# Patient Record
Sex: Male | Born: 1955 | Race: White | Hispanic: No | Smoking: Former smoker
Health system: Southern US, Community
[De-identification: ages and names within clinical notes are randomized; demographics above are authoritative.]

## PROBLEM LIST (undated history)

## (undated) DIAGNOSIS — I2510 Atherosclerotic heart disease of native coronary artery without angina pectoris: Secondary | ICD-10-CM

## (undated) DIAGNOSIS — I69398 Other sequelae of cerebral infarction: Secondary | ICD-10-CM

## (undated) DIAGNOSIS — E785 Hyperlipidemia, unspecified: Secondary | ICD-10-CM

## (undated) DIAGNOSIS — I10 Essential (primary) hypertension: Secondary | ICD-10-CM

## (undated) DIAGNOSIS — R4701 Aphasia: Secondary | ICD-10-CM

## (undated) DIAGNOSIS — I639 Cerebral infarction, unspecified: Secondary | ICD-10-CM

## (undated) DIAGNOSIS — J449 Chronic obstructive pulmonary disease, unspecified: Secondary | ICD-10-CM

## (undated) DIAGNOSIS — I69390 Apraxia following cerebral infarction: Secondary | ICD-10-CM

## (undated) DIAGNOSIS — H53469 Homonymous bilateral field defects, unspecified side: Secondary | ICD-10-CM

## (undated) HISTORY — DX: Hyperlipidemia, unspecified: E78.5

## (undated) HISTORY — PX: APPENDECTOMY: SHX54

## (undated) HISTORY — PX: LUMBAR SPINE SURGERY: SHX701

---

## 1999-05-19 DIAGNOSIS — I2510 Atherosclerotic heart disease of native coronary artery without angina pectoris: Secondary | ICD-10-CM

## 1999-05-19 HISTORY — DX: Atherosclerotic heart disease of native coronary artery without angina pectoris: I25.10

## 1999-05-19 HISTORY — PX: CORONARY ARTERY BYPASS GRAFT: SHX141

## 2012-11-09 ENCOUNTER — Ambulatory Visit: Payer: Medicare Other | Attending: Internal Medicine | Admitting: Speech Pathology

## 2012-11-09 DIAGNOSIS — IMO0001 Reserved for inherently not codable concepts without codable children: Secondary | ICD-10-CM | POA: Insufficient documentation

## 2012-11-09 DIAGNOSIS — R4701 Aphasia: Secondary | ICD-10-CM | POA: Insufficient documentation

## 2012-11-14 ENCOUNTER — Ambulatory Visit: Payer: Medicare Other

## 2012-11-16 ENCOUNTER — Ambulatory Visit: Payer: Medicare Other | Attending: Internal Medicine

## 2012-11-16 DIAGNOSIS — R4701 Aphasia: Secondary | ICD-10-CM | POA: Insufficient documentation

## 2012-11-16 DIAGNOSIS — IMO0001 Reserved for inherently not codable concepts without codable children: Secondary | ICD-10-CM | POA: Insufficient documentation

## 2012-11-22 ENCOUNTER — Ambulatory Visit: Payer: Medicare Other

## 2012-11-29 ENCOUNTER — Ambulatory Visit: Payer: Medicare Other

## 2012-12-01 ENCOUNTER — Ambulatory Visit: Payer: Medicare Other

## 2012-12-05 ENCOUNTER — Ambulatory Visit: Payer: Medicare Other

## 2012-12-07 ENCOUNTER — Ambulatory Visit: Payer: Medicare Other

## 2012-12-12 ENCOUNTER — Ambulatory Visit: Payer: Medicare Other | Admitting: Speech Pathology

## 2012-12-14 ENCOUNTER — Ambulatory Visit: Payer: Medicare Other | Admitting: Speech Pathology

## 2012-12-19 ENCOUNTER — Ambulatory Visit: Payer: Medicare Other | Admitting: Occupational Therapy

## 2012-12-19 ENCOUNTER — Ambulatory Visit: Payer: Medicare Other | Admitting: Physical Therapy

## 2012-12-19 ENCOUNTER — Ambulatory Visit: Payer: Medicare Other | Attending: Internal Medicine

## 2012-12-19 DIAGNOSIS — IMO0001 Reserved for inherently not codable concepts without codable children: Secondary | ICD-10-CM | POA: Insufficient documentation

## 2012-12-19 DIAGNOSIS — R4701 Aphasia: Secondary | ICD-10-CM | POA: Insufficient documentation

## 2012-12-21 ENCOUNTER — Ambulatory Visit: Payer: Medicare Other

## 2012-12-26 ENCOUNTER — Ambulatory Visit: Payer: Medicare Other

## 2012-12-26 ENCOUNTER — Ambulatory Visit: Payer: Medicare Other | Admitting: Occupational Therapy

## 2012-12-26 ENCOUNTER — Ambulatory Visit: Payer: Medicare Other | Admitting: Physical Therapy

## 2012-12-28 ENCOUNTER — Ambulatory Visit: Payer: Medicare Other

## 2012-12-28 ENCOUNTER — Ambulatory Visit: Payer: Medicare Other | Admitting: Occupational Therapy

## 2013-01-02 ENCOUNTER — Encounter: Payer: Medicare Other | Admitting: Occupational Therapy

## 2013-01-02 ENCOUNTER — Encounter: Payer: Medicare Other | Admitting: Speech Pathology

## 2013-01-04 ENCOUNTER — Encounter: Payer: Medicare Other | Admitting: Occupational Therapy

## 2013-01-04 ENCOUNTER — Ambulatory Visit: Payer: Medicare Other | Admitting: Physical Therapy

## 2013-01-04 ENCOUNTER — Encounter: Payer: Medicare Other | Admitting: Speech Pathology

## 2013-01-09 ENCOUNTER — Encounter: Payer: Medicare Other | Admitting: Speech Pathology

## 2013-01-09 ENCOUNTER — Ambulatory Visit: Payer: Medicare Other | Admitting: Physical Therapy

## 2013-01-10 ENCOUNTER — Encounter: Payer: Medicare Other | Admitting: Occupational Therapy

## 2013-01-11 ENCOUNTER — Encounter: Payer: Medicare Other | Admitting: Speech Pathology

## 2013-01-11 ENCOUNTER — Ambulatory Visit: Payer: Medicare Other | Admitting: Physical Therapy

## 2013-01-12 ENCOUNTER — Encounter: Payer: Medicare Other | Admitting: Occupational Therapy

## 2013-01-17 ENCOUNTER — Encounter: Payer: Medicare Other | Admitting: Occupational Therapy

## 2013-01-19 ENCOUNTER — Encounter: Payer: Medicare Other | Admitting: Occupational Therapy

## 2013-01-24 ENCOUNTER — Encounter: Payer: Medicare Other | Admitting: Occupational Therapy

## 2013-01-26 ENCOUNTER — Encounter: Payer: Medicare Other | Admitting: Occupational Therapy

## 2013-01-31 ENCOUNTER — Encounter: Payer: Medicare Other | Admitting: Occupational Therapy

## 2013-02-02 ENCOUNTER — Encounter: Payer: Medicare Other | Admitting: Occupational Therapy

## 2013-11-23 ENCOUNTER — Ambulatory Visit: Payer: Medicare Other | Attending: Internal Medicine | Admitting: Occupational Therapy

## 2013-11-23 DIAGNOSIS — IMO0001 Reserved for inherently not codable concepts without codable children: Secondary | ICD-10-CM | POA: Diagnosis not present

## 2013-11-23 DIAGNOSIS — M25649 Stiffness of unspecified hand, not elsewhere classified: Secondary | ICD-10-CM | POA: Diagnosis not present

## 2013-11-23 DIAGNOSIS — I69998 Other sequelae following unspecified cerebrovascular disease: Secondary | ICD-10-CM | POA: Insufficient documentation

## 2013-11-23 DIAGNOSIS — M62838 Other muscle spasm: Secondary | ICD-10-CM | POA: Diagnosis not present

## 2013-11-23 DIAGNOSIS — I69920 Aphasia following unspecified cerebrovascular disease: Secondary | ICD-10-CM | POA: Insufficient documentation

## 2013-11-28 ENCOUNTER — Ambulatory Visit: Payer: Medicare Other | Admitting: Occupational Therapy

## 2014-06-06 ENCOUNTER — Ambulatory Visit: Payer: Medicare Other | Attending: Occupational Therapy | Admitting: Occupational Therapy

## 2014-06-20 ENCOUNTER — Ambulatory Visit: Payer: Medicare Other | Attending: Internal Medicine | Admitting: Occupational Therapy

## 2014-06-20 DIAGNOSIS — R258 Other abnormal involuntary movements: Secondary | ICD-10-CM | POA: Diagnosis present

## 2014-06-20 DIAGNOSIS — R252 Cramp and spasm: Secondary | ICD-10-CM

## 2014-06-20 NOTE — Therapy (Signed)
[facility] [address] [place], [place], [address] Phone: [phone]   Fax:  [phone]  Occupational Therapy Evaluation  Patient Details  Name: Daniel JacobKirk Oconnell MRN: [id number] Date of Birth: November 12, 1955 Referring Provider:  No ref. provider found  Encounter Date: 06/20/2014      OT End of Session - 06/20/14 1308    Visit Number 1  one time visit    Authorization Type Blue MCR   OT Start Time 1150   OT Stop Time 1250   OT Time Calculation (min) 60 min   Activity Tolerance Patient tolerated treatment well      No past medical history on file.  No past surgical history on file.  There were no vitals taken for this visit.  Visit Diagnosis:  Spasticity      Subjective Assessment - 06/20/14 1201    Pertinent History CVA 2002   Currently in Pain? No/denies          [facility] - 06/20/14 0001    Assessment   Diagnosis CVA 2002   Prior Therapy Outpatient therapy   Precautions   Precautions --  spasticity, severe expressive aphasia   Home  Environment   [facility]   ADL   ADL comments Pt performs BADLS and does own laundry Modified independent level. Pt's meals are provided and pt receives assist for medication management        OT Treatment: pt was seen today for fabrication and fitting of new resting hand splint for Rt hand. Pt issued splint and provided w/ stockinette and extra padding for finger straps.  Pt/caregiver present for education.                 OT Education - 06/20/14 1307    Education provided Yes   Education Details SPLINT WEAR AND CARE (resting hand splint)   Person(s) Educated Patient;Caregiver(s)   Methods Explanation;Demonstration;Handout   Comprehension Verbalized understanding             OT Long Term Goals - 06/20/14 1312    OT LONG TERM GOAL #1   Title Pt/caregiver independent w/ splint wear and care   Status  Achieved               Plan - 06/20/14 1308    Clinical Impression Statement Pt is a 59 y.o. male who presents to outpatient rehab today with MD orders to replace broken splint for Rt arm (resting hand splint). This is the 3rd resting hand splint fabricated for this patient. Pt's splints break approx. every 6 months due to spasticity.    OT Frequency One time visit  eval and splinting only    OT Treatment/Interventions Splinting;Other (comment)  pt/caregiver education   Plan eval and splinting only. Pt may return for splinting adjustments only in this episode of care        Problem List There are no active problems to display for this patient.   Kelli ChurnBallie, Mujtaba Bollig Johnson, OTR/L 06/20/2014, 1:18 PM  [facility] [address] [place], [place], [address] Phone: [phone]   Fax:  [phone]

## 2015-05-22 ENCOUNTER — Ambulatory Visit: Payer: Medicare HMO | Attending: Family Medicine | Admitting: Occupational Therapy

## 2015-05-22 DIAGNOSIS — I69359 Hemiplegia and hemiparesis following cerebral infarction affecting unspecified side: Secondary | ICD-10-CM | POA: Diagnosis present

## 2015-05-22 NOTE — Therapy (Signed)
[facility] [address] [place], [place], [address] Phone: [phone]   Fax:  [phone]  Occupational Therapy Evaluation  Patient Details  Name: Hedy JacobKirk Sadlon MRN: [id number] Date of Birth: [birth year] Referring Provider: Arlyss RepressLisa Williams-Corbin  Encounter Date: 05/22/2015      OT End of Session - 05/22/15 1253    Visit Number 1   Number of Visits 3   Date for OT Re-Evaluation 07/19/15   Authorization Type BLUE MCR   OT Start Time 1145   OT Stop Time 1245   OT Time Calculation (min) 60 min   Activity Tolerance Patient tolerated treatment well      No past medical history on file.  No past surgical history on file.  There were no vitals filed for this visit.  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 No/denies           [facility] Assessment - 05/22/15 0001    Assessment   Diagnosis Old CVA   Referring Provider Arlyss RepressLisa Williams-Corbin   Onset Date --  2006   Assessment Pt returns today for new resting hand splint with no new changes   Precautions   Precautions None  Other than 24 hr. care/assist due to old CVA -severe aphasia   Balance Screen   Has the patient fallen in the past 6 months No   Has the patient had a decrease in activity level because of a fear of falling?  No   Is the patient reluctant to leave their home because of a fear of falling?  No   Home  Environment   Additional Comments [facility]   Prior Function   Level of Independence Independent with basic ADLs  all IADLS provided thru [facility]   ADL   ADL comments independent with BADLS, Assisted Living provides all IADLS   Written Expression   Dominant Hand Left  since CVA in 2006 (was Rt handed prior to CVA)                  OT Treatments/Exercises (OP) - 05/22/15 0001    Splinting   Splinting Fabricated and fitted new resting hand splint per MD order and pt request. Issued splint. Reviewed wear and care with pt/caregiver               OT Education - 05/22/15 1252    Education provided Yes   Education Details Review of wear and care   Person(s) Educated Patient;Caregiver(s)   Methods Explanation   Comprehension Verbalized understanding             OT Long Term Goals - 05/22/15 1257    OT LONG TERM GOAL #1   Title Pt/caregiver independent w/ splint wear and care   Time 8   Period Weeks   Status On-going               Plan - 05/22/15 1254    Clinical Impression Statement Pt is a 60 y.o. 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 Pt returns today for splinting purposes only to replace broken resting hand splint   Pt  will benefit from skilled therapeutic intervention in order to improve on the following deficits (Retired) Impaired tone;Impaired UE functional use   Rehab Potential Good   OT Frequency --  allowed 3 visits for splinting adjustments prn   OT Treatment/Interventions Patient/family education;Splinting   Plan splinting adjustments prn, if pt doesn't return within 60 days will d/c episode of care   Consulted and Agree with Plan of Care Patient;Family member/caregiver          G-Codes - 06/01/15 1258    Functional Assessment Tool Used Requires new resting hand splint for better positioning of Rt hand - issued today   Functional Limitation Changing and maintaining body position   Changing and Maintaining Body Position Current Status (B1478) At least 20 percent but less than 40 percent impaired, limited or restricted   Changing and Maintaining Body Position Goal Status (G9562) At least 1 percent but less than 20 percent impaired, limited or  restricted   Changing and Maintaining Body Position Discharge Status (Z3086) At least 1 percent but less than 20 percent impaired, limited or restricted      Problem List There are no active problems to display for this patient.   Kelli Churn, OTR/L 2015-06-01, 1:09 PM  [facility] [address] [place], [place], [address] Phone: [phone]   Fax:  [phone]  Name: Haig Gerardo MRN: [id number] Date of Birth: [birth year]

## 2016-04-14 ENCOUNTER — Emergency Department (HOSPITAL_COMMUNITY)
Admission: EM | Admit: 2016-04-14 | Discharge: 2016-04-15 | Disposition: A | Discharge status: Home / Self Care | Payer: Medicare HMO | Source: Home / Self Care | Attending: Emergency Medicine | Admitting: Emergency Medicine

## 2016-04-14 ENCOUNTER — Emergency Department (HOSPITAL_COMMUNITY): Payer: Medicare HMO

## 2016-04-14 ENCOUNTER — Encounter (HOSPITAL_COMMUNITY): Payer: Self-pay | Admitting: Emergency Medicine

## 2016-04-14 DIAGNOSIS — Z8673 Personal history of transient ischemic attack (TIA), and cerebral infarction without residual deficits: Secondary | ICD-10-CM | POA: Insufficient documentation

## 2016-04-14 DIAGNOSIS — Z87891 Personal history of nicotine dependence: Secondary | ICD-10-CM | POA: Insufficient documentation

## 2016-04-14 DIAGNOSIS — R0602 Shortness of breath: Secondary | ICD-10-CM | POA: Diagnosis not present

## 2016-04-14 DIAGNOSIS — I10 Essential (primary) hypertension: Secondary | ICD-10-CM

## 2016-04-14 DIAGNOSIS — R1031 Right lower quadrant pain: Secondary | ICD-10-CM

## 2016-04-14 DIAGNOSIS — Z79899 Other long term (current) drug therapy: Secondary | ICD-10-CM | POA: Insufficient documentation

## 2016-04-14 DIAGNOSIS — E860 Dehydration: Secondary | ICD-10-CM

## 2016-04-14 DIAGNOSIS — Z7982 Long term (current) use of aspirin: Secondary | ICD-10-CM

## 2016-04-14 DIAGNOSIS — J189 Pneumonia, unspecified organism: Secondary | ICD-10-CM | POA: Diagnosis not present

## 2016-04-14 HISTORY — DX: Cerebral infarction, unspecified: I63.9

## 2016-04-14 HISTORY — DX: Essential (primary) hypertension: I10

## 2016-04-14 LAB — COMPREHENSIVE METABOLIC PANEL
ALBUMIN: 3.8 g/dL (ref 3.5–5.0)
ALT: 18 U/L (ref 17–63)
ANION GAP: 9 (ref 5–15)
AST: 18 U/L (ref 15–41)
Alkaline Phosphatase: 69 U/L (ref 38–126)
BILIRUBIN TOTAL: 1.2 mg/dL (ref 0.3–1.2)
BUN: 17 mg/dL (ref 6–20)
CO2: 23 mmol/L (ref 22–32)
Calcium: 8.5 mg/dL — ABNORMAL LOW (ref 8.9–10.3)
Chloride: 106 mmol/L (ref 101–111)
Creatinine, Ser: 1.08 mg/dL (ref 0.61–1.24)
GFR calc non Af Amer: >60 mL/min (ref >60)
GLUCOSE: 103 mg/dL — AB (ref 65–99)
POTASSIUM: 3.4 mmol/L — AB (ref 3.5–5.1)
Sodium: 138 mmol/L (ref 135–145)
TOTAL PROTEIN: 6.7 g/dL (ref 6.5–8.1)

## 2016-04-14 LAB — CBC WITH DIFFERENTIAL/PLATELET
BASOS PCT: 0 %
Basophils Absolute: 0 10*3/uL (ref 0.0–0.1)
EOS ABS: 0.1 10*3/uL (ref 0.0–0.7)
Eosinophils Relative: 0 %
HEMATOCRIT: 41.4 % (ref 39.0–52.0)
Hemoglobin: 14.5 g/dL (ref 13.0–17.0)
Lymphocytes Relative: 11 %
Lymphs Abs: 1.6 10*3/uL (ref 0.7–4.0)
MCH: 29.2 pg (ref 26.0–34.0)
MCHC: 35 g/dL (ref 30.0–36.0)
MCV: 83.3 fL (ref 78.0–100.0)
MONO ABS: 1.4 10*3/uL — AB (ref 0.1–1.0)
MONOS PCT: 9 %
NEUTROS ABS: 11.6 10*3/uL — AB (ref 1.7–7.7)
Neutrophils Relative %: 80 %
Platelets: 199 10*3/uL (ref 150–400)
RBC: 4.97 MIL/uL (ref 4.22–5.81)
RDW: 13.9 % (ref 11.5–15.5)
WBC: 14.6 10*3/uL — ABNORMAL HIGH (ref 4.0–10.5)

## 2016-04-14 LAB — LIPASE, BLOOD: LIPASE: 18 U/L (ref 11–51)

## 2016-04-14 MED ORDER — IOPAMIDOL (ISOVUE-300) INJECTION 61%: 100.0000 mL | Freq: Once | INTRAVENOUS | Status: DC | PRN

## 2016-04-14 NOTE — ED Triage Notes (Signed)
[facility] with c/o lower abdominal pain, onset 3 days ago.  Pt describes pain as "wave-like", that comes and goes.  Pt denies nausea or vomiting.  Pt's last BM last Saturday.   Has hx of stroke with aphasia.

## 2016-04-14 NOTE — ED Notes (Signed)
Bed: WA09 Expected date:  Expected time:  Means of arrival:  Comments: 60 yr old, abd pain for three days

## 2016-04-14 NOTE — ED Provider Notes (Signed)
[facility]-EMERGENCY DEPT Provider Note   CSN: [id number] Arrival date & time: 04/14/16  2137  By signing my name below, I, Modena Jansky, attest that this documentation has been prepared under the direction and in the presence of non-physician practitioner, Antony Madura, PA-C. Electronically Signed: Modena Jansky, Scribe. 04/14/2016. 10:12 PM.   History   Chief Complaint Chief Complaint  Patient presents with  . Abdominal Pain   The history is provided by the patient. No language interpreter was used.  Abdominal Pain   This is a new problem. The current episode started more than 2 days ago. The problem occurs constantly. The problem has not changed since onset.The pain is located in the RLQ. The pain is moderate. Associated symptoms include constipation. Pertinent negatives include vomiting and dysuria. Nothing relieves the symptoms. Past workup does not include surgery.    Pt's hx is slightly limited by pt's baseline speech impairment.  HPI Comments: Daniel Oconnell is a 60 y.o. male with a hx of CVA with aphasia who presents to the Emergency Department complaining of intermittent moderate lower abdominal pain that started 3 days ago. He describes the the pain as non-radiating. He reports associated symptoms of constipation and subjective fever. Pt's temperature in the ED today was 98.7. He admits to a hx of kidney stones, which did not feel similar to today's pain. He denies any past known abdominal surgeries, vomiting, nausea, blood in stool, constipation, dysuria, SOB, chest pain, or other complaints. Family later reports a hx of appendectomy.   Past Medical History:  Diagnosis Date  . CVA (cerebral vascular accident) (HCC)   . Hypertension   . Impaired speech     There are no active problems to display for this patient.   History reviewed. No pertinent surgical history.   Home Medications    Prior to Admission medications   Medication Sig Start Date End Date Taking? Authorizing  Provider  acetaminophen (TYLENOL) 500 MG tablet Take 500 mg by mouth every 6 (six) hours as needed.    Historical Provider, MD  amLODipine (NORVASC) 10 MG tablet Take 10 mg by mouth daily.    Historical Provider, MD  aspirin 325 MG EC tablet Take 325 mg by mouth daily.    Historical Provider, MD  atorvastatin (LIPITOR) 80 MG tablet Take 80 mg by mouth daily.    Historical Provider, MD  cetirizine (ZYRTEC) 10 MG tablet Take 10 mg by mouth daily.    Historical Provider, MD  clopidogrel (PLAVIX) 75 MG tablet Take 75 mg by mouth daily.    Historical Provider, MD  dicyclomine (BENTYL) 20 MG tablet Take 1 tablet (20 mg total) by mouth 2 (two) times daily. Take for abdominal pain/cramping 04/15/16   Antony Madura, PA-C  hydrALAZINE (APRESOLINE) 10 MG tablet Take 10 mg by mouth 3 (three) times daily.    Historical Provider, MD  hydrOXYzine (ATARAX/VISTARIL) 25 MG tablet Take 25 mg by mouth 3 (three) times daily as needed.    Historical Provider, MD  ketoconazole (NIZORAL) 2 % cream Apply 1 application topically daily.    Historical Provider, MD  labetalol (NORMODYNE) 200 MG tablet Take 200 mg by mouth 2 (two) times daily.    Historical Provider, MD  lactobacillus acidophilus & bulgar (LACTINEX) chewable tablet Chew 1 tablet by mouth 3 (three) times daily with meals.    Historical Provider, MD  levETIRAcetam (KEPPRA) 750 MG tablet Take 750 mg by mouth 2 (two) times daily.    Historical Provider, MD  lisinopril (PRINIVIL,ZESTRIL) 20  MG tablet Take 20 mg by mouth daily.    Historical Provider, MD  loperamide (IMODIUM) 2 MG capsule Take by mouth as needed for diarrhea or loose stools.    Historical Provider, MD  ondansetron (ZOFRAN ODT) 4 MG disintegrating tablet Take 1 tablet (4 mg total) by mouth every 8 (eight) hours as needed for nausea or vomiting. 04/15/16   Antony MaduraKelly Jaquan Sadowsky, PA-C  polyethylene glycol powder (GLYCOLAX/MIRALAX) powder Take 17 g by mouth 2 (two) times daily. Use for constipation, as prescribed,  until daily soft stools  OTC 04/15/16   Antony MaduraKelly Cherylin Waguespack, PA-C  polyvinyl alcohol (LIQUIFILM TEARS) 1.4 % ophthalmic solution 1 drop as needed for dry eyes.    Historical Provider, MD  sertraline (ZOLOFT) 100 MG tablet Take 100 mg by mouth daily.    Historical Provider, MD  tiotropium (SPIRIVA) 18 MCG inhalation capsule Place 18 mcg into inhaler and inhale daily.    Historical Provider, MD  triamcinolone cream (KENALOG) 0.1 % Apply 1 application topically 2 (two) times daily.    Historical Provider, MD  vitamin B-12 (CYANOCOBALAMIN) 1000 MCG tablet Take 1,000 mcg by mouth daily.    Historical Provider, MD  zolpidem (AMBIEN CR) 12.5 MG CR tablet Take 12.5 mg by mouth at bedtime as needed for sleep.    Historical Provider, MD    Family History History reviewed. No pertinent family history.  Social History Social History  Substance Use Topics  . Smoking status: Former Games developermoker  . Smokeless tobacco: Never Used  . Alcohol use Yes     Allergies   Patient has no known allergies.   Review of Systems Review of Systems  Respiratory: Negative for shortness of breath.   Cardiovascular: Negative for chest pain.  Gastrointestinal: Positive for abdominal pain (Lower) and constipation. Negative for blood in stool and vomiting.  Genitourinary: Negative for dysuria.  Neurological: Positive for speech difficulty (Baseline).  All other systems reviewed and are negative.    Physical Exam Updated Vital Signs BP 128/82 (BP Location: Right Arm)   Pulse 61   Temp 98.7 F (37.1 C) (Oral)   Resp 18   Wt 180 lb (81.6 kg)   SpO2 94%   Physical Exam  Constitutional: He is oriented to person, place, and time. He appears well-developed and well-nourished. No distress.  Nontoxic and in no distress  HENT:  Head: Normocephalic and atraumatic.  Eyes: Conjunctivae and EOM are normal. No scleral icterus.  Neck: Normal range of motion.  Cardiovascular: Normal rate, regular rhythm and intact distal pulses.     Pulmonary/Chest: Effort normal. No respiratory distress. He has no wheezes. He has no rales.  Lungs clear to auscultation bilaterally. Chest expansion symmetric.  Abdominal: Soft. He exhibits no distension and no mass. There is tenderness. There is no guarding.  Minimal right lower quadrant tenderness. No voluntary or involuntary guarding. No masses, distention, or rigidity. No peritoneal signs.  Musculoskeletal: Normal range of motion.  Neurological: He is alert and oriented to person, place, and time. He exhibits normal muscle tone. Coordination normal.  Skin: Skin is warm and dry. No rash noted. He is not diaphoretic. No erythema. No pallor.  Psychiatric: He has a normal mood and affect. His behavior is normal. His speech is slurred (baseline aphasia).  Nursing note and vitals reviewed.    ED Treatments / Results  DIAGNOSTIC STUDIES: Oxygen Saturation is 94% on 2L/min O2, normal by my interpretation.    COORDINATION OF CARE: 10:16 PM- Pt advised of plan for treatment  and pt agrees.  Labs (all labs ordered are listed, but only abnormal results are displayed) Labs Reviewed  CBC WITH DIFFERENTIAL/PLATELET - Abnormal; Notable for the following:       Result Value   WBC 14.6 (*)    Neutro Abs 11.6 (*)    Monocytes Absolute 1.4 (*)    All other components within normal limits  COMPREHENSIVE METABOLIC PANEL - Abnormal; Notable for the following:    Potassium 3.4 (*)    Glucose, Bld 103 (*)    Calcium 8.5 (*)    All other components within normal limits  URINALYSIS, ROUTINE W REFLEX MICROSCOPIC (NOT AT [facility]) - Abnormal; Notable for the following:    Color, Urine AMBER (*)    Specific Gravity, Urine 1.031 (*)    Bilirubin Urine SMALL (*)    Protein, ur 30 (*)    All other components within normal limits  URINE MICROSCOPIC-ADD ON - Abnormal; Notable for the following:    Squamous Epithelial / LPF 0-5 (*)    Bacteria, UA FEW (*)    All other components within normal limits  URINE  CULTURE  LIPASE, BLOOD    EKG  EKG Interpretation None       Radiology Ct Abdomen Pelvis W Contrast  Result Date: 04/15/2016 CLINICAL DATA:  Acute onset of lower abdominal pain. Initial encounter. EXAM: CT ABDOMEN AND PELVIS WITH CONTRAST TECHNIQUE: Multidetector CT imaging of the abdomen and pelvis was performed using the standard protocol following bolus administration of intravenous contrast. CONTRAST:  100 mL of Isovue 300 IV contrast COMPARISON:  None. FINDINGS: Lower chest: Minimal bibasilar scarring or atelectasis is noted. The patient is status post median sternotomy. Diffuse coronary artery calcifications are seen. Hepatobiliary: The liver is unremarkable in appearance. The gallbladder is unremarkable in appearance. The common bile duct remains normal in caliber. Pancreas: The pancreas is within normal limits. Spleen: The spleen is unremarkable in appearance. Adrenals/Urinary Tract: Mild nodularity is noted at the adrenal glands bilaterally, nonspecific in appearance. The kidneys are within normal limits. There is no evidence of hydronephrosis. No renal or ureteral stones are identified. Nonspecific perinephric stranding is noted bilaterally. Stomach/Bowel: The stomach is unremarkable in appearance. The small bowel is within normal limits. The appendix is not visualized; there is no evidence for appendicitis. The colon is unremarkable in appearance. Vascular/Lymphatic: Diffuse calcification is seen along the abdominal aorta and its branches. There appears to be moderate to severe luminal narrowing at the common iliac arteries bilaterally. The inferior vena cava is grossly unremarkable. No retroperitoneal lymphadenopathy is seen. No pelvic sidewall lymphadenopathy is identified. Reproductive: Mild soft tissue inflammation about the bladder could reflect cystitis. The prostate is enlarged, measuring 5.1 cm in transverse dimension, with minimal calcification. Other: No additional soft tissue  abnormalities are seen. Musculoskeletal: No acute osseous abnormalities are identified. Vacuum phenomenon is noted at L5-S1. The visualized musculature is unremarkable in appearance. IMPRESSION: 1. Mild soft tissue inflammation about the bladder could reflect cystitis. 2. Diffuse aortic atherosclerosis. Moderate severe luminal narrowing noted at the common iliac arteries bilaterally. 3. Enlarged prostate noted. 4. **An incidental finding of potential clinical significance has been found. Mild nodularity of the adrenal glands bilaterally, nonspecific in appearance. Would correlate with adrenal labs, and consider adrenal protocol MRI or CT for further evaluation.** 5. Diffuse coronary artery calcifications seen. Electronically Signed   By: Roanna RaiderJeffery  Chang M.D.   On: 04/15/2016 00:21    Procedures Procedures (including critical care time)  Medications Ordered in ED Medications  iopamidol (ISOVUE-300) 61 % injection 100 mL (not administered)  sodium chloride 0.9 % bolus 1,000 mL (0 mLs Intravenous Stopped 04/15/16 0245)  ketorolac (TORADOL) 30 MG/ML injection 30 mg (30 mg Intravenous Given 04/15/16 0235)     Initial Impression / Assessment and Plan / ED Course  I have reviewed the triage vital signs and the nursing notes.  Pertinent labs & imaging results that were available during my care of the patient were reviewed by me and considered in my medical decision making (see chart for details).  Clinical Course     60 year old pleasant and nontoxic appearing male presents to the emergency department for evaluation of abdominal pain. Patient points to his right lower quadrant when asked about the location of his symptoms. Family does report a history of appendectomy. Patient initially reporting diarrhea, but later stating that he has had constipation for the past 3 days. Laboratory workup significant for mild leukocytosis which is nonspecific. I suspect that this may be due to a viral illness as  patient's abdominal CT is reassuring and without acute abdominopelvic process. There is mild bladder wall thickening, but no evidence of UTI on urinalysis. Will send urine for culture.  Patient has been fairly comfortable while in the emergency department. He initially declined pain medication and later received Toradol for persistent symptoms. I believe he is stable for discharge with further management on an outpatient basis. Primary care follow up advised and return precautions given. Patient discharged in stable condition; family and patient with no unaddressed concerns.   Final Clinical Impressions(s) / ED Diagnoses   Final diagnoses:  Right lower quadrant abdominal pain  Dehydration    New Prescriptions Discharge Medication List as of 04/15/2016  2:31 AM    START taking these medications   Details  dicyclomine (BENTYL) 20 MG tablet Take 1 tablet (20 mg total) by mouth 2 (two) times daily. Take for abdominal pain/cramping, Starting Wed 04/15/2016, Print    ondansetron (ZOFRAN ODT) 4 MG disintegrating tablet Take 1 tablet (4 mg total) by mouth every 8 (eight) hours as needed for nausea or vomiting., Starting Wed 04/15/2016, Print    polyethylene glycol powder (GLYCOLAX/MIRALAX) powder Take 17 g by mouth 2 (two) times daily. Use for constipation, as prescribed, until daily soft stools  OTC, Starting Wed 04/15/2016, Print       I personally performed the services described in this documentation, which was scribed in my presence. The recorded information has been reviewed and is accurate.       Antony Madura, PA-C 04/15/16 1610    Vanetta Mulders, MD 04/16/16 [phone]

## 2016-04-15 LAB — URINALYSIS, ROUTINE W REFLEX MICROSCOPIC
GLUCOSE, UA: NEGATIVE mg/dL
HGB URINE DIPSTICK: NEGATIVE
Ketones, ur: NEGATIVE mg/dL
LEUKOCYTES UA: NEGATIVE
Nitrite: NEGATIVE
PROTEIN: 30 mg/dL — AB
SPECIFIC GRAVITY, URINE: 1.031 — AB (ref 1.005–1.030)
pH: 5.5 (ref 5.0–8.0)

## 2016-04-15 LAB — URINE MICROSCOPIC-ADD ON

## 2016-04-15 MED ORDER — SODIUM CHLORIDE 0.9 % IV BOLUS (SEPSIS)
1000.0000 mL | Freq: Once | INTRAVENOUS | Status: AC
  Administered 2016-04-15: 1000 mL via INTRAVENOUS

## 2016-04-15 MED ORDER — DICYCLOMINE HCL 20 MG PO TABS: 20.0000 mg | ORAL_TABLET | Freq: Two times a day (BID) | ORAL | 0 refills | Status: DC

## 2016-04-15 MED ORDER — ONDANSETRON 4 MG PO TBDP: 4.0000 mg | ORAL_TABLET | Freq: Three times a day (TID) | ORAL | 0 refills | Status: DC | PRN

## 2016-04-15 MED ORDER — KETOROLAC TROMETHAMINE 30 MG/ML IJ SOLN
30.0000 mg | Freq: Once | INTRAMUSCULAR | Status: AC
  Administered 2016-04-15: 30 mg via INTRAVENOUS
  Filled 2016-04-15: qty 1

## 2016-04-15 MED ORDER — POLYETHYLENE GLYCOL 3350 17 GM/SCOOP PO POWD: 17.0000 g | Freq: Two times a day (BID) | ORAL | 0 refills | Status: AC

## 2016-04-15 NOTE — Discharge Instructions (Signed)
Take Bentyl for abdominal pain, as needed, and Zofran for nausea. We advise that you use MiraLAX if you develop problems with constipation. Follow up with your primary care doctor to ensure resolution of symptoms.

## 2016-04-15 NOTE — ED Notes (Signed)
[facility] was called and was given report on pt's condition and discharge; discharge instructions and prescriptions were also given.

## 2016-04-16 ENCOUNTER — Inpatient Hospital Stay (HOSPITAL_COMMUNITY)
Admission: EM | Admit: 2016-04-16 | Discharge: 2016-04-22 | DRG: 193 | Disposition: A | Discharge status: Nursing Home (Includes ICF) | Payer: Medicare HMO | Attending: Family Medicine | Admitting: Family Medicine

## 2016-04-16 ENCOUNTER — Encounter (HOSPITAL_COMMUNITY): Payer: Self-pay | Admitting: Nurse Practitioner

## 2016-04-16 ENCOUNTER — Emergency Department (HOSPITAL_COMMUNITY): Payer: Medicare HMO

## 2016-04-16 DIAGNOSIS — R350 Frequency of micturition: Secondary | ICD-10-CM | POA: Diagnosis present

## 2016-04-16 DIAGNOSIS — J449 Chronic obstructive pulmonary disease, unspecified: Secondary | ICD-10-CM | POA: Diagnosis present

## 2016-04-16 DIAGNOSIS — Z66 Do not resuscitate: Secondary | ICD-10-CM | POA: Diagnosis present

## 2016-04-16 DIAGNOSIS — R339 Retention of urine, unspecified: Secondary | ICD-10-CM

## 2016-04-16 DIAGNOSIS — J439 Emphysema, unspecified: Secondary | ICD-10-CM | POA: Diagnosis present

## 2016-04-16 DIAGNOSIS — I69320 Aphasia following cerebral infarction: Secondary | ICD-10-CM | POA: Diagnosis not present

## 2016-04-16 DIAGNOSIS — Z951 Presence of aortocoronary bypass graft: Secondary | ICD-10-CM

## 2016-04-16 DIAGNOSIS — Q891 Congenital malformations of adrenal gland: Secondary | ICD-10-CM

## 2016-04-16 DIAGNOSIS — E874 Mixed disorder of acid-base balance: Secondary | ICD-10-CM | POA: Diagnosis present

## 2016-04-16 DIAGNOSIS — R1031 Right lower quadrant pain: Secondary | ICD-10-CM | POA: Diagnosis not present

## 2016-04-16 DIAGNOSIS — I2510 Atherosclerotic heart disease of native coronary artery without angina pectoris: Secondary | ICD-10-CM | POA: Diagnosis present

## 2016-04-16 DIAGNOSIS — H53469 Homonymous bilateral field defects, unspecified side: Secondary | ICD-10-CM | POA: Diagnosis present

## 2016-04-16 DIAGNOSIS — R471 Dysarthria and anarthria: Secondary | ICD-10-CM | POA: Diagnosis present

## 2016-04-16 DIAGNOSIS — E279 Disorder of adrenal gland, unspecified: Secondary | ICD-10-CM | POA: Diagnosis not present

## 2016-04-16 DIAGNOSIS — Z87891 Personal history of nicotine dependence: Secondary | ICD-10-CM | POA: Diagnosis not present

## 2016-04-16 DIAGNOSIS — N401 Enlarged prostate with lower urinary tract symptoms: Secondary | ICD-10-CM | POA: Diagnosis present

## 2016-04-16 DIAGNOSIS — Z7902 Long term (current) use of antithrombotics/antiplatelets: Secondary | ICD-10-CM | POA: Diagnosis not present

## 2016-04-16 DIAGNOSIS — J9601 Acute respiratory failure with hypoxia: Secondary | ICD-10-CM | POA: Diagnosis present

## 2016-04-16 DIAGNOSIS — R4701 Aphasia: Secondary | ICD-10-CM

## 2016-04-16 DIAGNOSIS — I10 Essential (primary) hypertension: Secondary | ICD-10-CM | POA: Diagnosis present

## 2016-04-16 DIAGNOSIS — J189 Pneumonia, unspecified organism: Principal | ICD-10-CM | POA: Diagnosis present

## 2016-04-16 DIAGNOSIS — Z8673 Personal history of transient ischemic attack (TIA), and cerebral infarction without residual deficits: Secondary | ICD-10-CM

## 2016-04-16 DIAGNOSIS — R0602 Shortness of breath: Secondary | ICD-10-CM | POA: Diagnosis present

## 2016-04-16 DIAGNOSIS — J441 Chronic obstructive pulmonary disease with (acute) exacerbation: Secondary | ICD-10-CM | POA: Diagnosis present

## 2016-04-16 DIAGNOSIS — Z79899 Other long term (current) drug therapy: Secondary | ICD-10-CM

## 2016-04-16 DIAGNOSIS — Y95 Nosocomial condition: Secondary | ICD-10-CM | POA: Diagnosis present

## 2016-04-16 DIAGNOSIS — R9431 Abnormal electrocardiogram [ECG] [EKG]: Secondary | ICD-10-CM | POA: Diagnosis present

## 2016-04-16 DIAGNOSIS — N400 Enlarged prostate without lower urinary tract symptoms: Secondary | ICD-10-CM

## 2016-04-16 HISTORY — DX: Aphasia: R47.01

## 2016-04-16 HISTORY — DX: Homonymous bilateral field defects, unspecified side: I69.398

## 2016-04-16 HISTORY — DX: Atherosclerotic heart disease of native coronary artery without angina pectoris: I25.10

## 2016-04-16 HISTORY — DX: Apraxia following cerebral infarction: I69.390

## 2016-04-16 HISTORY — DX: Chronic obstructive pulmonary disease, unspecified: J44.9

## 2016-04-16 HISTORY — DX: Other sequelae of cerebral infarction: H53.469

## 2016-04-16 LAB — BLOOD GAS, ARTERIAL
ACID-BASE DEFICIT: 0.9 mmol/L (ref 0.0–2.0)
Bicarbonate: 24 mmol/L (ref 20.0–28.0)
DRAWN BY: 44135
O2 CONTENT: 5 L/min
O2 SAT: 88.4 %
Patient temperature: 98.6
pCO2 arterial: 42.7 mmHg (ref 32.0–48.0)
pH, Arterial: 7.369 (ref 7.350–7.450)
pO2, Arterial: 59.4 mmHg — ABNORMAL LOW (ref 83.0–108.0)

## 2016-04-16 LAB — PROTIME-INR
INR: 1.2
Prothrombin Time: 15.3 seconds — ABNORMAL HIGH (ref 11.4–15.2)

## 2016-04-16 LAB — URINE MICROSCOPIC-ADD ON
BACTERIA UA: NONE SEEN
RBC / HPF: NONE SEEN RBC/hpf (ref 0–5)
SQUAMOUS EPITHELIAL / LPF: NONE SEEN

## 2016-04-16 LAB — COMPREHENSIVE METABOLIC PANEL
ALBUMIN: 3.5 g/dL (ref 3.5–5.0)
ALK PHOS: 60 U/L (ref 38–126)
ALT: 17 U/L (ref 17–63)
ANION GAP: 6 (ref 5–15)
AST: 18 U/L (ref 15–41)
BILIRUBIN TOTAL: 1.2 mg/dL (ref 0.3–1.2)
BUN: 14 mg/dL (ref 6–20)
CALCIUM: 8.3 mg/dL — AB (ref 8.9–10.3)
CO2: 29 mmol/L (ref 22–32)
Chloride: 104 mmol/L (ref 101–111)
Creatinine, Ser: 0.98 mg/dL (ref 0.61–1.24)
GFR calc non Af Amer: >60 mL/min (ref >60)
Glucose, Bld: 110 mg/dL — ABNORMAL HIGH (ref 65–99)
POTASSIUM: 3.5 mmol/L (ref 3.5–5.1)
SODIUM: 139 mmol/L (ref 135–145)
TOTAL PROTEIN: 6.3 g/dL — AB (ref 6.5–8.1)

## 2016-04-16 LAB — CBC WITH DIFFERENTIAL/PLATELET
BASOS ABS: 0 10*3/uL (ref 0.0–0.1)
BASOS PCT: 0 %
EOS ABS: 0 10*3/uL (ref 0.0–0.7)
Eosinophils Relative: 0 %
HEMATOCRIT: 39.2 % (ref 39.0–52.0)
HEMOGLOBIN: 13.4 g/dL (ref 13.0–17.0)
Lymphocytes Relative: 8 %
Lymphs Abs: 1 10*3/uL (ref 0.7–4.0)
MCH: 29 pg (ref 26.0–34.0)
MCHC: 34.2 g/dL (ref 30.0–36.0)
MCV: 84.8 fL (ref 78.0–100.0)
Monocytes Absolute: 1.2 10*3/uL — ABNORMAL HIGH (ref 0.1–1.0)
Monocytes Relative: 10 %
NEUTROS ABS: 9.3 10*3/uL — AB (ref 1.7–7.7)
NEUTROS PCT: 82 %
Platelets: 197 10*3/uL (ref 150–400)
RBC: 4.62 MIL/uL (ref 4.22–5.81)
RDW: 13.9 % (ref 11.5–15.5)
WBC: 11.5 10*3/uL — AB (ref 4.0–10.5)

## 2016-04-16 LAB — URINALYSIS, ROUTINE W REFLEX MICROSCOPIC
Glucose, UA: NEGATIVE mg/dL
Hgb urine dipstick: NEGATIVE
Ketones, ur: NEGATIVE mg/dL
LEUKOCYTES UA: NEGATIVE
NITRITE: NEGATIVE
Protein, ur: 30 mg/dL — AB
pH: 6 (ref 5.0–8.0)

## 2016-04-16 LAB — I-STAT TROPONIN, ED: TROPONIN I, POC: 0.01 ng/mL (ref 0.00–0.08)

## 2016-04-16 LAB — LIPASE, BLOOD: LIPASE: 22 U/L (ref 11–51)

## 2016-04-16 LAB — LACTIC ACID, PLASMA: Lactic Acid, Venous: 1.1 mmol/L (ref 0.5–1.9)

## 2016-04-16 LAB — URINE CULTURE

## 2016-04-16 LAB — PROCALCITONIN: Procalcitonin: <0.1 ng/mL

## 2016-04-16 LAB — APTT: APTT: 33 s (ref 24–36)

## 2016-04-16 LAB — BRAIN NATRIURETIC PEPTIDE: B NATRIURETIC PEPTIDE 5: 441.4 pg/mL — AB (ref 0.0–100.0)

## 2016-04-16 LAB — I-STAT CG4 LACTIC ACID, ED: Lactic Acid, Venous: 0.63 mmol/L (ref 0.5–1.9)

## 2016-04-16 LAB — MRSA PCR SCREENING: MRSA BY PCR: NEGATIVE

## 2016-04-16 MED ORDER — LACTINEX PO CHEW
1.0000 | CHEWABLE_TABLET | Freq: Every day | ORAL | Status: DC
  Administered 2016-04-17 – 2016-04-22 (×6): 1 via ORAL
  Filled 2016-04-16 (×8): qty 1

## 2016-04-16 MED ORDER — SERTRALINE HCL 50 MG PO TABS
100.0000 mg | ORAL_TABLET | Freq: Every day | ORAL | Status: DC
  Administered 2016-04-17 – 2016-04-22 (×6): 100 mg via ORAL
  Filled 2016-04-16 (×6): qty 2

## 2016-04-16 MED ORDER — SODIUM CHLORIDE 0.9 % IJ SOLN
INTRAMUSCULAR | Status: AC
  Filled 2016-04-16: qty 50

## 2016-04-16 MED ORDER — ZOLPIDEM TARTRATE 5 MG PO TABS: 5.0000 mg | ORAL_TABLET | Freq: Every evening | ORAL | Status: DC | PRN

## 2016-04-16 MED ORDER — IPRATROPIUM-ALBUTEROL 0.5-2.5 (3) MG/3ML IN SOLN
3.0000 mL | Freq: Once | RESPIRATORY_TRACT | Status: AC
  Administered 2016-04-16: 3 mL via RESPIRATORY_TRACT
  Filled 2016-04-16: qty 3

## 2016-04-16 MED ORDER — IOPAMIDOL (ISOVUE-370) INJECTION 76%
100.0000 mL | Freq: Once | INTRAVENOUS | Status: AC | PRN
Start: 2016-04-16 — End: 2016-04-16
  Administered 2016-04-16: 100 mL via INTRAVENOUS

## 2016-04-16 MED ORDER — CLOPIDOGREL BISULFATE 75 MG PO TABS
75.0000 mg | ORAL_TABLET | Freq: Every day | ORAL | Status: DC
  Administered 2016-04-17 – 2016-04-22 (×6): 75 mg via ORAL
  Filled 2016-04-16 (×6): qty 1

## 2016-04-16 MED ORDER — HYDRALAZINE HCL 50 MG PO TABS
100.0000 mg | ORAL_TABLET | Freq: Two times a day (BID) | ORAL | Status: DC
  Administered 2016-04-16 – 2016-04-22 (×11): 100 mg via ORAL
  Filled 2016-04-16 (×12): qty 2

## 2016-04-16 MED ORDER — ALBUTEROL SULFATE (2.5 MG/3ML) 0.083% IN NEBU: 2.5000 mg | INHALATION_SOLUTION | RESPIRATORY_TRACT | Status: DC | PRN

## 2016-04-16 MED ORDER — SODIUM CHLORIDE 0.9 % IV BOLUS (SEPSIS)
1000.0000 mL | Freq: Once | INTRAVENOUS | Status: AC
  Administered 2016-04-16: 1000 mL via INTRAVENOUS

## 2016-04-16 MED ORDER — VANCOMYCIN HCL IN DEXTROSE 1-5 GM/200ML-% IV SOLN
1000.0000 mg | Freq: Two times a day (BID) | INTRAVENOUS | Status: DC
  Administered 2016-04-17 – 2016-04-18 (×3): 1000 mg via INTRAVENOUS
  Filled 2016-04-16 (×3): qty 200

## 2016-04-16 MED ORDER — ACETAMINOPHEN 650 MG RE SUPP: 650.0000 mg | Freq: Four times a day (QID) | RECTAL | Status: DC | PRN

## 2016-04-16 MED ORDER — MAGNESIUM HYDROXIDE 400 MG/5ML PO SUSP: 30.0000 mL | Freq: Every evening | ORAL | Status: DC | PRN

## 2016-04-16 MED ORDER — ATORVASTATIN CALCIUM 40 MG PO TABS
80.0000 mg | ORAL_TABLET | Freq: Every day | ORAL | Status: DC
  Administered 2016-04-16 – 2016-04-21 (×6): 80 mg via ORAL
  Filled 2016-04-16 (×6): qty 2

## 2016-04-16 MED ORDER — IOPAMIDOL (ISOVUE-370) INJECTION 76%
INTRAVENOUS | Status: AC
  Filled 2016-04-16: qty 100

## 2016-04-16 MED ORDER — HYDROXYZINE HCL 25 MG PO TABS: 25.0000 mg | ORAL_TABLET | Freq: Four times a day (QID) | ORAL | Status: DC | PRN

## 2016-04-16 MED ORDER — LEVETIRACETAM 750 MG PO TABS
750.0000 mg | ORAL_TABLET | Freq: Two times a day (BID) | ORAL | Status: DC
  Administered 2016-04-16 – 2016-04-18 (×5): 750 mg via ORAL
  Filled 2016-04-16 (×6): qty 1

## 2016-04-16 MED ORDER — AMLODIPINE BESYLATE 10 MG PO TABS
10.0000 mg | ORAL_TABLET | Freq: Every day | ORAL | Status: DC
  Administered 2016-04-16: 10 mg via ORAL
  Filled 2016-04-16: qty 1

## 2016-04-16 MED ORDER — ONDANSETRON HCL 4 MG PO TABS: 4.0000 mg | ORAL_TABLET | Freq: Four times a day (QID) | ORAL | Status: DC | PRN

## 2016-04-16 MED ORDER — SODIUM CHLORIDE 0.9 % IV BOLUS (SEPSIS): 1000.0000 mL | Freq: Once | INTRAVENOUS | Status: DC

## 2016-04-16 MED ORDER — VANCOMYCIN HCL IN DEXTROSE 1-5 GM/200ML-% IV SOLN
1000.0000 mg | Freq: Once | INTRAVENOUS | Status: AC
  Administered 2016-04-16: 1000 mg via INTRAVENOUS
  Filled 2016-04-16: qty 200

## 2016-04-16 MED ORDER — ONDANSETRON HCL 4 MG/2ML IJ SOLN: 4.0000 mg | Freq: Four times a day (QID) | INTRAMUSCULAR | Status: DC | PRN

## 2016-04-16 MED ORDER — DEXTROSE 5 % IV SOLN
1.0000 g | Freq: Three times a day (TID) | INTRAVENOUS | Status: DC
  Administered 2016-04-16 – 2016-04-20 (×11): 1 g via INTRAVENOUS
  Filled 2016-04-16 (×12): qty 1

## 2016-04-16 MED ORDER — NYSTATIN 100000 UNIT/ML MT SUSP
5.0000 mL | Freq: Four times a day (QID) | OROMUCOSAL | Status: DC
  Administered 2016-04-16 – 2016-04-20 (×15): 500000 [IU] via ORAL
  Filled 2016-04-16 (×14): qty 5

## 2016-04-16 MED ORDER — KETOROLAC TROMETHAMINE 30 MG/ML IJ SOLN
30.0000 mg | Freq: Four times a day (QID) | INTRAMUSCULAR | Status: AC | PRN
  Administered 2016-04-17 – 2016-04-21 (×6): 30 mg via INTRAVENOUS
  Filled 2016-04-16 (×6): qty 1

## 2016-04-16 MED ORDER — OXYCODONE HCL 5 MG PO TABS
5.0000 mg | ORAL_TABLET | ORAL | Status: DC | PRN
  Administered 2016-04-16 – 2016-04-22 (×13): 5 mg via ORAL
  Filled 2016-04-16 (×13): qty 1

## 2016-04-16 MED ORDER — ENOXAPARIN SODIUM 40 MG/0.4ML ~~LOC~~ SOLN
40.0000 mg | SUBCUTANEOUS | Status: DC
  Administered 2016-04-16 – 2016-04-21 (×6): 40 mg via SUBCUTANEOUS
  Filled 2016-04-16 (×6): qty 0.4

## 2016-04-16 MED ORDER — DEXTROSE 5 % IV SOLN
1.0000 g | Freq: Three times a day (TID) | INTRAVENOUS | Status: DC
  Filled 2016-04-16: qty 1

## 2016-04-16 MED ORDER — ALUM & MAG HYDROXIDE-SIMETH 200-200-20 MG/5ML PO SUSP: 30.0000 mL | ORAL | Status: DC | PRN

## 2016-04-16 MED ORDER — DEXTROSE 5 % IV SOLN
1.0000 g | Freq: Once | INTRAVENOUS | Status: DC
  Filled 2016-04-16: qty 1

## 2016-04-16 MED ORDER — DICYCLOMINE HCL 10 MG/ML IM SOLN
20.0000 mg | Freq: Once | INTRAMUSCULAR | Status: AC
  Administered 2016-04-16: 20 mg via INTRAMUSCULAR
  Filled 2016-04-16: qty 2

## 2016-04-16 MED ORDER — ASPIRIN EC 325 MG PO TBEC
325.0000 mg | DELAYED_RELEASE_TABLET | Freq: Every day | ORAL | Status: DC
Start: 2016-04-17 — End: 2016-04-22
  Administered 2016-04-17 – 2016-04-22 (×6): 325 mg via ORAL
  Filled 2016-04-16 (×6): qty 1

## 2016-04-16 MED ORDER — LISINOPRIL 20 MG PO TABS: 20.0000 mg | ORAL_TABLET | Freq: Every day | ORAL | Status: DC

## 2016-04-16 MED ORDER — POLYVINYL ALCOHOL 1.4 % OP SOLN: 1.0000 [drp] | OPHTHALMIC | Status: DC | PRN

## 2016-04-16 MED ORDER — BACLOFEN 20 MG PO TABS
20.0000 mg | ORAL_TABLET | Freq: Three times a day (TID) | ORAL | Status: DC
Start: 2016-04-16 — End: 2016-04-22
  Administered 2016-04-16 – 2016-04-22 (×17): 20 mg via ORAL
  Filled 2016-04-16 (×17): qty 1

## 2016-04-16 MED ORDER — LABETALOL HCL 200 MG PO TABS
200.0000 mg | ORAL_TABLET | Freq: Two times a day (BID) | ORAL | Status: DC
  Administered 2016-04-16: 200 mg via ORAL
  Filled 2016-04-16 (×2): qty 1

## 2016-04-16 MED ORDER — ACETAMINOPHEN 325 MG PO TABS
650.0000 mg | ORAL_TABLET | Freq: Four times a day (QID) | ORAL | Status: DC | PRN
  Administered 2016-04-17 – 2016-04-22 (×3): 650 mg via ORAL
  Filled 2016-04-16 (×3): qty 2

## 2016-04-16 MED ORDER — POTASSIUM CHLORIDE IN NACL 20-0.9 MEQ/L-% IV SOLN
INTRAVENOUS | Status: DC
  Administered 2016-04-16 – 2016-04-18 (×3): via INTRAVENOUS
  Filled 2016-04-16 (×3): qty 1000

## 2016-04-16 MED ORDER — TIOTROPIUM BROMIDE MONOHYDRATE 18 MCG IN CAPS
18.0000 ug | ORAL_CAPSULE | Freq: Every day | RESPIRATORY_TRACT | Status: DC
  Filled 2016-04-16: qty 5

## 2016-04-16 MED ORDER — VANCOMYCIN HCL IN DEXTROSE 1-5 GM/200ML-% IV SOLN: 1000.0000 mg | Freq: Once | INTRAVENOUS | Status: DC

## 2016-04-16 MED ORDER — LORATADINE 10 MG PO TABS
10.0000 mg | ORAL_TABLET | Freq: Every day | ORAL | Status: DC
  Administered 2016-04-17 – 2016-04-22 (×6): 10 mg via ORAL
  Filled 2016-04-16 (×6): qty 1

## 2016-04-16 NOTE — ED Notes (Signed)
Attempted to call report.  RN asked to call back in 10 min.

## 2016-04-16 NOTE — ED Notes (Signed)
Pt attempted to use urinal and was not able to urinate.

## 2016-04-16 NOTE — ED Notes (Signed)
Unsuccessful I&O catheter attempted by Miachel RouxJonna NT with this RN at bedside. PA made aware.

## 2016-04-16 NOTE — ED Notes (Signed)
Assisted pt to attempt urine sample with urinal. Pt unable to go at this time.

## 2016-04-16 NOTE — H&P (Signed)
History and Physical    Daniel JacobKirk Torrisi RUE:[id number] DOB: [birth year] DOA: 04/16/2016  PCP: [facility] Consultants:  Cardiology - [facility] - [facility]? [facility]: [facility]; Ex-wife, [phone]  Chief Complaint: hypoxia and paroxysmal abdominal pain  HPI: Daniel Oconnell is a 60 y.o. 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  History was obtained from his daughter as well as from the patient in a kind of charades game - he is able to say yes and no (reasonably accurately) and can make a few other intelligible sounds and also uses gestures and facial expressions.  He reports feeling nauseated Saturday night, explosive diarrhea for several days.  Fell Saturday night.  Developed RLQ abdominal pain.  Worsened to the point Tuesday that he felt the need to call 911 and come to ER.  Urine culture negative, CT unremarkable.  O2 levels 83-84% before discharge, but since he has COPD they discharged him.  Last night, pain worse again. Got meds overnight but still hurting this AM.  He once again was sent back to the ER. Pain worse with moving, elevation of bed.  No emesis with prior stomach bug, uncertain if he has a gag reflex.  No cough.  Possibly had fever on Saturday, Sunday.      ED Course: Per PA-C West: Afebrile, nontoxic patient with c/o right lower abdominal pain, which may be from strained muscle or recent GI illness - labs and UA reassuring. CTabd/pelvis performed two days ago also reassuring. Pt found to be hypoxic in the low 80s while in the ED. Given patient's expressive aphasia it is unclear if there may be some symptoms. He does appear to have increased work of breathing and family notes he gets SOB with walking around. CT angio chest demonstrates bilateral lower lobe collapse vs consolidation with small  effusions. Pt to be treated for HCAP with vanc and cefepime. Nebs given. Admitted to [facility], Dr Ophelia CharterYates accepting.    Review of Systems: As per HPI; otherwise 10 point review of systems reviewed and negative; note: this may be inaccurate based on the patient's limited ability to communicate  Ambulatory Status: ambulates without assistance  Past Medical History:  Diagnosis Date  . 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 CAD (coronary artery disease) 2001   CABG  . COPD (chronic obstructive pulmonary disease) (HCC)   . CVA (cerebral vascular accident) (HCC) 2001, 2013   x2  . Expressive aphasia   . 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 Hypertension     Past Surgical History:  Procedure Laterality Date  . APPENDECTOMY    . CORONARY ARTERY BYPASS GRAFT  2001  . LUMBAR SPINE SURGERY      Social History   Social History  . Marital status: Divorced    Spouse name: N/A  . Number of children: N/A  . Years of education: N/A   Occupational History  . disabled    Social History Main Topics  . Smoking status: Former Smoker    Quit date: 2013  . Smokeless tobacco: Never Used  . Alcohol use No     Comment: h/o heavy ETOH use  . Drug use: No     Comment: h/o drug use  . Sexual activity: Not on file   Other Topics Concern  . Not on file  Social History Narrative  . No narrative on file    No Known Allergies  Family History  Problem Relation Age of Onset  . CVA Mother [id number]    Prior to Admission medications   Medication Sig Start Date End Date Taking? Authorizing Provider  acetaminophen (TYLENOL) 500 MG tablet Take 500 mg by mouth every 4 (four) hours as needed for mild pain, moderate pain, fever or headache.    Yes Historical Provider, MD  alum & mag hydroxide-simeth (MINTOX) 200-200-20 MG/5ML suspension Take 30 mLs by mouth as needed for indigestion or heartburn.   Yes Historical Provider, MD  amLODipine (NORVASC) 10  MG tablet Take 10 mg by mouth at bedtime.    Yes Historical Provider, MD  aspirin 325 MG EC tablet Take 325 mg by mouth daily with breakfast.    Yes Historical Provider, MD  atorvastatin (LIPITOR) 80 MG tablet Take 80 mg by mouth at bedtime.    Yes Historical Provider, MD  baclofen (LIORESAL) 20 MG tablet Take 20 mg by mouth 3 (three) times daily.   Yes Historical Provider, MD  cetirizine (ZYRTEC) 10 MG tablet Take 10 mg by mouth daily with breakfast.    Yes Historical Provider, MD  Cholecalciferol (VITAMIN D) 2000 units tablet Take 2,000 Units by mouth daily with breakfast.   Yes Historical Provider, MD  clopidogrel (PLAVIX) 75 MG tablet Take 75 mg by mouth daily with breakfast.    Yes Historical Provider, MD  dicyclomine (BENTYL) 20 MG tablet Take 1 tablet (20 mg total) by mouth 2 (two) times daily. Take for abdominal pain/cramping 04/15/16  Yes Antony MaduraKelly Humes, PA-C  guaifenesin (ROBAFEN) 100 MG/5ML syrup Take 200 mg by mouth every 6 (six) hours as needed for cough.   Yes Historical Provider, MD  hydrALAZINE (APRESOLINE) 100 MG tablet Take 100 mg by mouth 2 (two) times daily.   Yes Historical Provider, MD  hydrOXYzine (ATARAX/VISTARIL) 25 MG tablet Take 25 mg by mouth every 6 (six) hours as needed for itching.    Yes Historical Provider, MD  labetalol (NORMODYNE) 200 MG tablet Take 200 mg by mouth 2 (two) times daily.   Yes Historical Provider, MD  lactobacillus acidophilus & bulgar (LACTINEX) chewable tablet Chew 1 tablet by mouth daily with breakfast.    Yes Historical Provider, MD  levETIRAcetam (KEPPRA) 750 MG tablet Take 750 mg by mouth 2 (two) times daily.   Yes Historical Provider, MD  loperamide (IMODIUM) 2 MG capsule Take by mouth as needed for diarrhea or loose stools.   Yes Historical Provider, MD  losartan (COZAAR) 50 MG tablet Take 50 mg by mouth daily with breakfast.   Yes Historical Provider, MD  magnesium hydroxide (MILK OF MAGNESIA) 400 MG/5ML suspension Take 30 mLs by mouth at  bedtime as needed for mild constipation.   Yes Historical Provider, MD  neomycin-bacitracin-polymyxin (NEOSPORIN) ointment Apply 1 application topically as needed for wound care.   Yes Historical Provider, MD  ondansetron (ZOFRAN ODT) 4 MG disintegrating tablet Take 1 tablet (4 mg total) by mouth every 8 (eight) hours as needed for nausea or vomiting. 04/15/16  Yes Antony MaduraKelly Humes, PA-C  polyethylene glycol powder (GLYCOLAX/MIRALAX) powder Take 17 g by mouth 2 (two) times daily. Use for constipation, as prescribed, until daily soft stools  OTC 04/15/16  Yes Antony MaduraKelly Humes, PA-C  sertraline (ZOLOFT) 100 MG tablet Take 100 mg by mouth daily with breakfast.    Yes Historical Provider, MD  tiotropium (SPIRIVA) 18 MCG inhalation capsule Place 18 mcg  into inhaler and inhale daily.   Yes Historical Provider, MD  vitamin B-12 (CYANOCOBALAMIN) 1000 MCG tablet Take 1,000 mcg by mouth daily with breakfast.    Yes Historical Provider, MD  zolpidem (AMBIEN CR) 12.5 MG CR tablet Take 12.5 mg by mouth at bedtime.    Yes Historical Provider, MD  ketoconazole (NIZORAL) 2 % cream Apply 1 application topically daily.    Historical Provider, MD  lisinopril (PRINIVIL,ZESTRIL) 20 MG tablet Take 20 mg by mouth daily.    Historical Provider, MD  polyvinyl alcohol (LIQUIFILM TEARS) 1.4 % ophthalmic solution 1 drop as needed for dry eyes.    Historical Provider, MD  triamcinolone cream (KENALOG) 0.1 % Apply 1 application topically 2 (two) times daily.    Historical Provider, MD    Physical Exam: Vitals:   04/16/16 1837 04/16/16 1922 04/16/16 2000 04/16/16 2125  BP: 148/61  139/69 (!) 141/79  Pulse: 68  68 66  Resp: 20  19 18   Temp:    99 F (37.2 C)  TempSrc:    Oral  SpO2: 94% 95% 91% 92%  Weight:    89.5 kg (197 lb 5 oz)  Height:    5\' 10"  (1.778 m)     General: Very pleasant and jovial, tries to communicate; 1 episode of abdominal pain during evaluation, flexed legs and cried out for <1 minute and then it  resolved Eyes:  PERRL, EOMI, normal lids, iris ENT:  grossly normal hearing, lips & tongue, mmm Neck:  no LAD, masses or thyromegaly Cardiovascular:  RRR, no m/r/g. No LE edema.  Respiratory:  CTA bilaterally, no w/r/r. Normal respiratory effort. Abdomen:  soft, ntnd, NABS Skin:  no rash or induration seen on limited exam Musculoskeletal:  grossly normal tone BUE/BLE, good ROM, no bony abnormality Psychiatric:  grossly normal mood and affect, speech sparse and difficult to understand Neurologic:  CN 2-12 grossly intact, moves all extremities in coordinated fashion, sensation intact  Labs on Admission: I have personally reviewed following labs and imaging studies  CBC:  Recent Labs Lab 04/14/16 2218 04/16/16 1317 04/17/16 0151  WBC 14.6* 11.5* 10.8*  NEUTROABS 11.6* 9.3*  --   HGB 14.5 13.4 12.3*  HCT 41.4 39.2 36.1*  MCV 83.3 84.8 84.0  PLT 199 197 191   Basic Metabolic Panel:  Recent Labs Lab 04/14/16 2218 04/16/16 1317 04/17/16 0151  NA 138 139 138  K 3.4* 3.5 3.1*  CL 106 104 105  CO2 23 29 27   GLUCOSE 103* 110* 98  BUN 17 14 12   CREATININE 1.08 0.98 0.88  CALCIUM 8.5* 8.3* 7.8*   GFR: Estimated Creatinine Clearance: 98 mL/min (by C-G formula based on SCr of 0.88 mg/dL). Liver Function Tests:  Recent Labs Lab 04/14/16 2218 04/16/16 1317  AST 18 18  ALT 18 17  ALKPHOS 69 60  BILITOT 1.2 1.2  PROT 6.7 6.3*  ALBUMIN 3.8 3.5    Recent Labs Lab 04/14/16 2218 04/16/16 1317  LIPASE 18 22   No results for input(s): AMMONIA in the last 168 hours. Coagulation Profile:  Recent Labs Lab 04/16/16 2208  INR 1.20   Cardiac Enzymes: No results for input(s): CKTOTAL, CKMB, CKMBINDEX, TROPONINI in the last 168 hours. BNP (last 3 results) No results for input(s): PROBNP in the last 8760 hours. HbA1C: No results for input(s): HGBA1C in the last 72 hours. CBG: No results for input(s): GLUCAP in the last 168 hours. Lipid Profile: No results for  input(s): CHOL, HDL, LDLCALC, TRIG, CHOLHDL,  LDLDIRECT in the last 72 hours. Thyroid Function Tests: No results for input(s): TSH, T4TOTAL, FREET4, T3FREE, THYROIDAB in the last 72 hours. Anemia Panel: No results for input(s): VITAMINB12, FOLATE, FERRITIN, TIBC, IRON, RETICCTPCT in the last 72 hours. Urine analysis:    Component Value Date/Time   COLORURINE AMBER (A) 04/16/2016 1830   APPEARANCEUR CLEAR 04/16/2016 1830   LABSPEC >1.046 (H) 04/16/2016 1830   PHURINE 6.0 04/16/2016 1830   GLUCOSEU NEGATIVE 04/16/2016 1830   HGBUR NEGATIVE 04/16/2016 1830   BILIRUBINUR SMALL (A) 04/16/2016 1830   KETONESUR NEGATIVE 04/16/2016 1830   PROTEINUR 30 (A) 04/16/2016 1830   NITRITE NEGATIVE 04/16/2016 1830   LEUKOCYTESUR NEGATIVE 04/16/2016 1830    Creatinine Clearance: Estimated Creatinine Clearance: 98 mL/min (by C-G formula based on SCr of 0.88 mg/dL).  Sepsis Labs: @LABRCNTIP (procalcitonin:4,lacticidven:4) ) Recent Results (from the past 240 hour(s))  Urine culture     Status: Abnormal   Collection Time: 04/15/16  2:13 AM  Result Value Ref Range Status   Specimen Description URINE, CLEAN CATCH  Final   Special Requests NONE  Final   Culture (A)  Final    <10,000 COLONIES/mL INSIGNIFICANT GROWTH Performed at [facility]    Report Status 04/16/2016 FINAL  Final  MRSA PCR Screening     Status: None   Collection Time: 04/16/16 10:11 PM  Result Value Ref Range Status   MRSA by PCR NEGATIVE NEGATIVE Final    Comment:        The GeneXpert MRSA Assay (FDA approved for NASAL specimens only), is one component of a comprehensive MRSA colonization surveillance program. It is not intended to diagnose MRSA infection nor to guide or monitor treatment for MRSA infections.      Radiological Exams on Admission: Dg Chest 2 View  Result Date: 04/16/2016 CLINICAL DATA:  Hypoxia EXAM: CHEST  2 VIEW COMPARISON:  None. FINDINGS: Borderline cardiomegaly. Status post CABG. There is  mild interstitial prominence bilaterally without convincing pulmonary edema. Osteopenia and mild degenerative changes thoracic spine. No segmental infiltrate. IMPRESSION: Mild interstitial prominence bilateral without convincing pulmonary edema. No segmental infiltrate. Electronically Signed   By: Natasha Mead M.D.   On: 04/16/2016 14:37   Ct Angio Chest Pe W And/or Wo Contrast  Result Date: 04/16/2016 CLINICAL DATA:  Right upper quadrant pain and hypoxia. EXAM: CT ANGIOGRAPHY CHEST WITH CONTRAST TECHNIQUE: Multidetector CT imaging of the chest was performed using the standard protocol during bolus administration of intravenous contrast. Multiplanar CT image reconstructions and MIPs were obtained to evaluate the vascular anatomy. CONTRAST:  100 cc Isovue 370 COMPARISON:  None. FINDINGS: Cardiovascular: Heart is enlarged. Coronary artery calcification is noted. Patient is status post CABG Atherosclerotic calcification is noted in the wall of the thoracic aorta. No thoracic aortic dissection. 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 Pulmonary arterial opacification is suboptimal secondary to the central venous stenosis, but within this limitation there is no evidence for large central pulmonary embolus in the main pulmonary outflow tract, main pulmonary arteries, or lobar pulmonary arteries. There is no definite pulmonary embolus in segmental and subsegmental pulmonary arteries although assessment may not be reliable given bolus timing. Mediastinum/Nodes: No mediastinal lymphadenopathy. There is no hilar lymphadenopathy. Borderline lymphadenopathy is identified in the right hilum. The esophagus has normal imaging features. There is no axillary lymphadenopathy. Lungs/Pleura: Emphysema noted bilaterally with interlobular septal thickening and bronchial wall thickening. Dependent  collapse/consolidation is identified in both lower lobes and small bilateral pleural effusions are evident. Upper Abdomen: Unremarkable. Musculoskeletal: Bone windows reveal no worrisome lytic or sclerotic osseous lesions. 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 Review of the MIP images confirms the above findings. IMPRESSION: 1. Suboptimal bolus timing due to central venous stenosis. Within this limitation, there is no evidence for large central pulmonary embolus. While no embolic disease is seen in segmental or subsegmental pulmonary arteries, evaluation may not be reliable given the bolus timing. 2. Coronary artery and thoracic aortic atherosclerosis. 3. Dependent collapse/consolidation of both lower lobes with small bilateral pleural effusions. Electronically Signed   By: Kennith Center M.D.   On: 04/16/2016 18:11    EKG: Independently reviewed.  NSR with rate 60; T wave inversions which may be concerning for ischemia  Assessment/Plan Principal Problem:   Acute respiratory failure with hypoxia (HCC) Active Problems:   HCAP (healthcare-associated pneumonia)   Abdominal pain, acute, right lower quadrant   Nonspecific abnormal electrocardiogram (ECG) (EKG)   H/O: CVA (cerebrovascular accident)   COPD (chronic obstructive pulmonary disease) (HCC)   CAD (coronary artery disease)   Essential hypertension   Acute respiratory failure with hypoxia -Patient with incidental finding of hypoxia both during prior ER visit and tonight -ABG shows metabolic alkalosis with concomitant respiratory acidosis -Patient with h/o COPD, but pCO2 is normal and so this seems unlikely to be the etiology -No cough, but patient may not have (significant) cough reflex given his h/o CVA and apraxia/aphasia -CXR negative for infiltrate but patient received 2 L IVF after arrival and CXR may have been altered due to dehydration -Chest CTA does appear to show atelectasis vs. Infiltrate -Given marked hypoxia and  placement in nursing facility, will treat as HCAP -With hypoxia and elevated WBC count, concern was raised for sepsis (particularly with borderline BP on presentation); lactate negative and patient generally appears well so no serious concern at this time -PNA order set utilized  Abdominal pain -Uncertain etiology -Had to relate to hypoxia -CT prior showed possible cystitis but culture was negative -CT also showed possible adrenal nodularity -The patient does not seem to show any current signs of adrenal insufficiency -Consider additional imaging of adrenals either as inpatient or outpatient  Abnormal EKG, h/o CAD s/p CABG -EKG is abnormal, but patient does not c/o apparent chest pain -Initial troponin was negative -Will trend troponins to decide if additional inpatient evaluation is warranted -Continue beta blocker, Plavix and ASA  HTN -Patient appears to have been taking both Lisinopril and Losartan; Losartan is held and likely needs discontinuation -Will continue CCB, BB, ACE, and hydralazine     DVT prophylaxis: Lovenox Code Status: DNR - confirmed with patient/family Family Communication: Daughter present throughout evaluation Disposition Plan: Back to [facility] once clinically improved; SW consult requested to facilitate Consults called: SW Admission status: Admit - It is my clinical opinion that admission to INPATIENT is reasonable and necessary because this patient will require at least 2 midnights in the hospital to treat this condition based on the medical complexity of the problems presented.  Given the aforementioned information, the predictability of an adverse outcome is felt to be significant.    Jonah Blue MD [facility]  If 7PM-7AM, please contact night-coverage [email or web address] Password [email or web address]  04/17/2016, 2:57 AM

## 2016-04-16 NOTE — ED Notes (Signed)
Bladder Scan: 0ML ?

## 2016-04-16 NOTE — ED Notes (Signed)
Urinal at bedside.  

## 2016-04-16 NOTE — Progress Notes (Addendum)
Pharmacy Antibiotic Note  Daniel Oconnell is a 60 y.o. male admitted on 04/16/2016 with abdominal pain.  Pharmacy has been consulted for cefepime dosing for HCAP as CT shows dependent collapse/consolidation of both lower lobes with small bilateral pleural effusions..  Plan:  Cefepime 1g IV q8h  Follow up renal function & cultures    Temp (24hrs), Avg:98 F (36.7 C), Min:98 F (36.7 C), Max:98 F (36.7 C)   Recent Labs Lab 04/14/16 2218 04/16/16 1317  WBC 14.6* 11.5*  CREATININE 1.08 0.98    CrCl cannot be calculated (Unknown ideal weight.).    No Known Allergies  Antimicrobials this admission:  11/30 Cefepime >> 11/30 Vanc x 1  Dose adjustments this admission:  ---  Microbiology results:  11/29 UCx: <10k insignificant 11/30 UCx: sent  Thank you for allowing pharmacy to be a part of this patient's care.  Loralee PacasErin Kemyah Buser, PharmD, BCPS Pager: [phone] 04/16/2016 7:44 PM   Addendum: Now consulted to continue dosing vancomycin on admission.  Will continue with 1g IV q12h and plan to check trough at steady state.  Loralee PacasErin Michel Hendon, PharmD, BCPS 04/16/2016 9:36 PM

## 2016-04-16 NOTE — ED Notes (Addendum)
Pt called this nurse back into the room and wanted to try to use the urinal again.

## 2016-04-16 NOTE — ED Triage Notes (Signed)
Pt returns to [facility] via [facility] after being seen here 2 days ago for same. Pt continues to complain of RUQ pain and constipation. [facility].

## 2016-04-16 NOTE — ED Notes (Addendum)
Bladder scan performed by NT showed 0 mL. Will reassess for urine sample after fluid bag completes. PA is aware.

## 2016-04-16 NOTE — ED Provider Notes (Signed)
[facility]-EMERGENCY DEPT Provider Note   CSN: [id number] Arrival date & time: 04/16/16  1150     History   Chief Complaint Chief Complaint  Patient presents with  . Abdominal Pain    HPI Daniel Oconnell is a 60 y.o. male.  HPI   Pt unable to communicate much at baseline, ex-wife assists in history.    Patient with hx CVA and speech limitation p/w intermittent right lower quadrant abdominal pain. The facility where he lives has had a bout of viral GI illness and pt has also had this.  Has had diarrhea, decreased appetite.  The pain is intermittent and crampy, lasts 10 seconds at a time and releases.  Denies urinary symptoms. Pt was seen in ED two days ago and had CT abd/pelvis and labs for similar symptoms.  CT suggested possible bladder wall thickening.  UA did not appear infected and culture grew <10,000 colonies.    Of note, pt was hypoxic upon arrival.  Currently on O2.  Pt denies any chest pain, SOB, wheezing, or cough.  Pt has hx COPD.  Ex-wife notes pt does get SOB with significant exertion.    Level V caveat for expressive aphasia from prior CVA    Past Medical History:  Diagnosis Date  . 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 CAD (coronary artery disease) 2001   CABG  . COPD (chronic obstructive pulmonary disease) (HCC)   . CVA (cerebral vascular accident) (HCC) 2001, 2013   x2  . Expressive aphasia   . 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 Hypertension     Patient Active Problem List   Diagnosis Date Noted  . HCAP (healthcare-associated pneumonia) 04/16/2016    Past Surgical History:  Procedure Laterality Date  . APPENDECTOMY    . CORONARY ARTERY BYPASS GRAFT  2001  . LUMBAR SPINE SURGERY         Home Medications    Prior to Admission medications   Medication Sig Start Date End Date Taking? Authorizing Provider  acetaminophen (TYLENOL) 500 MG tablet Take 500 mg by mouth every 4 (four) hours as needed for mild  pain, moderate pain, fever or headache.    Yes Historical Provider, MD  alum & mag hydroxide-simeth (MINTOX) 200-200-20 MG/5ML suspension Take 30 mLs by mouth as needed for indigestion or heartburn.   Yes Historical Provider, MD  amLODipine (NORVASC) 10 MG tablet Take 10 mg by mouth at bedtime.    Yes Historical Provider, MD  aspirin 325 MG EC tablet Take 325 mg by mouth daily with breakfast.    Yes Historical Provider, MD  atorvastatin (LIPITOR) 80 MG tablet Take 80 mg by mouth at bedtime.    Yes Historical Provider, MD  baclofen (LIORESAL) 20 MG tablet Take 20 mg by mouth 3 (three) times daily.   Yes Historical Provider, MD  cetirizine (ZYRTEC) 10 MG tablet Take 10 mg by mouth daily with breakfast.    Yes Historical Provider, MD  Cholecalciferol (VITAMIN D) 2000 units tablet Take 2,000 Units by mouth daily with breakfast.   Yes Historical Provider, MD  clopidogrel (PLAVIX) 75 MG tablet Take 75 mg by mouth daily with breakfast.    Yes Historical Provider, MD  dicyclomine (BENTYL) 20 MG tablet Take 1 tablet (20 mg total) by mouth 2 (two) times daily. Take for abdominal pain/cramping 04/15/16  Yes Antony Madura, PA-C  guaifenesin (ROBAFEN) 100 MG/5ML syrup Take 200 mg by mouth every 6 (six) hours as needed  for cough.   Yes Historical Provider, MD  hydrALAZINE (APRESOLINE) 100 MG tablet Take 100 mg by mouth 2 (two) times daily.   Yes Historical Provider, MD  hydrOXYzine (ATARAX/VISTARIL) 25 MG tablet Take 25 mg by mouth every 6 (six) hours as needed for itching.    Yes Historical Provider, MD  labetalol (NORMODYNE) 200 MG tablet Take 200 mg by mouth 2 (two) times daily.   Yes Historical Provider, MD  lactobacillus acidophilus & bulgar (LACTINEX) chewable tablet Chew 1 tablet by mouth daily with breakfast.    Yes Historical Provider, MD  levETIRAcetam (KEPPRA) 750 MG tablet Take 750 mg by mouth 2 (two) times daily.   Yes Historical Provider, MD  loperamide (IMODIUM) 2 MG capsule Take by mouth as needed  for diarrhea or loose stools.   Yes Historical Provider, MD  losartan (COZAAR) 50 MG tablet Take 50 mg by mouth daily with breakfast.   Yes Historical Provider, MD  magnesium hydroxide (MILK OF MAGNESIA) 400 MG/5ML suspension Take 30 mLs by mouth at bedtime as needed for mild constipation.   Yes Historical Provider, MD  neomycin-bacitracin-polymyxin (NEOSPORIN) ointment Apply 1 application topically as needed for wound care.   Yes Historical Provider, MD  ondansetron (ZOFRAN ODT) 4 MG disintegrating tablet Take 1 tablet (4 mg total) by mouth every 8 (eight) hours as needed for nausea or vomiting. 04/15/16  Yes Antony MaduraKelly Humes, PA-C  polyethylene glycol powder (GLYCOLAX/MIRALAX) powder Take 17 g by mouth 2 (two) times daily. Use for constipation, as prescribed, until daily soft stools  OTC 04/15/16  Yes Antony MaduraKelly Humes, PA-C  sertraline (ZOLOFT) 100 MG tablet Take 100 mg by mouth daily with breakfast.    Yes Historical Provider, MD  tiotropium (SPIRIVA) 18 MCG inhalation capsule Place 18 mcg into inhaler and inhale daily.   Yes Historical Provider, MD  vitamin B-12 (CYANOCOBALAMIN) 1000 MCG tablet Take 1,000 mcg by mouth daily with breakfast.    Yes Historical Provider, MD  zolpidem (AMBIEN CR) 12.5 MG CR tablet Take 12.5 mg by mouth at bedtime.    Yes Historical Provider, MD    Family History Family History  Problem Relation Age of Onset  . CVA Mother 480    Social History Social History  Substance Use Topics  . Smoking status: Former Smoker    Quit date: 2013  . Smokeless tobacco: Never Used  . Alcohol use No     Comment: h/o heavy ETOH use     Allergies   Patient has no known allergies.   Review of Systems Review of Systems  Unable to perform ROS: Patient nonverbal     Physical Exam Updated Vital Signs BP (!) 141/79 (BP Location: Right Arm)   Pulse 66   Temp 99 F (37.2 C) (Oral)   Resp 18   SpO2 92%   Physical Exam  Constitutional: He appears well-developed and  well-nourished. No distress.  HENT:  Head: Normocephalic and atraumatic.  Neck: Neck supple.  Cardiovascular: Normal rate and regular rhythm.   Pulmonary/Chest: Effort normal and breath sounds normal. No respiratory distress. He has no wheezes. He has no rales.  Abdominal: Soft. He exhibits no distension and no mass. There is no tenderness. There is no rebound and no guarding.  Neurological: He is alert. He exhibits normal muscle tone.  Skin: He is not diaphoretic.  Nursing note and vitals reviewed.    ED Treatments / Results  Labs (all labs ordered are listed, but only abnormal results are displayed) Labs Reviewed  COMPREHENSIVE METABOLIC PANEL - Abnormal; Notable for the following:       Result Value   Glucose, Bld 110 (*)    Calcium 8.3 (*)    Total Protein 6.3 (*)    All other components within normal limits  CBC WITH DIFFERENTIAL/PLATELET - Abnormal; Notable for the following:    WBC 11.5 (*)    Neutro Abs 9.3 (*)    Monocytes Absolute 1.2 (*)    All other components within normal limits  URINALYSIS, ROUTINE W REFLEX MICROSCOPIC (NOT AT [facility]) - Abnormal; Notable for the following:    Color, Urine AMBER (*)    Specific Gravity, Urine >1.046 (*)    Bilirubin Urine SMALL (*)    Protein, ur 30 (*)    All other components within normal limits  BRAIN NATRIURETIC PEPTIDE - Abnormal; Notable for the following:    B Natriuretic Peptide 441.4 (*)    All other components within normal limits  URINE CULTURE  CULTURE, BLOOD (ROUTINE X 2)  CULTURE, BLOOD (ROUTINE X 2)  LIPASE, BLOOD  URINE MICROSCOPIC-ADD ON  BASIC METABOLIC PANEL  LACTIC ACID, PLASMA  LACTIC ACID, PLASMA  PROCALCITONIN  PROTIME-INR  BLOOD GAS, ARTERIAL  CBC  APTT  I-STAT TROPOININ, ED  I-STAT CG4 LACTIC ACID, ED    EKG  EKG Interpretation  Date/Time:  Thursday April 16 2016 13:17:12 EST Ventricular Rate:  60 PR Interval:    QRS Duration: 100 QT Interval:  452 QTC Calculation: 452 R  Axis:   20 Text Interpretation:  Sinus rhythm Left atrial enlargement Abnrm T, consider ischemia, anterolateral lds T wave inversions in precordial and lateral leads concerning for ischemia, no previous EKG available Confirmed by LITTLE MD, RACHEL ([id number]) on 04/16/2016 1:19:41 PM Also confirmed by LITTLE MD, RACHEL [phone]), editor [place], [place] [phone])  on 04/16/2016 2:46:37 PM       Radiology Dg Chest 2 View  Result Date: 04/16/2016 CLINICAL DATA:  Hypoxia EXAM: CHEST  2 VIEW COMPARISON:  None. FINDINGS: Borderline cardiomegaly. Status post CABG. There is mild interstitial prominence bilaterally without convincing pulmonary edema. Osteopenia and mild degenerative changes thoracic spine. No segmental infiltrate. IMPRESSION: Mild interstitial prominence bilateral without convincing pulmonary edema. No segmental infiltrate. Electronically Signed   By: Natasha Mead M.D.   On: 04/16/2016 14:37   Ct Angio Chest Pe W And/or Wo Contrast  Result Date: 04/16/2016 CLINICAL DATA:  Right upper quadrant pain and hypoxia. EXAM: CT ANGIOGRAPHY CHEST WITH CONTRAST TECHNIQUE: Multidetector CT imaging of the chest was performed using the standard protocol during bolus administration of intravenous contrast. Multiplanar CT image reconstructions and MIPs were obtained to evaluate the vascular anatomy. CONTRAST:  100 cc Isovue 370 COMPARISON:  None. FINDINGS: Cardiovascular: Heart is enlarged. Coronary artery calcification is noted. Patient is status post CABG Atherosclerotic calcification is noted in the wall of the thoracic aorta. No thoracic aortic dissection. 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 Pulmonary arterial opacification is suboptimal secondary to the central venous stenosis, but within this limitation there is no evidence for large central pulmonary embolus in the  main pulmonary outflow tract, main pulmonary arteries, or lobar pulmonary arteries. There is no definite pulmonary embolus in segmental and subsegmental pulmonary arteries although assessment may not be reliable given bolus timing. Mediastinum/Nodes: No mediastinal lymphadenopathy. There is no hilar lymphadenopathy. Borderline lymphadenopathy is identified in the right hilum. The esophagus has normal  imaging features. There is no axillary lymphadenopathy. Lungs/Pleura: Emphysema noted bilaterally with interlobular septal thickening and bronchial wall thickening. Dependent collapse/consolidation is identified in both lower lobes and small bilateral pleural effusions are evident. Upper Abdomen: Unremarkable. Musculoskeletal: Bone windows reveal no worrisome lytic or sclerotic osseous lesions. 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 Review of the MIP images confirms the above findings. IMPRESSION: 1. Suboptimal bolus timing due to central venous stenosis. Within this limitation, there is no evidence for large central pulmonary embolus. While no embolic disease is seen in segmental or subsegmental pulmonary arteries, evaluation may not be reliable given the bolus timing. 2. Coronary artery and thoracic aortic atherosclerosis. 3. Dependent collapse/consolidation of both lower lobes with small bilateral pleural effusions. Electronically Signed   By: Kennith Center M.D.   On: 04/16/2016 18:11   Ct Abdomen Pelvis W Contrast  Result Date: 04/15/2016 CLINICAL DATA:  Acute onset of lower abdominal pain. Initial encounter. EXAM: CT ABDOMEN AND PELVIS WITH CONTRAST TECHNIQUE: Multidetector CT imaging of the abdomen and pelvis was performed using the standard protocol following bolus administration of intravenous contrast. CONTRAST:  100 mL of Isovue 300 IV contrast COMPARISON:  None. FINDINGS: Lower chest: Minimal bibasilar scarring or atelectasis is noted. The patient is status post median sternotomy. Diffuse coronary  artery calcifications are seen. Hepatobiliary: The liver is unremarkable in appearance. The gallbladder is unremarkable in appearance. The common bile duct remains normal in caliber. Pancreas: The pancreas is within normal limits. Spleen: The spleen is unremarkable in appearance. Adrenals/Urinary Tract: Mild nodularity is noted at the adrenal glands bilaterally, nonspecific in appearance. The kidneys are within normal limits. There is no evidence of hydronephrosis. No renal or ureteral stones are identified. Nonspecific perinephric stranding is noted bilaterally. Stomach/Bowel: The stomach is unremarkable in appearance. The small bowel is within normal limits. The appendix is not visualized; there is no evidence for appendicitis. The colon is unremarkable in appearance. Vascular/Lymphatic: Diffuse calcification is seen along the abdominal aorta and its branches. There appears to be moderate to severe luminal narrowing at the common iliac arteries bilaterally. The inferior vena cava is grossly unremarkable. No retroperitoneal lymphadenopathy is seen. No pelvic sidewall lymphadenopathy is identified. Reproductive: Mild soft tissue inflammation about the bladder could reflect cystitis. The prostate is enlarged, measuring 5.1 cm in transverse dimension, with minimal calcification. Other: No additional soft tissue abnormalities are seen. Musculoskeletal: No acute osseous abnormalities are identified. Vacuum phenomenon is noted at L5-S1. The visualized musculature is unremarkable in appearance. IMPRESSION: 1. Mild soft tissue inflammation about the bladder could reflect cystitis. 2. Diffuse aortic atherosclerosis. Moderate severe luminal narrowing noted at the common iliac arteries bilaterally. 3. Enlarged prostate noted. 4. **An incidental finding of potential clinical significance has been found. Mild nodularity of the adrenal glands bilaterally, nonspecific in appearance. Would correlate with adrenal labs, and consider  adrenal protocol MRI or CT for further evaluation.** 5. Diffuse coronary artery calcifications seen. Electronically Signed   By: Roanna Raider M.D.   On: 04/15/2016 00:21    Procedures Procedures (including critical care time)  Medications Ordered in ED Medications  sodium chloride 0.9 % injection (not administered)  iopamidol (ISOVUE-370) 76 % injection (not administered)  ceFEPIme (MAXIPIME) 1 g in dextrose 5 % 50 mL IVPB (not administered)  alum & mag hydroxide-simeth (MAALOX/MYLANTA) 200-200-20 MG/5ML suspension 30 mL (not administered)  baclofen (LIORESAL) tablet 20 mg (not administered)  hydrALAZINE (APRESOLINE) tablet 100 mg (not administered)  magnesium hydroxide (MILK OF MAGNESIA) suspension 30 mL (not  administered)  amLODipine (NORVASC) tablet 10 mg (not administered)  aspirin EC tablet 325 mg (not administered)  atorvastatin (LIPITOR) tablet 80 mg (not administered)  loratadine (CLARITIN) tablet 10 mg (not administered)  clopidogrel (PLAVIX) tablet 75 mg (not administered)  hydrOXYzine (ATARAX/VISTARIL) tablet 25 mg (not administered)  labetalol (NORMODYNE) tablet 200 mg (not administered)  lactobacillus acidophilus & bulgar (LACTINEX) chewable tablet 1 tablet (not administered)  levETIRAcetam (KEPPRA) tablet 750 mg (not administered)  polyvinyl alcohol (LIQUIFILM TEARS) 1.4 % ophthalmic solution 1 drop (not administered)  sertraline (ZOLOFT) tablet 100 mg (not administered)  tiotropium (SPIRIVA) inhalation capsule 18 mcg (not administered)  zolpidem (AMBIEN) tablet 5 mg (not administered)  enoxaparin (LOVENOX) injection 40 mg (not administered)  acetaminophen (TYLENOL) tablet 650 mg (not administered)    Or  acetaminophen (TYLENOL) suppository 650 mg (not administered)  ondansetron (ZOFRAN) tablet 4 mg (not administered)    Or  ondansetron (ZOFRAN) injection 4 mg (not administered)  0.9 % NaCl with KCl 20 mEq/ L  infusion (not administered)  oxyCODONE (Oxy  IR/ROXICODONE) immediate release tablet 5 mg (not administered)  ketorolac (TORADOL) 30 MG/ML injection 30 mg (not administered)  albuterol (PROVENTIL) (2.5 MG/3ML) 0.083% nebulizer solution 2.5 mg (not administered)  vancomycin (VANCOCIN) IVPB 1000 mg/200 mL premix (not administered)  ceFEPIme (MAXIPIME) 1 g in dextrose 5 % 50 mL IVPB (not administered)  dicyclomine (BENTYL) injection 20 mg (20 mg Intramuscular Given 04/16/16 1446)  sodium chloride 0.9 % bolus 1,000 mL (0 mLs Intravenous Stopped 04/16/16 1615)  sodium chloride 0.9 % bolus 1,000 mL (0 mLs Intravenous Stopped 04/16/16 1720)  iopamidol (ISOVUE-370) 76 % injection 100 mL (100 mLs Intravenous Contrast Given 04/16/16 1750)  ipratropium-albuterol (DUONEB) 0.5-2.5 (3) MG/3ML nebulizer solution 3 mL (3 mLs Nebulization Given 04/16/16 1919)  vancomycin (VANCOCIN) IVPB 1000 mg/200 mL premix (1,000 mg Intravenous Transfusing/Transfer 04/16/16 2105)     Initial Impression / Assessment and Plan / ED Course  I have reviewed the triage vital signs and the nursing notes.  Pertinent labs & imaging results that were available during my care of the patient were reviewed by me and considered in my medical decision making (see chart for details).  Clinical Course as of Apr 17 2143  Thu Apr 16, 2016  [id number] It is still somewhat unclear, but pt seems to be relayingbthrough daughter that he fell over the weekend and strained his abdominal wall as he was getting up   [EW]    Clinical Course User Index [EW] Trixie DredgeEmily Hridhaan Yohn, PA-C    Afebrile, nontoxic patient with c/o right lower abdominal pain, which may be from strained muscle or recent GI illness - labs and UA reassuring.  CTabd/pelvis performed two days ago also reassuring.  Pt found to be hypoxic in the low 80s while in the ED.  Given patient's expressive aphasia it is unclear if there may be some symptoms.  He does appear to have increased work of breathing and family notes he gets SOB with walking  around.  CT angio chest demonstrates bilateral lower lobe collapse vs consolidation with small effusions.  Pt to be treated for HCAP with vanc and cefepime.  Nebs given.  Admitted to [facility], Dr Ophelia CharterYates accepting.         Final Clinical Impressions(s) / ED Diagnoses   Final diagnoses:  HCAP (healthcare-associated pneumonia)  Right lower quadrant abdominal pain  Expressive aphasia    New Prescriptions Current Discharge Medication List       Trixie Dredgemily Bluma Buresh, [place] JerseyPA-C 04/16/16  2144    Laurence Spates, MD 04/17/16 1116

## 2016-04-16 NOTE — ED Notes (Signed)
Called report.  Changing IV that no longer works then taking pt up.  Receiving nurse aware of varying O2 sats and that pt is now on 5L [place].  Pt currently satting in mid 90s.

## 2016-04-17 DIAGNOSIS — Z8673 Personal history of transient ischemic attack (TIA), and cerebral infarction without residual deficits: Secondary | ICD-10-CM

## 2016-04-17 DIAGNOSIS — R9431 Abnormal electrocardiogram [ECG] [EKG]: Secondary | ICD-10-CM

## 2016-04-17 DIAGNOSIS — I2510 Atherosclerotic heart disease of native coronary artery without angina pectoris: Secondary | ICD-10-CM | POA: Diagnosis present

## 2016-04-17 DIAGNOSIS — J189 Pneumonia, unspecified organism: Principal | ICD-10-CM

## 2016-04-17 DIAGNOSIS — J449 Chronic obstructive pulmonary disease, unspecified: Secondary | ICD-10-CM | POA: Diagnosis present

## 2016-04-17 DIAGNOSIS — J9601 Acute respiratory failure with hypoxia: Secondary | ICD-10-CM | POA: Diagnosis present

## 2016-04-17 DIAGNOSIS — J441 Chronic obstructive pulmonary disease with (acute) exacerbation: Secondary | ICD-10-CM | POA: Diagnosis present

## 2016-04-17 DIAGNOSIS — I10 Essential (primary) hypertension: Secondary | ICD-10-CM

## 2016-04-17 DIAGNOSIS — R1031 Right lower quadrant pain: Secondary | ICD-10-CM

## 2016-04-17 LAB — BASIC METABOLIC PANEL
Anion gap: 6 (ref 5–15)
BUN: 12 mg/dL (ref 6–20)
CHLORIDE: 105 mmol/L (ref 101–111)
CO2: 27 mmol/L (ref 22–32)
CREATININE: 0.88 mg/dL (ref 0.61–1.24)
Calcium: 7.8 mg/dL — ABNORMAL LOW (ref 8.9–10.3)
Glucose, Bld: 98 mg/dL (ref 65–99)
Potassium: 3.1 mmol/L — ABNORMAL LOW (ref 3.5–5.1)
SODIUM: 138 mmol/L (ref 135–145)

## 2016-04-17 LAB — MAGNESIUM: Magnesium: 1.9 mg/dL (ref 1.7–2.4)

## 2016-04-17 LAB — CBC
HEMATOCRIT: 36.1 % — AB (ref 39.0–52.0)
HEMOGLOBIN: 12.3 g/dL — AB (ref 13.0–17.0)
MCH: 28.6 pg (ref 26.0–34.0)
MCHC: 34.1 g/dL (ref 30.0–36.0)
MCV: 84 fL (ref 78.0–100.0)
Platelets: 191 10*3/uL (ref 150–400)
RBC: 4.3 MIL/uL (ref 4.22–5.81)
RDW: 13.8 % (ref 11.5–15.5)
WBC: 10.8 10*3/uL — ABNORMAL HIGH (ref 4.0–10.5)

## 2016-04-17 LAB — TROPONIN I
Troponin I: <0.03 ng/mL (ref <0.03)
Troponin I: <0.03 ng/mL (ref <0.03)
Troponin I: <0.03 ng/mL (ref <0.03)

## 2016-04-17 LAB — LACTIC ACID, PLASMA: LACTIC ACID, VENOUS: 0.7 mmol/L (ref 0.5–1.9)

## 2016-04-17 MED ORDER — POTASSIUM CHLORIDE CRYS ER 20 MEQ PO TBCR: 40.0000 meq | EXTENDED_RELEASE_TABLET | ORAL | Status: DC

## 2016-04-17 MED ORDER — BUDESONIDE 0.25 MG/2ML IN SUSP
0.2500 mg | Freq: Two times a day (BID) | RESPIRATORY_TRACT | Status: DC
  Administered 2016-04-17 – 2016-04-22 (×11): 0.25 mg via RESPIRATORY_TRACT
  Filled 2016-04-17 (×12): qty 2

## 2016-04-17 MED ORDER — POTASSIUM CHLORIDE 20 MEQ/15ML (10%) PO SOLN
40.0000 meq | ORAL | Status: AC
  Administered 2016-04-17 (×2): 40 meq via ORAL
  Filled 2016-04-17 (×4): qty 30

## 2016-04-17 MED ORDER — IPRATROPIUM-ALBUTEROL 0.5-2.5 (3) MG/3ML IN SOLN
3.0000 mL | Freq: Four times a day (QID) | RESPIRATORY_TRACT | Status: DC
  Administered 2016-04-17 (×3): 3 mL via RESPIRATORY_TRACT
  Filled 2016-04-17 (×2): qty 3

## 2016-04-17 MED ORDER — IPRATROPIUM-ALBUTEROL 0.5-2.5 (3) MG/3ML IN SOLN
3.0000 mL | Freq: Three times a day (TID) | RESPIRATORY_TRACT | Status: DC
  Administered 2016-04-18 – 2016-04-22 (×13): 3 mL via RESPIRATORY_TRACT
  Filled 2016-04-17 (×15): qty 3

## 2016-04-17 NOTE — [place] FL2 (Addendum)
[place] MEDICAID FL2 LEVEL OF CARE SCREENING TOOL     IDENTIFICATION  Patient Name: Daniel JacobKirk Curington Birthdate: 11-17-1955 Sex: male Admission Date (Current Location): 04/16/2016  [facility] [place]   Facility and Address:  [facility],  [address], [place] [id number]      Provider Number: [id number]  Attending Physician Name and Address:  Rodolph Bonganiel V Thompson, MD  Relative Name and Phone Number:       Current Level of Care: Hospital Recommended Level of Care: Assisted Living Facility Prior Approval Number:    Date Approved/Denied:   PASRR Number:    Discharge Plan:  (Assited Living Facility )    Current Diagnoses: Patient Active Problem List   Diagnosis Date Noted  . Acute respiratory failure with hypoxia (HCC) 04/17/2016  . Abdominal pain, acute, right lower quadrant 04/17/2016  . Nonspecific abnormal electrocardiogram (ECG) (EKG) 04/17/2016  . H/O: CVA (cerebrovascular accident) 04/17/2016  . COPD (chronic obstructive pulmonary disease) (HCC) 04/17/2016  . CAD (coronary artery disease) 04/17/2016  . Essential hypertension 04/17/2016  . HCAP (healthcare-associated pneumonia) 04/16/2016    Orientation RESPIRATION BLADDER Height & Weight     Self, Time, Situation, Place  O2 (1L) Continent Weight: 197 lb 5 oz (89.5 kg) Height:  5\' 10"  (177.8 cm)  BEHAVIORAL SYMPTOMS/MOOD NEUROLOGICAL BOWEL NUTRITION STATUS      Continent Diet (See d/c summary)  AMBULATORY STATUS COMMUNICATION OF NEEDS Skin   Independent Verbally Normal                       Personal Care Assistance Level of Assistance  Bathing, Feeding, Dressing Bathing Assistance: Independent Feeding assistance: Independent Dressing Assistance: Independent     Functional Limitations Info  Sight, Hearing, Speech Sight Info: Impaired Hearing Info: Adequate Speech Info: Adequate    SPECIAL CARE FACTORS FREQUENCY  PT (By licensed PT)  OT (By licensed OT)  5  5                 Contractures Contractures Info: Not present    Additional Factors Info  Allergies, Code Status, Psychotropic Code Status Info: DNR Allergies Info: No Known Allergies           Current Medications (04/17/2016):  This is the current hospital active medication list Current Facility-Administered Medications  Medication Dose Route Frequency Provider Last Rate Last Dose  . 0.9 % NaCl with KCl 20 mEq/ L  infusion   Intravenous Continuous Jonah BlueJennifer Yates, MD 75 mL/hr at 04/17/16 1345    . acetaminophen (TYLENOL) tablet 650 mg  650 mg Oral Q6H PRN Jonah BlueJennifer Yates, MD       Or  . acetaminophen (TYLENOL) suppository 650 mg  650 mg Rectal Q6H PRN Jonah BlueJennifer Yates, MD      . albuterol (PROVENTIL) (2.5 MG/3ML) 0.083% nebulizer solution 2.5 mg  2.5 mg Nebulization Q2H PRN Jonah BlueJennifer Yates, MD      . alum & mag hydroxide-simeth (MAALOX/MYLANTA) 200-200-20 MG/5ML suspension 30 mL  30 mL Oral PRN Jonah BlueJennifer Yates, MD      . aspirin EC tablet 325 mg  325 mg Oral Q breakfast Jonah BlueJennifer Yates, MD   325 mg at 04/17/16 0732  . atorvastatin (LIPITOR) tablet 80 mg  80 mg Oral QHS Jonah BlueJennifer Yates, MD   80 mg at 04/16/16 2234  . baclofen (LIORESAL) tablet 20 mg  20 mg Oral TID Jonah BlueJennifer Yates, MD   20 mg at 04/17/16 [id number]  . budesonide (PULMICORT) nebulizer  solution 0.25 mg  0.25 mg Nebulization BID Rodolph Bong, MD   0.25 mg at 04/17/16 0954  . ceFEPIme (MAXIPIME) 1 g in dextrose 5 % 50 mL IVPB  1 g Intravenous Once Rollene Fare, RPH      . ceFEPIme (MAXIPIME) 1 g in dextrose 5 % 50 mL IVPB  1 g Intravenous Q8H Rollene Fare, RPH   1 g at 04/17/16 1411  . clopidogrel (PLAVIX) tablet 75 mg  75 mg Oral Q breakfast Jonah Blue, MD   75 mg at 04/17/16 0732  . enoxaparin (LOVENOX) injection 40 mg  40 mg Subcutaneous Q24H Jonah Blue, MD   40 mg at 04/16/16 2236  . hydrALAZINE (APRESOLINE) tablet 100 mg  100 mg Oral BID Jonah Blue, MD   100 mg at 04/16/16 2234  . hydrOXYzine  (ATARAX/VISTARIL) tablet 25 mg  25 mg Oral Q6H PRN Jonah Blue, MD      . ipratropium-albuterol (DUONEB) 0.5-2.5 (3) MG/3ML nebulizer solution 3 mL  3 mL Nebulization Q6H Rodolph Bong, MD   3 mL at 04/17/16 1455  . ketorolac (TORADOL) 30 MG/ML injection 30 mg  30 mg Intravenous Q6H PRN Jonah Blue, MD   30 mg at 04/17/16 1610  . lactobacillus acidophilus & bulgar (LACTINEX) chewable tablet 1 tablet  1 tablet Oral Q breakfast Jonah Blue, MD   1 tablet at 04/17/16 [phone]  . levETIRAcetam (KEPPRA) tablet 750 mg  750 mg Oral BID Jonah Blue, MD   750 mg at 04/17/16 5409  . loratadine (CLARITIN) tablet 10 mg  10 mg Oral Daily Jonah Blue, MD   10 mg at 04/17/16 0936  . magnesium hydroxide (MILK OF MAGNESIA) suspension 30 mL  30 mL Oral QHS PRN Jonah Blue, MD      . nystatin (MYCOSTATIN) 100000 UNIT/ML suspension 500,000 Units  5 mL Oral QID Jonah Blue, MD   500,000 Units at 04/17/16 1410  . ondansetron (ZOFRAN) tablet 4 mg  4 mg Oral Q6H PRN Jonah Blue, MD       Or  . ondansetron [facility]) injection 4 mg  4 mg Intravenous Q6H PRN Jonah Blue, MD      . oxyCODONE (Oxy IR/ROXICODONE) immediate release tablet 5 mg  5 mg Oral Q4H PRN Jonah Blue, MD   5 mg at 04/17/16 0510  . polyvinyl alcohol (LIQUIFILM TEARS) 1.4 % ophthalmic solution 1 drop  1 drop Both Eyes PRN Jonah Blue, MD      . sertraline (ZOLOFT) tablet 100 mg  100 mg Oral Q breakfast Jonah Blue, MD   100 mg at 04/17/16 0733  . vancomycin (VANCOCIN) IVPB 1000 mg/200 mL premix  1,000 mg Intravenous Q12H Rollene Fare, RPH   1,000 mg at 04/17/16 0941  . zolpidem (AMBIEN) tablet 5 mg  5 mg Oral QHS PRN,MR X 1 Jonah Blue, MD         Discharge Medications: Please see discharge summary for a list of discharge medications.  Medication List    TAKE these medications   acetaminophen 500 MG tablet Commonly known as:  TYLENOL Take 500 mg by mouth every 4 (four) hours as needed for mild pain,  moderate pain, fever or headache.  albuterol (2.5 MG/3ML) 0.083% nebulizer solution Commonly known as:  PROVENTIL Take 3 mLs (2.5 mg total) by nebulization every 4 (four) hours as needed for wheezing or shortness of breath.  amLODipine 10 MG tablet Commonly known as:  NORVASC Take 10 mg by mouth at  bedtime.  aspirin 325 MG EC tablet Take 325 mg by mouth daily with breakfast.  atorvastatin 80 MG tablet Commonly known as:  LIPITOR Take 80 mg by mouth at bedtime.  baclofen 20 MG tablet Commonly known as:  LIORESAL Take 20 mg by mouth 3 (three) times daily.  budesonide 0.25 MG/2ML nebulizer solution Commonly known as:  PULMICORT Take 2 mLs (0.25 mg total) by nebulization 2 (two) times daily.  cetirizine 10 MG tablet Commonly known as:  ZYRTEC Take 10 mg by mouth daily with breakfast.  clopidogrel 75 MG tablet Commonly known as:  PLAVIX Take 75 mg by mouth daily with breakfast.  dicyclomine 20 MG tablet Commonly known as:  BENTYL Take 1 tablet (20 mg total) by mouth 2 (two) times daily. Take for abdominal pain/cramping  hydrALAZINE 100 MG tablet Commonly known as:  APRESOLINE Take 100 mg by mouth 2 (two) times daily.  hydrOXYzine 25 MG tablet Commonly known as:  ATARAX/VISTARIL Take 25 mg by mouth every 6 (six) hours as needed for itching.  labetalol 200 MG tablet Commonly known as:  NORMODYNE Take 200 mg by mouth 2 (two) times daily.  lactobacillus acidophilus & bulgar chewable tablet Chew 1 tablet by mouth daily with breakfast.  levETIRAcetam 750 MG tablet Commonly known as:  KEPPRA Take 750 mg by mouth 2 (two) times daily.  levofloxacin 750 MG tablet Commonly known as:  LEVAQUIN Take 1 tablet (750 mg total) by mouth daily.  loperamide 2 MG capsule Commonly known as:  IMODIUM Take by mouth as needed for diarrhea or loose stools.  losartan 50 MG tablet Commonly known as:  COZAAR Take 50 mg by mouth daily with breakfast.  magnesium hydroxide 400 MG/5ML  suspension Commonly known as:  MILK OF MAGNESIA Take 30 mLs by mouth at bedtime as needed for mild constipation.  MINTOX 200-200-20 MG/5ML suspension Generic drug:  alum & mag hydroxide-simeth Take 30 mLs by mouth as needed for indigestion or heartburn.  neomycin-bacitracin-polymyxin ointment Commonly known as:  NEOSPORIN Apply 1 application topically as needed for wound care.  ondansetron 4 MG disintegrating tablet Commonly known as:  ZOFRAN ODT Take 1 tablet (4 mg total) by mouth every 8 (eight) hours as needed for nausea or vomiting.  polyethylene glycol powder powder Commonly known as:  GLYCOLAX/MIRALAX Take 17 g by mouth 2 (two) times daily. Use for constipation, as prescribed, until daily soft stools  OTC  ROBAFEN 100 MG/5ML syrup Generic drug:  guaifenesin Take 200 mg by mouth every 6 (six) hours as needed for cough.  sertraline 100 MG tablet Commonly known as:  ZOLOFT Take 100 mg by mouth daily with breakfast.  tamsulosin 0.4 MG Caps capsule Commonly known as:  FLOMAX Take 1 capsule (0.4 mg total) by mouth daily.  tiotropium 18 MCG inhalation capsule Commonly known as:  SPIRIVA Place 18 mcg into inhaler and inhale daily.  vitamin B-12 1000 MCG tablet Commonly known as:  CYANOCOBALAMIN Take 1,000 mcg by mouth daily with breakfast.  Vitamin D 2000 units tablet Take 2,000 Units by mouth daily with breakfast.  zolpidem 12.5 MG CR tablet Commonly known as:  AMBIEN CR Take 12.5 mg by mouth at bedtime.     Relevant Imaging Results:  Relevant Lab Results:   Additional Information ss#[id number]  Clearance CootsNicole A Sinclair, LCSW

## 2016-04-17 NOTE — Clinical Social Work Note (Signed)
Clinical Social Work Assessment  Patient Details  Name: Daniel Oconnell MRN: [id number] Date of Birth: 1956/04/22  Date of referral:  04/17/16               Reason for consult:  Facility Placement [facility])                Permission sought to share information with:  Family Supports, Customer service manager Permission granted to share information::     Name::      Manuela Schwartz [place]/ [place]::   [facility]  Relationship::   Friend/ Daughter (POA)  Contact Information:   [phone]/ [phone]  Housing/Transportation Living arrangements for the past 2 months:  [place] of Information:  Patient, Other (Comment Required) (Ex-Wife) Patient Interpreter Needed:  None Criminal Activity/Legal Involvement Pertinent to Current Situation/Hospitalization:  No - Comment as needed Significant Relationships:  Adult Children, Spouse Lives with:  Self Do you feel safe going back to the place where you live?  Yes Need for family participation in patient care:  Yes (Comment)  Care giving concerns: Facility resident at [facility]. Pt. And family agreeable patient will return to facility once medically stable. Patient family has agreed to transport patient back to facility.    Social Worker assessment / plan:  LCSWA met with patient and ex-spouse -Maxon Kresse at bedside. She reports the patient daughter Joycelyn Schmid and Son are POA.   At this time the plan is for patient to return to facility. Pt. ambulates well and completes his own ADL's. The patient is hoping to move into independent living in the near future. Patient requested information about independent living.    LCSWA provided pt. And family information about independent living facilities.   Plan: Assist with patient disposition/ [place] will assist with pt. Disposition.  FL2 completed.   Employment status:  Retired Nurse, adult PT Recommendations:  Not  assessed at this time Information / Referral to community resources:  [place]  Patient/Family's Response to care:  Agreeable and responding well to care.   Patient/Family's Understanding of and Emotional Response to Diagnosis, Current Treatment, and Prognosis: Patient and ex. Spouse have a understiand of patient diagnosis and medical condition. They plan to continue treatment at ALF.   Emotional Assessment Appearance:  Appears stated age Attitude/Demeanor/Rapport:    Affect (typically observed):  Calm, Pleasant Orientation:  Oriented to Self, Oriented to Place, Oriented to  Time, Oriented to Situation Alcohol / Substance use:  Not Applicable Psych involvement (Current and /or in the community):  No (Comment)  Discharge Needs  Concerns to be addressed:  Discharge Planning Concerns Readmission within the last 30 days:    Current discharge risk:  None Barriers to Discharge:  Continued Medical Work up   [employer], LCSW 04/17/2016, 1:55 PM

## 2016-04-17 NOTE — Progress Notes (Signed)
PROGRESS NOTE    Dontrez Pettis  ZOX:[id number] DOB: 1956-05-07 DOA: 04/16/2016 PCP: No primary care provider on file.    Brief Narrative:   Daniel Oconnell is a 60 y.o. 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  History was obtained from his daughter as well as from the patient in a kind of charades game - he is able to say yes and no (reasonably accurately) and can make a few other intelligible sounds and also uses gestures and facial expressions.  He reports feeling nauseated Saturday night, explosive diarrhea for several days.  Fell Saturday night.  Developed RLQ abdominal pain.  Worsened to the point Tuesday that he felt the need to call 911 and come to ER.  Urine culture negative, CT unremarkable.  O2 levels 83-84% before discharge, but since he has COPD they discharged him.  Last night, pain worse again. Got meds overnight but still hurting this AM.  He once again was sent back to the ER. Pain worse with moving, elevation of bed.  No emesis with prior stomach bug, uncertain if he has a gag reflex.  No cough.  Possibly had fever on Saturday, Sunday.       Assessment & Plan:   Principal Problem:   Acute respiratory failure with hypoxia (HCC) Active Problems:   HCAP (healthcare-associated pneumonia)   Abdominal pain, acute, right lower quadrant   Nonspecific abnormal electrocardiogram (ECG) (EKG)   H/O: CVA (cerebrovascular accident)   COPD (chronic obstructive pulmonary disease) (HCC)   CAD (coronary artery disease)   Essential hypertension   #1 acute respiratory failure with hypoxia likely secondary to healthcare associated pneumonia Patient had presented with hypoxia with sats in the 80s. ABG showed a metabolic alkalosis with a rest area acidosis. Patient with history of COPD however PCO2 was normal less likely to be date urology. Patient  with a poor cough reflex secondary to history of CVA and aphasia. Chest x-ray was negative for any infiltrate. CT angiogram did show atelectasis versus infiltrate. Patient was placed empirically on IV antibiotics for probable healthcare associated pneumonia. Blood cultures pending. WBC trending down. Patient afebrile. Patient with clinical improvement. Follow.  #2 abdominal pain Questionable etiology. Patient would recent CT scan that showed probable cystitis. CT also showed possible adrenal nodularity. Patient currently asymptomatic. Urinalysis was nitrite negative leukocytes negative. Urine culture pending. Continue empiric IV antibiotics. Supportive care.  #3 abnormal EKG/history of coronary artery disease status post CABG EKG with T-wave inversions in leads 1, aVL, V3-V6. Patient denies any chest pain. Cardiac enzymes negative 3. Check a 2-D echo. Continue aspirin, Lipitor, Plavix, hydralazine. Norvasc and labetalol discontinued secondary to borderline blood pressure. Follow. Once blood pressure improves will resume beta blocker and calcium channel blocker.  #4 hypertension Blood pressure borderline. Patient was both on lisinopril and losartan. Low son was held. Norvasc and labetalol on hold secondary to borderline blood pressure. Continue hydralazine. Once blood pressure improves labetalol and Norvasc may be resumed.  #5 history of CVA Patient with dysarthric speech. Continue aspirin and Plavix for secondary stroke prevention. PT/OT/ST.   DVT prophylaxis: Lovenox and Code Status: DO NOT RESUSCITATE Family Communication: Updated patient and family at bedside. Disposition Plan: Home when medically improved and hypoxia has improved.   Consultants:   None  Procedures:   CT angiogram chest 04/16/2016  Chest x-ray 04/16/2016  Antimicrobials:   IV cefepime 04/16/2016  IV vancomycin  04/16/2016   Subjective: Dysarthric speech. Patient denies chest pain. Patient states some  improvement with shortness of breath. Patient denies any abdominal pain. Sitting up in chair watching a movie intently.  Objective: Vitals:   04/17/16 0634 04/17/16 0800 04/17/16 0957 04/17/16 0958  BP: 99/65 114/67  (!) 97/55  Pulse: (!) 50 (!) 50    Resp: 17 20    Temp: 98.8 F (37.1 C) 97.8 F (36.6 C)    TempSrc: Oral Oral    SpO2: 94% 95% 98%   Weight:      Height:        Intake/Output Summary (Last 24 hours) at 04/17/16 1217 Last data filed at 04/17/16 0941  Gross per 24 hour  Intake              980 ml  Output              175 ml  Net              805 ml   Filed Weights   04/16/16 2125  Weight: 89.5 kg (197 lb 5 oz)    Examination:  General exam: Appears calm and comfortable  Respiratory system: Some coarse bibasilar breath sounds otherwise clear. No wheezing. Respiratory effort normal. Cardiovascular system: S1 & S2 heard, RRR. No JVD, murmurs, rubs, gallops or clicks. No pedal edema. Gastrointestinal system: Abdomen is nondistended, soft and nontender. No organomegaly or masses felt. Normal bowel sounds heard. Central nervous system: Alert and oriented. No focal neurological deficits. Extremities: Symmetric 5 x 5 power. Skin: No rashes, lesions or ulcers Psychiatry: Judgement and insight appear normal. Mood & affect appropriate.     Data Reviewed: I have personally reviewed following labs and imaging studies  CBC:  Recent Labs Lab 04/14/16 2218 04/16/16 1317 04/17/16 0151  WBC 14.6* 11.5* 10.8*  NEUTROABS 11.6* 9.3*  --   HGB 14.5 13.4 12.3*  HCT 41.4 39.2 36.1*  MCV 83.3 84.8 84.0  PLT 199 197 191   Basic Metabolic Panel:  Recent Labs Lab 04/14/16 2218 04/16/16 1317 04/17/16 0151 04/17/16 0843  NA 138 139 138  --   K 3.4* 3.5 3.1*  --   CL 106 104 105  --   CO2 23 29 27   --   GLUCOSE 103* 110* 98  --   BUN 17 14 12   --   CREATININE 1.08 0.98 0.88  --   CALCIUM 8.5* 8.3* 7.8*  --   MG  --   --   --  1.9   GFR: Estimated Creatinine  Clearance: 98 mL/min (by C-G formula based on SCr of 0.88 mg/dL). Liver Function Tests:  Recent Labs Lab 04/14/16 2218 04/16/16 1317  AST 18 18  ALT 18 17  ALKPHOS 69 60  BILITOT 1.2 1.2  PROT 6.7 6.3*  ALBUMIN 3.8 3.5    Recent Labs Lab 04/14/16 2218 04/16/16 1317  LIPASE 18 22   No results for input(s): AMMONIA in the last 168 hours. Coagulation Profile:  Recent Labs Lab 04/16/16 2208  INR 1.20   Cardiac Enzymes:  Recent Labs Lab 04/17/16 0151 04/17/16 0843  TROPONINI <0.03 <0.03   BNP (last 3 results) No results for input(s): PROBNP in the last 8760 hours. HbA1C: No results for input(s): HGBA1C in the last 72 hours. CBG: No results for input(s): GLUCAP in the last 168 hours. Lipid Profile: No results for input(s): CHOL, HDL, LDLCALC, TRIG, CHOLHDL, LDLDIRECT in the last 72 hours. Thyroid Function Tests: No  results for input(s): TSH, T4TOTAL, FREET4, T3FREE, THYROIDAB in the last 72 hours. Anemia Panel: No results for input(s): VITAMINB12, FOLATE, FERRITIN, TIBC, IRON, RETICCTPCT in the last 72 hours. Sepsis Labs:  Recent Labs Lab 04/16/16 2023 04/16/16 2208 04/16/16 2213 04/17/16 0151  PROCALCITON  --  <0.10  --   --   LATICACIDVEN 0.63  --  1.1 0.7    Recent Results (from the past 240 hour(s))  Urine culture     Status: Abnormal   Collection Time: 04/15/16  2:13 AM  Result Value Ref Range Status   Specimen Description URINE, CLEAN CATCH  Final   Special Requests NONE  Final   Culture (A)  Final    <10,000 COLONIES/mL INSIGNIFICANT GROWTH Performed at [facility]    Report Status 04/16/2016 FINAL  Final  MRSA PCR Screening     Status: None   Collection Time: 04/16/16 10:11 PM  Result Value Ref Range Status   MRSA by PCR NEGATIVE NEGATIVE Final    Comment:        The GeneXpert MRSA Assay (FDA approved for NASAL specimens only), is one component of a comprehensive MRSA colonization surveillance program. It is not intended to  diagnose MRSA infection nor to guide or monitor treatment for MRSA infections.   Culture, blood (x 2)     Status: None (Preliminary result)   Collection Time: 04/16/16 10:13 PM  Result Value Ref Range Status   Specimen Description BLOOD LEFT HAND  Final   Special Requests BOTTLES DRAWN AEROBIC AND ANAEROBIC 6CC  Final   Culture   Final    NO GROWTH < 12 HOURS Performed at [facility]    Report Status PENDING  Incomplete  Culture, blood (x 2)     Status: None (Preliminary result)   Collection Time: 04/16/16 10:13 PM  Result Value Ref Range Status   Specimen Description BLOOD LEFT ARM  Final   Special Requests IN PEDIATRIC BOTTLE 2CC  Final   Culture   Final    NO GROWTH < 12 HOURS Performed at [facility]    Report Status PENDING  Incomplete         Radiology Studies: Dg Chest 2 View  Result Date: 04/16/2016 CLINICAL DATA:  Hypoxia EXAM: CHEST  2 VIEW COMPARISON:  None. FINDINGS: Borderline cardiomegaly. Status post CABG. There is mild interstitial prominence bilaterally without convincing pulmonary edema. Osteopenia and mild degenerative changes thoracic spine. No segmental infiltrate. IMPRESSION: Mild interstitial prominence bilateral without convincing pulmonary edema. No segmental infiltrate. Electronically Signed   By: Natasha Mead M.D.   On: 04/16/2016 14:37   Ct Angio Chest Pe W And/or Wo Contrast  Result Date: 04/16/2016 CLINICAL DATA:  Right upper quadrant pain and hypoxia. EXAM: CT ANGIOGRAPHY CHEST WITH CONTRAST TECHNIQUE: Multidetector CT imaging of the chest was performed using the standard protocol during bolus administration of intravenous contrast. Multiplanar CT image reconstructions and MIPs were obtained to evaluate the vascular anatomy. CONTRAST:  100 cc Isovue 370 COMPARISON:  None. FINDINGS: Cardiovascular: Heart is enlarged. Coronary artery calcification is noted. Patient is status post CABG Atherosclerotic calcification is noted in the wall  of the thoracic aorta. No thoracic aortic dissection. 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 Pulmonary arterial opacification is suboptimal secondary to the central venous stenosis, but within this limitation there is no evidence for large central pulmonary embolus in  the main pulmonary outflow tract, main pulmonary arteries, or lobar pulmonary arteries. There is no definite pulmonary embolus in segmental and subsegmental pulmonary arteries although assessment may not be reliable given bolus timing. Mediastinum/Nodes: No mediastinal lymphadenopathy. There is no hilar lymphadenopathy. Borderline lymphadenopathy is identified in the right hilum. The esophagus has normal imaging features. There is no axillary lymphadenopathy. Lungs/Pleura: Emphysema noted bilaterally with interlobular septal thickening and bronchial wall thickening. Dependent collapse/consolidation is identified in both lower lobes and small bilateral pleural effusions are evident. Upper Abdomen: Unremarkable. Musculoskeletal: Bone windows reveal no worrisome lytic or sclerotic osseous lesions. 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 Review of the MIP images confirms the above findings. IMPRESSION: 1. Suboptimal bolus timing due to central venous stenosis. Within this limitation, there is no evidence for large central pulmonary embolus. While no embolic disease is seen in segmental or subsegmental pulmonary arteries, evaluation may not be reliable given the bolus timing. 2. Coronary artery and thoracic aortic atherosclerosis. 3. Dependent collapse/consolidation of both lower lobes with small bilateral pleural effusions. Electronically Signed   By: Kennith CenterEric  Mansell M.D.   On: 04/16/2016 18:11        Scheduled Meds: . aspirin  325 mg Oral Q breakfast  . atorvastatin  80 mg Oral QHS  . baclofen  20 mg  Oral TID  . budesonide (PULMICORT) nebulizer solution  0.25 mg Nebulization BID  . ceFEPime (MAXIPIME) IV  1 g Intravenous Once  . ceFEPime (MAXIPIME) IV  1 g Intravenous Q8H  . clopidogrel  75 mg Oral Q breakfast  . enoxaparin (LOVENOX) injection  40 mg Subcutaneous Q24H  . hydrALAZINE  100 mg Oral BID  . ipratropium-albuterol  3 mL Nebulization Q6H  . lactobacillus acidophilus & bulgar  1 tablet Oral Q breakfast  . levETIRAcetam  750 mg Oral BID  . loratadine  10 mg Oral Daily  . nystatin  5 mL Oral QID  . potassium chloride  40 mEq Oral Q4H  . sertraline  100 mg Oral Q breakfast  . vancomycin  1,000 mg Intravenous Q12H   Continuous Infusions: . 0.9 % NaCl with KCl 20 mEq / L 75 mL/hr at 04/16/16 2308     LOS: 1 day    Time spent: 35 minutes    THOMPSON,DANIEL, MD [facility] Pager [phone]  If 7PM-7AM, please contact night-coverage [email or web address] Password [email or web address] 04/17/2016, 12:17 PM

## 2016-04-18 DIAGNOSIS — R4701 Aphasia: Secondary | ICD-10-CM

## 2016-04-18 DIAGNOSIS — E279 Disorder of adrenal gland, unspecified: Secondary | ICD-10-CM

## 2016-04-18 LAB — CBC
HEMATOCRIT: 36.7 % — AB (ref 39.0–52.0)
Hemoglobin: 12.7 g/dL — ABNORMAL LOW (ref 13.0–17.0)
MCH: 29.1 pg (ref 26.0–34.0)
MCHC: 34.6 g/dL (ref 30.0–36.0)
MCV: 84 fL (ref 78.0–100.0)
PLATELETS: 203 10*3/uL (ref 150–400)
RBC: 4.37 MIL/uL (ref 4.22–5.81)
RDW: 13.6 % (ref 11.5–15.5)
WBC: 9.1 10*3/uL (ref 4.0–10.5)

## 2016-04-18 LAB — BASIC METABOLIC PANEL
ANION GAP: 8 (ref 5–15)
BUN: 11 mg/dL (ref 6–20)
CO2: 27 mmol/L (ref 22–32)
Calcium: 7.8 mg/dL — ABNORMAL LOW (ref 8.9–10.3)
Chloride: 105 mmol/L (ref 101–111)
Creatinine, Ser: 0.8 mg/dL (ref 0.61–1.24)
GFR calc Af Amer: >60 mL/min (ref >60)
GLUCOSE: 96 mg/dL (ref 65–99)
POTASSIUM: 3.6 mmol/L (ref 3.5–5.1)
Sodium: 140 mmol/L (ref 135–145)

## 2016-04-18 LAB — URINE CULTURE: Culture: NO GROWTH

## 2016-04-18 MED ORDER — POTASSIUM CHLORIDE CRYS ER 20 MEQ PO TBCR
40.0000 meq | EXTENDED_RELEASE_TABLET | Freq: Once | ORAL | Status: AC
  Administered 2016-04-18: 40 meq via ORAL
  Filled 2016-04-18: qty 2

## 2016-04-18 NOTE — Evaluation (Signed)
Clinical/Bedside Swallow Evaluation Patient Details  Name: Daniel Oconnell MRN: [id number] Date of Birth: Dec 28, 1955  Today's Date: 04/18/2016 Time: SLP Start Time (ACUTE ONLY): [id number] SLP Stop Time (ACUTE ONLY): 0920 SLP Time Calculation (min) (ACUTE ONLY): 15 min  Past Medical History:  Past Medical History:  Diagnosis Date  . 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 CAD (coronary artery disease) 2001   CABG  . COPD (chronic obstructive pulmonary disease) (HCC)   . CVA (cerebral vascular accident) (HCC) 2001, 2013   x2  . Expressive aphasia   . 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 Hypertension    Past Surgical History:  Past Surgical History:  Procedure Laterality Date  . APPENDECTOMY    . CORONARY ARTERY BYPASS GRAFT  2001  . LUMBAR SPINE SURGERY     HPI:  Patient is a 60 y.o. 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    Assessment / Plan / Recommendation Clinical Impression  Patient presents with a functional oropharyngeal swallow although he may have a mild swallow initiation delay, but did not present with any overt s/s of aspiration or difficulty with oral or pharyngeal phases of swallow. Per report from both RN and patient's son, suspect that patient had an isolated event of dysphagia secondary to being almost fully reclined when eating yesterday. Patient able to follow commands appropriately and was able to safely and effectively manage oral intake of solids and liquids.     Aspiration Risk  Mild aspiration risk    Diet Recommendation Thin liquid;Regular   Liquid Administration via: Cup;Straw Medication Administration: Whole meds with liquid Supervision: Patient able to self feed;Intermittent supervision to cue for compensatory strategies Compensations: Minimize environmental distractions;Slow rate;Small  sips/bites Postural Changes: Seated upright at 90 degrees    Other  Recommendations Oral Care Recommendations: Oral care BID   Follow up Recommendations Other (comment) (assist at ALF with meal prep (cutting food, etc))      Frequency and Duration            Prognosis        Swallow Study   General Date of Onset: 04/16/16 HPI: Patient is a 60 y.o. 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  Type of Study: Bedside Swallow Evaluation Previous Swallow Assessment: N/A Diet Prior to this Study: Regular;Thin liquids Temperature Spikes Noted: No Respiratory Status: Nasal cannula History of Recent Intubation: No Behavior/Cognition: Alert;Cooperative;Pleasant mood Oral Cavity Assessment: Within Functional Limits Oral Care Completed by SLP: No Oral Cavity - Dentition: Adequate natural dentition Vision: Functional for self-feeding Self-Feeding Abilities: Needs assist;Needs set up Patient Positioning: Upright in bed Baseline Vocal Quality: Normal Volitional Cough: Cognitively unable to elicit Volitional Swallow: Unable to elicit    Oral/Motor/Sensory Function Overall Oral Motor/Sensory Function: Within functional limits   Ice Chips     Thin Liquid Thin Liquid: Within functional limits Presentation: Cup;Straw;Self Fed Other Comments: No overt s/s of aspiration observed    Nectar Thick     Honey Thick     Puree Puree: Not tested   Solid   GO   Solid: Impaired Presentation: Self Fed Pharyngeal Phase Impairments: Suspected delayed Swallow Other Comments: no difficulty, residuals or s/s aspiration with hard solid (graham crackers)       Daniel NevinJohn T. Cordarro Spinnato,  MA, CCC-SLP 04/18/16 10:53 AM

## 2016-04-18 NOTE — Evaluation (Signed)
Physical Therapy Evaluation Patient Details Name: Daniel JacobKirk Keagle MRN: [id number] DOB: 10-Nov-1955 Today's Date: 04/18/2016   History of Present Illness  Pt admitted with acute abdominal pain, HCAP and resp failure.  Pt with hx of COPD, CAD, CABG and CVA with residual effects including homonymous hemianopsia, apraxia, expressive aphasia, decreased R side sensation, limited use of R hand and apraxia of R side  Clinical Impression  Pt admitted as above and presenting with functional mobility limitations 2* endurance/SOB with exertion and balance deficits associated with recent inactivity and residual affects of previous CVA.  Pt should progress to return to previous living arrangement at ALF.    Follow Up Recommendations No PT follow up    Equipment Recommendations  None recommended by PT    Recommendations for Other Services       Precautions / Restrictions Precautions Precautions: Fall Restrictions Weight Bearing Restrictions: No      Mobility  Bed Mobility Overal bed mobility: Modified Independent             General bed mobility comments: Pt to EOB unassisted  Transfers Overall transfer level: Needs assistance Equipment used: None Transfers: Sit to/from Stand Sit to Stand: Min guard         General transfer comment: balance with initial standing; cues for safe transition position  Ambulation/Gait Ambulation/Gait assistance: Min assist;Min guard Ambulation Distance (Feet): 200 Feet Assistive device: 1 person hand held assist Gait Pattern/deviations: Step-through pattern;Shuffle;Wide base of support Gait velocity: decr Gait velocity interpretation: Below normal speed for age/gender General Gait Details: Increased BOS and decreased knee flexion with R swing phase.  Multiple short rests 2* SOB - pt maintained SaO2 >90% on 4L until brief drop to 88% at end of task  Stairs            Wheelchair Mobility    Modified Rankin (Stroke Patients Only)        Balance Overall balance assessment: Needs assistance Sitting-balance support: No upper extremity supported;Feet supported Sitting balance-Leahy Scale: Good     Standing balance support: No upper extremity supported Standing balance-Leahy Scale: Fair                               Pertinent Vitals/Pain Pain Assessment: No/denies pain    Home Living Family/patient expects to be discharged to:: Assisted living               Home Equipment: None      Prior Function Level of Independence: Independent         Comments: Pt and ex-wife report pt is largely IND at ALF      Hand Dominance        Extremity/Trunk Assessment   Upper Extremity Assessment: RUE deficits/detail RUE Deficits / Details: hand wrist in splint - limited use 2* prior CVA         Lower Extremity Assessment: RLE deficits/detail RLE Deficits / Details: Motor apraxia as residual affects of prior CVA        Communication   Communication: Expressive difficulties  Cognition Arousal/Alertness: Awake/alert Behavior During Therapy: WFL for tasks assessed/performed Overall Cognitive Status: Within Functional Limits for tasks assessed                      General Comments      Exercises     Assessment/Plan    PT Assessment Patient needs continued PT services  PT Problem List Decreased activity  tolerance;Decreased balance;Decreased mobility;Decreased safety awareness;Impaired tone          PT Treatment Interventions Gait training;Functional mobility training;Therapeutic activities;Therapeutic exercise;Balance training;Patient/family education    PT Goals (Current goals can be found in the Care Plan section)  Acute Rehab PT Goals Patient Stated Goal: Regain IND PT Goal Formulation: With patient Time For Goal Achievement: 05/02/16 Potential to Achieve Goals: Good    Frequency Min 3X/week   Barriers to discharge        Co-evaluation               End of  Session Equipment Utilized During Treatment: Gait belt;Oxygen Activity Tolerance: Patient tolerated treatment well;Patient limited by fatigue Patient left: in chair;with call bell/phone within reach;with chair alarm set;with family/visitor present Nurse Communication: Mobility status         Time: [id number] PT Time Calculation (min) (ACUTE ONLY): 25 min   Charges:   PT Evaluation $PT Eval Moderate Complexity: 1 Procedure PT Treatments $Gait Training: 8-22 mins   PT G Codes:        Costantino Kohlbeck 04/18/2016, 5:42 PM

## 2016-04-18 NOTE — Progress Notes (Signed)
PROGRESS NOTE    Daniel Oconnell  ZOX:[id number] DOB: 1956/02/12 DOA: 04/16/2016 PCP: No primary care provider on file.    Brief Narrative:   Daniel Oconnell is a 60 y.o. 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  History was obtained from his daughter as well as from the patient in a kind of charades game - he is able to say yes and no (reasonably accurately) and can make a few other intelligible sounds and also uses gestures and facial expressions.  He reports feeling nauseated Saturday night, explosive diarrhea for several days.  Fell Saturday night.  Developed RLQ abdominal pain.  Worsened to the point Tuesday that he felt the need to call 911 and come to ER.  Urine culture negative, CT unremarkable.  O2 levels 83-84% before discharge, but since he has COPD they discharged him.  Last night, pain worse again. Got meds overnight but still hurting this AM.  He once again was sent back to the ER. Pain worse with moving, elevation of bed.  No emesis with prior stomach bug, uncertain if he has a gag reflex.  No cough.  Possibly had fever on Saturday, Sunday.       Assessment & Plan:   Principal Problem:   Acute respiratory failure with hypoxia (HCC) Active Problems:   HCAP (healthcare-associated pneumonia)   Abdominal pain, acute, right lower quadrant   Nonspecific abnormal electrocardiogram (ECG) (EKG)   H/O: CVA (cerebrovascular accident)   COPD (chronic obstructive pulmonary disease) (HCC)   CAD (coronary artery disease)   Essential hypertension   Right lower quadrant abdominal pain   #1 acute respiratory failure with hypoxia likely secondary to healthcare associated pneumonia Patient had presented with hypoxia with sats in the 80s. ABG showed a metabolic alkalosis with a rest area acidosis. Patient with history of COPD however PCO2 was normal  less likely to be date urology. Patient with a poor cough reflex secondary to history of CVA and aphasia. Chest x-ray was negative for any infiltrate. CT angiogram did show atelectasis versus infiltrate. Patient was placed empirically on IV antibiotics for probable healthcare associated pneumonia. Will discontinue IV vancomycin. Continue IV cefepime. Blood cultures pending. WBC trending down. Patient afebrile. Patient with clinical improvement. Follow.  #2 abdominal pain Questionable etiology. Patient with recent CT scan that showed probable cystitis. CT also showed possible adrenal nodularity. Patient currently asymptomatic. Urinalysis was nitrite negative leukocytes negative. Urine culture pending. Continue empiric IV antibiotics. Check MRI to f/u on adrenal abnormality. Supportive care.  #3 abnormal EKG/history of coronary artery disease status post CABG EKG with T-wave inversions in leads 1, aVL, V3-V6. Patient denies any chest pain. Cardiac enzymes negative 3. Check a 2-D echo. Continue aspirin, Lipitor, Plavix, hydralazine. Norvasc and labetalol discontinued secondary to borderline blood pressure. Follow. Once blood pressure improves will resume beta blocker and calcium channel blocker.  #4 hypertension Blood pressure is improving with hydration. Patient was both on lisinopril and losartan.  Norvasc and labetalol on hold secondary to borderline blood pressure. Continue hydralazine. Once blood pressure improves labetalol and Norvasc may be resumed.  #5 history of CVA Patient with dysarthric speech. Continue aspirin and Plavix for secondary stroke prevention. PT/OT/ST.   DVT prophylaxis: Lovenox and Code Status: DO NOT RESUSCITATE Family Communication: Updated patient and family at bedside. Disposition Plan: Home when medically improved and hypoxia has improved.   Consultants:   None  Procedures:   CT angiogram chest 04/16/2016  Chest x-ray 04/16/2016  Antimicrobials:   IV  cefepime 04/16/2016  IV vancomycin 04/16/2016>>>>> 04/18/2016   Subjective: Sleeping. Easily arousable. Dysarthric speech. Patient denies chest pain. Patient states some improvement with shortness of breath. Patient denies any abdominal pain.   Objective: Vitals:   04/17/16 1956 04/17/16 2059 04/18/16 0429 04/18/16 0802  BP: 135/81  (!) 144/84   Pulse: 67  (!) 52   Resp: 20  20   Temp: 98.8 F (37.1 C)  98.4 F (36.9 C)   TempSrc: Oral  Oral   SpO2: 91% 93% 92% 94%  Weight:      Height:        Intake/Output Summary (Last 24 hours) at 04/18/16 1230 Last data filed at 04/18/16 1059  Gross per 24 hour  Intake             2275 ml  Output              300 ml  Net             1975 ml   Filed Weights   04/16/16 2125  Weight: 89.5 kg (197 lb 5 oz)    Examination:  General exam: Appears calm and comfortable  Respiratory system: Some coarse bibasilar breath sounds otherwise clear. No wheezing. Respiratory effort normal. Cardiovascular system: S1 & S2 heard, RRR. No JVD, murmurs, rubs, gallops or clicks. No pedal edema. Gastrointestinal system: Abdomen is nondistended, soft and nontender. No organomegaly or masses felt. Normal bowel sounds heard. Central nervous system: Alert and oriented. No focal neurological deficits. Extremities: Symmetric 5 x 5 power. Skin: No rashes, lesions or ulcers Psychiatry: Judgement and insight appear normal. Mood & affect appropriate.     Data Reviewed: I have personally reviewed following labs and imaging studies  CBC:  Recent Labs Lab 04/14/16 2218 04/16/16 1317 04/17/16 0151 04/18/16 0604  WBC 14.6* 11.5* 10.8* 9.1  NEUTROABS 11.6* 9.3*  --   --   HGB 14.5 13.4 12.3* 12.7*  HCT 41.4 39.2 36.1* 36.7*  MCV 83.3 84.8 84.0 84.0  PLT 199 197 191 203   Basic Metabolic Panel:  Recent Labs Lab 04/14/16 2218 04/16/16 1317 04/17/16 0151 04/17/16 0843 04/18/16 0604  NA 138 139 138  --  140  K 3.4* 3.5 3.1*  --  3.6  CL 106 104  105  --  105  CO2 23 29 27   --  27  GLUCOSE 103* 110* 98  --  96  BUN 17 14 12   --  11  CREATININE 1.08 0.98 0.88  --  0.80  CALCIUM 8.5* 8.3* 7.8*  --  7.8*  MG  --   --   --  1.9  --    GFR: Estimated Creatinine Clearance: 107.8 mL/min (by C-G formula based on SCr of 0.8 mg/dL). Liver Function Tests:  Recent Labs Lab 04/14/16 2218 04/16/16 1317  AST 18 18  ALT 18 17  ALKPHOS 69 60  BILITOT 1.2 1.2  PROT 6.7 6.3*  ALBUMIN 3.8 3.5    Recent Labs Lab 04/14/16 2218 04/16/16 1317  LIPASE 18 22   No results for input(s): AMMONIA in the last 168 hours. Coagulation Profile:  Recent Labs Lab 04/16/16 2208  INR 1.20   Cardiac Enzymes:  Recent Labs Lab 04/17/16 0151 04/17/16 0843 04/17/16 1525  TROPONINI <0.03 <0.03 <0.03   BNP (last 3 results) No results for input(s): PROBNP in the last 8760 hours. HbA1C: No  results for input(s): HGBA1C in the last 72 hours. CBG: No results for input(s): GLUCAP in the last 168 hours. Lipid Profile: No results for input(s): CHOL, HDL, LDLCALC, TRIG, CHOLHDL, LDLDIRECT in the last 72 hours. Thyroid Function Tests: No results for input(s): TSH, T4TOTAL, FREET4, T3FREE, THYROIDAB in the last 72 hours. Anemia Panel: No results for input(s): VITAMINB12, FOLATE, FERRITIN, TIBC, IRON, RETICCTPCT in the last 72 hours. Sepsis Labs:  Recent Labs Lab 04/16/16 2023 04/16/16 2208 04/16/16 2213 04/17/16 0151  PROCALCITON  --  <0.10  --   --   LATICACIDVEN 0.63  --  1.1 0.7    Recent Results (from the past 240 hour(s))  Urine culture     Status: Abnormal   Collection Time: 04/15/16  2:13 AM  Result Value Ref Range Status   Specimen Description URINE, CLEAN CATCH  Final   Special Requests NONE  Final   Culture (A)  Final    <10,000 COLONIES/mL INSIGNIFICANT GROWTH Performed at [facility]    Report Status 04/16/2016 FINAL  Final  Urine culture     Status: None   Collection Time: 04/16/16  6:30 PM  Result Value Ref  Range Status   Specimen Description URINE, CLEAN CATCH  Final   Special Requests NONE  Final   Culture NO GROWTH Performed at [facility]   Final   Report Status 04/18/2016 FINAL  Final  MRSA PCR Screening     Status: None   Collection Time: 04/16/16 10:11 PM  Result Value Ref Range Status   MRSA by PCR NEGATIVE NEGATIVE Final    Comment:        The GeneXpert MRSA Assay (FDA approved for NASAL specimens only), is one component of a comprehensive MRSA colonization surveillance program. It is not intended to diagnose MRSA infection nor to guide or monitor treatment for MRSA infections.   Culture, blood (x 2)     Status: None (Preliminary result)   Collection Time: 04/16/16 10:13 PM  Result Value Ref Range Status   Specimen Description BLOOD LEFT HAND  Final   Special Requests BOTTLES DRAWN AEROBIC AND ANAEROBIC 6CC  Final   Culture   Final    NO GROWTH < 12 HOURS Performed at [facility]    Report Status PENDING  Incomplete  Culture, blood (x 2)     Status: None (Preliminary result)   Collection Time: 04/16/16 10:13 PM  Result Value Ref Range Status   Specimen Description BLOOD LEFT ARM  Final   Special Requests IN PEDIATRIC BOTTLE 2CC  Final   Culture   Final    NO GROWTH < 12 HOURS Performed at [facility]    Report Status PENDING  Incomplete         Radiology Studies: Dg Chest 2 View  Result Date: 04/16/2016 CLINICAL DATA:  Hypoxia EXAM: CHEST  2 VIEW COMPARISON:  None. FINDINGS: Borderline cardiomegaly. Status post CABG. There is mild interstitial prominence bilaterally without convincing pulmonary edema. Osteopenia and mild degenerative changes thoracic spine. No segmental infiltrate. IMPRESSION: Mild interstitial prominence bilateral without convincing pulmonary edema. No segmental infiltrate. Electronically Signed   By: Natasha Mead M.D.   On: 04/16/2016 14:37   Ct Angio Chest Pe W And/or Wo Contrast  Result Date: 04/16/2016 CLINICAL  DATA:  Right upper quadrant pain and hypoxia. EXAM: CT ANGIOGRAPHY CHEST WITH CONTRAST TECHNIQUE: Multidetector CT imaging of the chest was performed using the standard protocol during bolus administration of intravenous contrast. Multiplanar  CT image reconstructions and MIPs were obtained to evaluate the vascular anatomy. CONTRAST:  100 cc Isovue 370 COMPARISON:  None. FINDINGS: Cardiovascular: Heart is enlarged. Coronary artery calcification is noted. Patient is status post CABG Atherosclerotic calcification is noted in the wall of the thoracic aorta. No thoracic aortic dissection. 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 Pulmonary arterial opacification is suboptimal secondary to the central venous stenosis, but within this limitation there is no evidence for large central pulmonary embolus in the main pulmonary outflow tract, main pulmonary arteries, or lobar pulmonary arteries. There is no definite pulmonary embolus in segmental and subsegmental pulmonary arteries although assessment may not be reliable given bolus timing. Mediastinum/Nodes: No mediastinal lymphadenopathy. There is no hilar lymphadenopathy. Borderline lymphadenopathy is identified in the right hilum. The esophagus has normal imaging features. There is no axillary lymphadenopathy. Lungs/Pleura: Emphysema noted bilaterally with interlobular septal thickening and bronchial wall thickening. Dependent collapse/consolidation is identified in both lower lobes and small bilateral pleural effusions are evident. Upper Abdomen: Unremarkable. Musculoskeletal: Bone windows reveal no worrisome lytic or sclerotic osseous lesions. 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 Review of the MIP images confirms the above findings. IMPRESSION: 1. Suboptimal bolus timing due to central venous stenosis. Within this limitation,  there is no evidence for large central pulmonary embolus. While no embolic disease is seen in segmental or subsegmental pulmonary arteries, evaluation may not be reliable given the bolus timing. 2. Coronary artery and thoracic aortic atherosclerosis. 3. Dependent collapse/consolidation of both lower lobes with small bilateral pleural effusions. Electronically Signed   By: Kennith Center M.D.   On: 04/16/2016 18:11        Scheduled Meds: . aspirin  325 mg Oral Q breakfast  . atorvastatin  80 mg Oral QHS  . baclofen  20 mg Oral TID  . budesonide (PULMICORT) nebulizer solution  0.25 mg Nebulization BID  . ceFEPime (MAXIPIME) IV  1 g Intravenous Once  . ceFEPime (MAXIPIME) IV  1 g Intravenous Q8H  . clopidogrel  75 mg Oral Q breakfast  . enoxaparin (LOVENOX) injection  40 mg Subcutaneous Q24H  . hydrALAZINE  100 mg Oral BID  . ipratropium-albuterol  3 mL Nebulization TID  . lactobacillus acidophilus & bulgar  1 tablet Oral Q breakfast  . levETIRAcetam  750 mg Oral BID  . loratadine  10 mg Oral Daily  . nystatin  5 mL Oral QID  . sertraline  100 mg Oral Q breakfast  . vancomycin  1,000 mg Intravenous Q12H   Continuous Infusions:    LOS: 2 days    Time spent: 35 minutes    THOMPSON,DANIEL, MD [facility] Pager [phone]  If 7PM-7AM, please contact night-coverage [email or web address] Password [email or web address] 04/18/2016, 12:30 PM

## 2016-04-19 ENCOUNTER — Inpatient Hospital Stay (HOSPITAL_COMMUNITY): Payer: Medicare HMO

## 2016-04-19 LAB — CBC
HCT: 40.4 % (ref 39.0–52.0)
HEMOGLOBIN: 13.6 g/dL (ref 13.0–17.0)
MCH: 28.6 pg (ref 26.0–34.0)
MCHC: 33.7 g/dL (ref 30.0–36.0)
MCV: 84.9 fL (ref 78.0–100.0)
PLATELETS: 264 10*3/uL (ref 150–400)
RBC: 4.76 MIL/uL (ref 4.22–5.81)
RDW: 13.8 % (ref 11.5–15.5)
WBC: 9.9 10*3/uL (ref 4.0–10.5)

## 2016-04-19 LAB — BASIC METABOLIC PANEL
Anion gap: 8 (ref 5–15)
BUN: 7 mg/dL (ref 6–20)
CALCIUM: 8.6 mg/dL — AB (ref 8.9–10.3)
CHLORIDE: 100 mmol/L — AB (ref 101–111)
CO2: 31 mmol/L (ref 22–32)
CREATININE: 0.77 mg/dL (ref 0.61–1.24)
Glucose, Bld: 104 mg/dL — ABNORMAL HIGH (ref 65–99)
Potassium: 3.5 mmol/L (ref 3.5–5.1)
SODIUM: 139 mmol/L (ref 135–145)

## 2016-04-19 MED ORDER — POLYETHYLENE GLYCOL 3350 17 G PO PACK
17.0000 g | PACK | Freq: Every day | ORAL | Status: DC
  Administered 2016-04-19 – 2016-04-22 (×4): 17 g via ORAL
  Filled 2016-04-19 (×5): qty 1

## 2016-04-19 MED ORDER — LEVETIRACETAM 250 MG PO TABS
750.0000 mg | ORAL_TABLET | Freq: Two times a day (BID) | ORAL | Status: DC
  Administered 2016-04-19 – 2016-04-22 (×7): 750 mg via ORAL
  Filled 2016-04-19 (×7): qty 3

## 2016-04-19 MED ORDER — TAMSULOSIN HCL 0.4 MG PO CAPS
0.4000 mg | ORAL_CAPSULE | Freq: Every day | ORAL | Status: DC
  Administered 2016-04-19 – 2016-04-22 (×4): 0.4 mg via ORAL
  Filled 2016-04-19 (×4): qty 1

## 2016-04-19 MED ORDER — POTASSIUM CHLORIDE CRYS ER 20 MEQ PO TBCR
40.0000 meq | EXTENDED_RELEASE_TABLET | Freq: Once | ORAL | Status: AC
  Administered 2016-04-19: 40 meq via ORAL
  Filled 2016-04-19: qty 2

## 2016-04-19 MED ORDER — SENNOSIDES-DOCUSATE SODIUM 8.6-50 MG PO TABS
1.0000 | ORAL_TABLET | Freq: Every day | ORAL | Status: DC
  Administered 2016-04-19 – 2016-04-21 (×3): 1 via ORAL
  Filled 2016-04-19 (×3): qty 1

## 2016-04-19 MED ORDER — LABETALOL HCL 200 MG PO TABS
200.0000 mg | ORAL_TABLET | Freq: Two times a day (BID) | ORAL | Status: DC
  Administered 2016-04-19 – 2016-04-22 (×7): 200 mg via ORAL
  Filled 2016-04-19 (×7): qty 1

## 2016-04-19 NOTE — Progress Notes (Signed)
Pharmacy Antibiotic Note  Daniel Oconnell is a 60 y.o. male admitted on 04/16/2016 with abdominal pain.  Pharmacy has been consulted for cefepime dosing for HCAP as CT shows dependent collapse/consolidation of both lower lobes with small bilateral pleural effusions..  Today, 04/19/2016: Day #3 antibiotics  Vancomycin stopped 12/2 for neg MRSA PCR swab and unrevealing cultures  WBC WNL  PCT <0.1 11/30  Afebrile  O2 demands decreasing  Plan:  Cefepime 1g IV q8h remains appropriate dose  PCT <0.1 consistent with non-bacterial cause of pneumonia.  Consider repeating PCT and stopping antibiotics as clinically appropriate, otherwise...  Consider change to PO ceftin 500mg  BID or cefpodoxime 200mg  BID x 4 days to complete course  Follow up renal function & cultures Height: 5\' 10"  (177.8 cm) Weight: 197 lb 5 oz (89.5 kg) IBW/kg (Calculated) : 73  Temp (24hrs), Avg:97.9 F (36.6 C), Min:97.5 F (36.4 C), Max:98.4 F (36.9 C)   Recent Labs Lab 04/14/16 2218 04/16/16 1317 04/16/16 2023 04/16/16 2213 04/17/16 0151 04/18/16 0604 04/19/16 0547  WBC 14.6* 11.5*  --   --  10.8* 9.1 9.9  CREATININE 1.08 0.98  --   --  0.88 0.80 0.77  LATICACIDVEN  --   --  0.63 1.1 0.7  --   --     Estimated Creatinine Clearance: 107.8 mL/min (by C-G formula based on SCr of 0.77 mg/dL).    Allergies  Allergen Reactions  . Sulfa Antibiotics Other (See Comments)    Antimicrobials this admission:  11/30 Cefepime >> 11/30 Vanc >> 12/2  Microbiology results:  11/29 UCx: <10k insignificant 11/30 UCx: NG 11/30 BCx: NGTD 11/30 MRSA PCR neg  Thank you for allowing pharmacy to be a part of this patient's care.  Juliette Alcideustin Zeigler, PharmD, BCPS.   Pager: [phone] 04/19/2016 12:24 PM

## 2016-04-19 NOTE — Progress Notes (Signed)
PROGRESS NOTE    Daniel Oconnell  EAV:[id number] DOB: [birth year] DOA: 04/16/2016 PCP: No primary care provider on file.    Brief Narrative:   Daniel Oconnell is a 60 y.o. 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  History was obtained from his daughter as well as from the patient in a kind of charades game - he is able to say yes and no (reasonably accurately) and can make a few other intelligible sounds and also uses gestures and facial expressions.  He reports feeling nauseated Saturday night, explosive diarrhea for several days.  Fell Saturday night.  Developed RLQ abdominal pain.  Worsened to the point Tuesday that he felt the need to call 911 and come to ER.  Urine culture negative, CT unremarkable.  O2 levels 83-84% before discharge, but since he has COPD they discharged him.  Last night, pain worse again. Got meds overnight but still hurting this AM.  He once again was sent back to the ER. Pain worse with moving, elevation of bed.  No emesis with prior stomach bug, uncertain if he has a gag reflex.  No cough.  Possibly had fever on Saturday, Sunday.       Assessment & Plan:   Principal Problem:   Acute respiratory failure with hypoxia (HCC) Active Problems:   HCAP (healthcare-associated pneumonia)   Abdominal pain, acute, right lower quadrant   Nonspecific abnormal electrocardiogram (ECG) (EKG)   H/O: CVA (cerebrovascular accident)   COPD (chronic obstructive pulmonary disease) (HCC)   CAD (coronary artery disease)   Essential hypertension   Right lower quadrant abdominal pain   Adrenal abnormality (HCC)   Expressive aphasia   #1 acute respiratory failure with hypoxia likely secondary to healthcare associated pneumonia Patient had presented with hypoxia with sats in the 80s. ABG showed a metabolic alkalosis with a rest area acidosis.  Patient with history of COPD however PCO2 was normal less likely to be date urology. Patient with a poor cough reflex secondary to history of CVA and aphasia. Chest x-ray was negative for any infiltrate. CT angiogram did show atelectasis versus infiltrate. Patient was placed empirically on IV antibiotics for probable healthcare associated pneumonia. Discontinued IV vancomycin. Continue IV cefepime. Blood cultures pending. WBC trending down. Patient afebrile. Patient with clinical improvement. Follow.  #2 abdominal pain Questionable etiology. Patient with recent CT scan that showed probable cystitis. CT also showed possible adrenal nodularity. Patient currently asymptomatic. Urinalysis was nitrite negative leukocytes negative. Urine culture pending. Continue empiric IV antibiotics. Pending MRI to f/u on adrenal abnormality. Supportive care.  #3 abnormal EKG/history of coronary artery disease status post CABG EKG with T-wave inversions in leads 1, aVL, V3-V6. Patient denies any chest pain. Cardiac enzymes negative 3. 2-D echo pending. Continue aspirin, Lipitor, Plavix, hydralazine. Continue to hold Norvasc. Resume labetalol.   #4 hypertension Blood pressure is improving with hydration. Patient was on both on lisinopril and losartan.  Norvasc and labetalol on hold secondary to borderline blood pressure. Continue hydralazine. Will resume labetalol.   #5 history of CVA Patient with dysarthric speech. Continue aspirin and Plavix for secondary stroke prevention. PT/OT/ST.  #6 urinary frequency Postvoid residual with 215 mL. Place on Flomax. Sorry lock IV fluids. Follow. Will likely need outpatient follow-up with urology as per CT scan patient with an enlarged prostate.   DVT prophylaxis: Lovenox and Code Status: DO NOT RESUSCITATE Family Communication: Updated patient and  family at bedside. Disposition Plan: Home when medically improved and hypoxia has improved.   Consultants:    None  Procedures:   CT angiogram chest 04/16/2016  Chest x-ray 04/16/2016  Antimicrobials:   IV cefepime 04/16/2016  IV vancomycin 04/16/2016>>>>> 04/18/2016   Subjective: Sleeping. Easily arousable. Dysarthric speech. Patient denies chest pain. Patient states some improvement with shortness of breath. Patient denies any abdominal pain. Patient complaining of urinary frequency.  Objective: Vitals:   04/18/16 2025 04/18/16 2118 04/19/16 0507 04/19/16 0747  BP: (!) 155/73  (!) 166/85   Pulse: 63  61   Resp: 18  18   Temp: 97.5 F (36.4 C)  97.8 F (36.6 C)   TempSrc: Oral  Oral   SpO2: 96% 94% 96% 96%  Weight:      Height:        Intake/Output Summary (Last 24 hours) at 04/19/16 1123 Last data filed at 04/19/16 0506  Gross per 24 hour  Intake              100 ml  Output              800 ml  Net             -700 ml   Filed Weights   04/16/16 2125  Weight: 89.5 kg (197 lb 5 oz)    Examination:  General exam: Appears calm and comfortable  Respiratory system: Some coarse bibasilar breath sounds otherwise clear. No wheezing. Respiratory effort normal. Cardiovascular system: S1 & S2 heard, RRR. No JVD, murmurs, rubs, gallops or clicks. No pedal edema. Gastrointestinal system: Abdomen is nondistended, soft and nontender. No organomegaly or masses felt. Normal bowel sounds heard. Central nervous system: Alert and oriented. No focal neurological deficits. Extremities: Symmetric 5 x 5 power. Skin: No rashes, lesions or ulcers Psychiatry: Judgement and insight appear normal. Mood & affect appropriate.     Data Reviewed: I have personally reviewed following labs and imaging studies  CBC:  Recent Labs Lab 04/14/16 2218 04/16/16 1317 04/17/16 0151 04/18/16 0604 04/19/16 0547  WBC 14.6* 11.5* 10.8* 9.1 9.9  NEUTROABS 11.6* 9.3*  --   --   --   HGB 14.5 13.4 12.3* 12.7* 13.6  HCT 41.4 39.2 36.1* 36.7* 40.4  MCV 83.3 84.8 84.0 84.0 84.9  PLT 199 197 191 203  264   Basic Metabolic Panel:  Recent Labs Lab 04/14/16 2218 04/16/16 1317 04/17/16 0151 04/17/16 0843 04/18/16 0604 04/19/16 0547  NA 138 139 138  --  140 139  K 3.4* 3.5 3.1*  --  3.6 3.5  CL 106 104 105  --  105 100*  CO2 23 29 27   --  27 31  GLUCOSE 103* 110* 98  --  96 104*  BUN 17 14 12   --  11 7  CREATININE 1.08 0.98 0.88  --  0.80 0.77  CALCIUM 8.5* 8.3* 7.8*  --  7.8* 8.6*  MG  --   --   --  1.9  --   --    GFR: Estimated Creatinine Clearance: 107.8 mL/min (by C-G formula based on SCr of 0.77 mg/dL). Liver Function Tests:  Recent Labs Lab 04/14/16 2218 04/16/16 1317  AST 18 18  ALT 18 17  ALKPHOS 69 60  BILITOT 1.2 1.2  PROT 6.7 6.3*  ALBUMIN 3.8 3.5    Recent Labs Lab 04/14/16 2218 04/16/16 1317  LIPASE 18 22   No results for input(s): AMMONIA in the last 168 hours.  Coagulation Profile:  Recent Labs Lab 04/16/16 2208  INR 1.20   Cardiac Enzymes:  Recent Labs Lab 04/17/16 0151 04/17/16 0843 04/17/16 1525  TROPONINI <0.03 <0.03 <0.03   BNP (last 3 results) No results for input(s): PROBNP in the last 8760 hours. HbA1C: No results for input(s): HGBA1C in the last 72 hours. CBG: No results for input(s): GLUCAP in the last 168 hours. Lipid Profile: No results for input(s): CHOL, HDL, LDLCALC, TRIG, CHOLHDL, LDLDIRECT in the last 72 hours. Thyroid Function Tests: No results for input(s): TSH, T4TOTAL, FREET4, T3FREE, THYROIDAB in the last 72 hours. Anemia Panel: No results for input(s): VITAMINB12, FOLATE, FERRITIN, TIBC, IRON, RETICCTPCT in the last 72 hours. Sepsis Labs:  Recent Labs Lab 04/16/16 2023 04/16/16 2208 04/16/16 2213 04/17/16 0151  PROCALCITON  --  <0.10  --   --   LATICACIDVEN 0.63  --  1.1 0.7    Recent Results (from the past 240 hour(s))  Urine culture     Status: Abnormal   Collection Time: 04/15/16  2:13 AM  Result Value Ref Range Status   Specimen Description URINE, CLEAN CATCH  Final   Special Requests  NONE  Final   Culture (A)  Final    <10,000 COLONIES/mL INSIGNIFICANT GROWTH Performed at [facility]    Report Status 04/16/2016 FINAL  Final  Urine culture     Status: None   Collection Time: 04/16/16  6:30 PM  Result Value Ref Range Status   Specimen Description URINE, CLEAN CATCH  Final   Special Requests NONE  Final   Culture NO GROWTH Performed at [facility]   Final   Report Status 04/18/2016 FINAL  Final  MRSA PCR Screening     Status: None   Collection Time: 04/16/16 10:11 PM  Result Value Ref Range Status   MRSA by PCR NEGATIVE NEGATIVE Final    Comment:        The GeneXpert MRSA Assay (FDA approved for NASAL specimens only), is one component of a comprehensive MRSA colonization surveillance program. It is not intended to diagnose MRSA infection nor to guide or monitor treatment for MRSA infections.   Culture, blood (x 2)     Status: None (Preliminary result)   Collection Time: 04/16/16 10:13 PM  Result Value Ref Range Status   Specimen Description BLOOD LEFT HAND  Final   Special Requests BOTTLES DRAWN AEROBIC AND ANAEROBIC 6CC  Final   Culture   Final    NO GROWTH 1 DAY Performed at [facility]    Report Status PENDING  Incomplete  Culture, blood (x 2)     Status: None (Preliminary result)   Collection Time: 04/16/16 10:13 PM  Result Value Ref Range Status   Specimen Description BLOOD LEFT ARM  Final   Special Requests IN PEDIATRIC BOTTLE 2CC  Final   Culture   Final    NO GROWTH 1 DAY Performed at [facility]    Report Status PENDING  Incomplete         Radiology Studies: No results found.      Scheduled Meds: . aspirin  325 mg Oral Q breakfast  . atorvastatin  80 mg Oral QHS  . baclofen  20 mg Oral TID  . budesonide (PULMICORT) nebulizer solution  0.25 mg Nebulization BID  . ceFEPime (MAXIPIME) IV  1 g Intravenous Once  . ceFEPime (MAXIPIME) IV  1 g Intravenous Q8H  . clopidogrel  75 mg Oral Q  breakfast  .  enoxaparin (LOVENOX) injection  40 mg Subcutaneous Q24H  . hydrALAZINE  100 mg Oral BID  . ipratropium-albuterol  3 mL Nebulization TID  . lactobacillus acidophilus & bulgar  1 tablet Oral Q breakfast  . levETIRAcetam  750 mg Oral BID  . loratadine  10 mg Oral Daily  . nystatin  5 mL Oral QID  . potassium chloride SA  40 mEq Oral Once  . senna-docusate  1 tablet Oral QHS  . sertraline  100 mg Oral Q breakfast   Continuous Infusions:    LOS: 3 days    Time spent: 35 minutes    THOMPSON,DANIEL, MD [facility] Pager [phone]  If 7PM-7AM, please contact night-coverage [email or web address] Password [email or web address] 04/19/2016, 11:23 AM

## 2016-04-20 ENCOUNTER — Inpatient Hospital Stay (HOSPITAL_COMMUNITY): Payer: Medicare HMO

## 2016-04-20 LAB — CBC
HCT: 39 % (ref 39.0–52.0)
Hemoglobin: 13.4 g/dL (ref 13.0–17.0)
MCH: 29.1 pg (ref 26.0–34.0)
MCHC: 34.4 g/dL (ref 30.0–36.0)
MCV: 84.6 fL (ref 78.0–100.0)
PLATELETS: 262 10*3/uL (ref 150–400)
RBC: 4.61 MIL/uL (ref 4.22–5.81)
RDW: 13.9 % (ref 11.5–15.5)
WBC: 10.3 10*3/uL (ref 4.0–10.5)

## 2016-04-20 LAB — BASIC METABOLIC PANEL
Anion gap: 6 (ref 5–15)
BUN: 13 mg/dL (ref 6–20)
CALCIUM: 8.6 mg/dL — AB (ref 8.9–10.3)
CO2: 30 mmol/L (ref 22–32)
CREATININE: 0.76 mg/dL (ref 0.61–1.24)
Chloride: 99 mmol/L — ABNORMAL LOW (ref 101–111)
GFR calc Af Amer: >60 mL/min (ref >60)
GLUCOSE: 103 mg/dL — AB (ref 65–99)
Potassium: 3.9 mmol/L (ref 3.5–5.1)
Sodium: 135 mmol/L (ref 135–145)

## 2016-04-20 MED ORDER — GADOBENATE DIMEGLUMINE 529 MG/ML IV SOLN
20.0000 mL | Freq: Once | INTRAVENOUS | Status: AC | PRN
  Administered 2016-04-20: 20 mL via INTRAVENOUS

## 2016-04-20 MED ORDER — LEVOFLOXACIN 750 MG PO TABS
750.0000 mg | ORAL_TABLET | Freq: Every day | ORAL | Status: DC
  Administered 2016-04-20 – 2016-04-22 (×3): 750 mg via ORAL
  Filled 2016-04-20 (×3): qty 1

## 2016-04-20 MED ORDER — AMLODIPINE BESYLATE 10 MG PO TABS
10.0000 mg | ORAL_TABLET | Freq: Every day | ORAL | Status: DC
  Administered 2016-04-20 – 2016-04-21 (×2): 10 mg via ORAL
  Filled 2016-04-20 (×2): qty 1

## 2016-04-20 NOTE — Progress Notes (Signed)
Pt son asked if PT can come see patient now. PT paged.

## 2016-04-20 NOTE — Evaluation (Signed)
SLP Cancellation Note  Patient Details Name: Hedy JacobKirk Molinaro MRN: [id number] DOB: [birth year]   Cancelled treatment:       Reason Eval/Treat Not Completed: SLP screened, no needs identified, will sign off (son and pt report communication abilities are the same as prior to admit, no slp eval indication- son and pt agree, thanks. )   Donavan Burnetamara Vassie Kugel, MS [facility] S[facility] [phone]

## 2016-04-20 NOTE — Care Management Important Message (Signed)
Important Message  Patient Details  Name: Hedy JacobKirk Dietzman MRN: [id number] Date of Birth: May 11, 1956   Medicare Important Message Given:  Yes    Haskell FlirtJamison, Krystal Delduca 04/20/2016, 10:22 AMImportant Message  Patient Details  Name: Hedy JacobKirk Ketcham MRN: [id number] Date of Birth: May 11, 1956   Medicare Important Message Given:  Yes    Haskell FlirtJamison, Sabrina Keough 04/20/2016, 10:22 AM

## 2016-04-20 NOTE — Progress Notes (Signed)
PROGRESS NOTE    Daniel Oconnell  WUJ:[id number] DOB: 11/28/1955 DOA: 04/16/2016 PCP: No primary care provider on file.    Brief Narrative:   Daniel Oconnell is a 60 y.o. 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  History was obtained from his daughter as well as from the patient in a kind of charades game - he is able to say yes and no (reasonably accurately) and can make a few other intelligible sounds and also uses gestures and facial expressions.  He reports feeling nauseated Saturday night, explosive diarrhea for several days.  Fell Saturday night.  Developed RLQ abdominal pain.  Worsened to the point Tuesday that he felt the need to call 911 and come to ER.  Urine culture negative, CT unremarkable.  O2 levels 83-84% before discharge, but since he has COPD they discharged him.  Last night, pain worse again. Got meds overnight but still hurting this AM.  He once again was sent back to the ER. Pain worse with moving, elevation of bed.  No emesis with prior stomach bug, uncertain if he has a gag reflex.  No cough.  Possibly had fever on Saturday, Sunday.       Assessment & Plan:   Principal Problem:   Acute respiratory failure with hypoxia (HCC) Active Problems:   HCAP (healthcare-associated pneumonia)   Abdominal pain, acute, right lower quadrant   Nonspecific abnormal electrocardiogram (ECG) (EKG)   H/O: CVA (cerebrovascular accident)   COPD (chronic obstructive pulmonary disease) (HCC)   CAD (coronary artery disease)   Essential hypertension   Right lower quadrant abdominal pain   Adrenal abnormality (HCC)   Expressive aphasia   #1 acute respiratory failure with hypoxia likely secondary to healthcare associated pneumonia Patient had presented with hypoxia with sats in the 80s. ABG showed a metabolic alkalosis with a respiratory acidosis.  Patient with history of COPD however PCO2 was normal less likely to be date urology. Patient with a poor cough reflex secondary to history of CVA and aphasia. Chest x-ray was negative for any infiltrate. CT angiogram did show atelectasis versus infiltrate. Blood cultures with no growth to date. Patient was placed empirically on IV antibiotics for probable healthcare associated pneumonia. Discontinued IV vancomycin. D/C IV cefepime. WBC trending down. Patient afebrile. Patient with clinical improvement. Transition to oral Levaquin. Follow.  #2 abdominal pain Questionable etiology. Patient with recent CT scan that showed probable cystitis. CT also showed possible adrenal nodularity. Patient currently asymptomatic. Urinalysis was nitrite negative leukocytes negative. Urine culture pending. Continue empiric IV antibiotics. MRI negative for adrenal abnormality. Supportive care.  #3 abnormal EKG/history of coronary artery disease status post CABG EKG with T-wave inversions in leads 1, aVL, V3-V6. Patient denies any chest pain. Cardiac enzymes negative 3. 2-D echo pending. Continue aspirin, Lipitor, Plavix, hydralazine. Continue to hold Norvasc. Resume labetalol.   #4 hypertension Blood pressure is improved with hydration. Patient was on both on losartan.  Labetalol has been resumed. Will resume Norvasc.   #5 history of CVA Patient with dysarthric speech. Continue aspirin and Plavix for secondary stroke prevention. PT/OT/ST.  #6 urinary frequency Postvoid residual with 215 mL. Continue Flomax. Saline lock IV fluids. Follow. Will likely need outpatient follow-up with urology as per CT scan patient with an enlarged prostate.   DVT prophylaxis: Lovenox  Code Status: DO NOT RESUSCITATE Family Communication: Updated patient and family at bedside. Disposition Plan: Home  when medically improved and hypoxia has improved, hopefully tomorrow.   Consultants:   None  Procedures:   CT angiogram chest  04/16/2016  Chest x-ray 04/16/2016  MRI abdomen 04/20/2016  Antimicrobials:   IV cefepime 04/16/2016 >>>>>> 04/20/2016  IV vancomycin 04/16/2016>>>>> 04/18/2016  Oral Levaquin 04/20/2016   Subjective: Sleeping. Easily arousable. Dysarthric speech. Patient denies chest pain. Patient states some improvement with shortness of breath. Patient denies any abdominal pain.   Objective: Vitals:   04/19/16 1435 04/19/16 2103 04/19/16 2155 04/20/16 0504  BP:  (!) 174/94  (!) 151/83  Pulse:  63  66  Resp:  18  19  Temp:  98.4 F (36.9 C)  98.4 F (36.9 C)  TempSrc:  Oral  Oral  SpO2: 95% 92% 93% 95%  Weight:      Height:        Intake/Output Summary (Last 24 hours) at 04/20/16 1156 Last data filed at 04/20/16 1610  Gross per 24 hour  Intake              100 ml  Output              550 ml  Net             -450 ml   Filed Weights   04/16/16 2125  Weight: 89.5 kg (197 lb 5 oz)    Examination:  General exam: Appears calm and comfortable  Respiratory system: Some scattered coarse bibasilar breath sounds otherwise clear. No wheezing. Respiratory effort normal. Cardiovascular system: S1 & S2 heard, RRR. No JVD, murmurs, rubs, gallops or clicks. No pedal edema. Gastrointestinal system: Abdomen is nondistended, soft and nontender. No organomegaly or masses felt. Normal bowel sounds heard. Central nervous system: Alert and oriented. No focal neurological deficits. Extremities: Symmetric 5 x 5 power. Skin: No rashes, lesions or ulcers Psychiatry: Judgement and insight appear normal. Mood & affect appropriate.     Data Reviewed: I have personally reviewed following labs and imaging studies  CBC:  Recent Labs Lab 04/14/16 2218 04/16/16 1317 04/17/16 0151 04/18/16 0604 04/19/16 0547 04/20/16 0946  WBC 14.6* 11.5* 10.8* 9.1 9.9 10.3  NEUTROABS 11.6* 9.3*  --   --   --   --   HGB 14.5 13.4 12.3* 12.7* 13.6 13.4  HCT 41.4 39.2 36.1* 36.7* 40.4 39.0  MCV 83.3 84.8 84.0  84.0 84.9 84.6  PLT 199 197 191 203 264 262   Basic Metabolic Panel:  Recent Labs Lab 04/16/16 1317 04/17/16 0151 04/17/16 0843 04/18/16 0604 04/19/16 0547 04/20/16 0946  NA 139 138  --  140 139 135  K 3.5 3.1*  --  3.6 3.5 3.9  CL 104 105  --  105 100* 99*  CO2 29 27  --  27 31 30   GLUCOSE 110* 98  --  96 104* 103*  BUN 14 12  --  11 7 13   CREATININE 0.98 0.88  --  0.80 0.77 0.76  CALCIUM 8.3* 7.8*  --  7.8* 8.6* 8.6*  MG  --   --  1.9  --   --   --    GFR: Estimated Creatinine Clearance: 107.8 mL/min (by C-G formula based on SCr of 0.76 mg/dL). Liver Function Tests:  Recent Labs Lab 04/14/16 2218 04/16/16 1317  AST 18 18  ALT 18 17  ALKPHOS 69 60  BILITOT 1.2 1.2  PROT 6.7 6.3*  ALBUMIN 3.8 3.5    Recent Labs Lab 04/14/16 2218 04/16/16 1317  LIPASE 18  22   No results for input(s): AMMONIA in the last 168 hours. Coagulation Profile:  Recent Labs Lab 04/16/16 2208  INR 1.20   Cardiac Enzymes:  Recent Labs Lab 04/17/16 0151 04/17/16 0843 04/17/16 1525  TROPONINI <0.03 <0.03 <0.03   BNP (last 3 results) No results for input(s): PROBNP in the last 8760 hours. HbA1C: No results for input(s): HGBA1C in the last 72 hours. CBG: No results for input(s): GLUCAP in the last 168 hours. Lipid Profile: No results for input(s): CHOL, HDL, LDLCALC, TRIG, CHOLHDL, LDLDIRECT in the last 72 hours. Thyroid Function Tests: No results for input(s): TSH, T4TOTAL, FREET4, T3FREE, THYROIDAB in the last 72 hours. Anemia Panel: No results for input(s): VITAMINB12, FOLATE, FERRITIN, TIBC, IRON, RETICCTPCT in the last 72 hours. Sepsis Labs:  Recent Labs Lab 04/16/16 2023 04/16/16 2208 04/16/16 2213 04/17/16 0151  PROCALCITON  --  <0.10  --   --   LATICACIDVEN 0.63  --  1.1 0.7    Recent Results (from the past 240 hour(s))  Urine culture     Status: Abnormal   Collection Time: 04/15/16  2:13 AM  Result Value Ref Range Status   Specimen Description URINE,  CLEAN CATCH  Final   Special Requests NONE  Final   Culture (A)  Final    <10,000 COLONIES/mL INSIGNIFICANT GROWTH [facility]    Report Status 04/16/2016 FINAL  Final  Urine culture     Status: None   Collection Time: 04/16/16  6:30 PM  Result Value Ref Range Status   Specimen Description URINE, CLEAN CATCH  Final   Special Requests NONE  Final   Culture NO GROWTH [facility]   Final   Report Status 04/18/2016 FINAL  Final  MRSA PCR Screening     Status: None   Collection Time: 04/16/16 10:11 PM  Result Value Ref Range Status   MRSA by PCR NEGATIVE NEGATIVE Final    Comment:        The GeneXpert MRSA Assay (FDA approved for NASAL specimens only), is one component of a comprehensive MRSA colonization surveillance program. It is not intended to diagnose MRSA infection nor to guide or monitor treatment for MRSA infections.   Culture, blood (x 2)     Status: None (Preliminary result)   Collection Time: 04/16/16 10:13 PM  Result Value Ref Range Status   Specimen Description BLOOD LEFT HAND  Final   Special Requests BOTTLES DRAWN AEROBIC AND ANAEROBIC 6CC  Final   Culture   Final    NO GROWTH 2 DAYS [facility]    Report Status PENDING  Incomplete  Culture, blood (x 2)     Status: None (Preliminary result)   Collection Time: 04/16/16 10:13 PM  Result Value Ref Range Status   Specimen Description BLOOD LEFT ARM  Final   Special Requests IN PEDIATRIC BOTTLE 2CC  Final   Culture   Final    NO GROWTH 2 DAYS [facility]formed at [facility]    Report Status PENDING  Incomplete         Radiology Studies: Mr Abdomen W Wo Contrast  Result Date: 04/20/2016 CLINICAL DATA:  Adrenal nodularity on CT EXAM: MRI ABDOMEN WITHOUT AND WITH CONTRAST TECHNIQUE: Multiplanar multisequence MR imaging of the abdomen was performed both before and after the administration of intravenous contrast. CONTRAST:  20mL MULTIHANCE  GADOBENATE DIMEGLUMINE 529 MG/ML IV SOLN COMPARISON:  CT abdomen/ pelvis dated 04/06/2016 FINDINGS: Motion degraded  images. Lower chest: Trace left pleural effusion. Hepatobiliary: Mild hepatic steatosis. No focal hepatic lesion is seen. Gallbladder is within normal limits. No intrahepatic or extrahepatic ductal dilatation. Pancreas:  Within normal limits. Spleen:  Within normal limits. Adrenals/Urinary Tract: Mild thickening of the right adrenal gland inferiorly (series 8/image 22). Left adrenal gland is within normal limits, noting a prominent adjacent vessel. No discrete adrenal nodule/mass is seen. Kidneys are within normal limits.  No hydronephrosis. Stomach/Bowel: Stomach is within normal limits. Visualized bowel is unremarkable. Vascular/Lymphatic:  No evidence of abdominal aortic aneurysm. No suspicious abdominal lymphadenopathy. Other:  No abdominal ascites. Musculoskeletal: Degenerative changes of the lumbar spine. IMPRESSION: Motion degraded images. No discrete adrenal nodule/mass is seen on MRI. Mild hepatic steatosis. Trace left pleural effusion. Electronically Signed   By: Charline BillsSriyesh  Krishnan M.D.   On: 04/20/2016 09:19        Scheduled Meds: . amLODipine  10 mg Oral QHS  . aspirin  325 mg Oral Q breakfast  . atorvastatin  80 mg Oral QHS  . baclofen  20 mg Oral TID  . budesonide (PULMICORT) nebulizer solution  0.25 mg Nebulization BID  . ceFEPime (MAXIPIME) IV  1 g Intravenous Once  . ceFEPime (MAXIPIME) IV  1 g Intravenous Q8H  . clopidogrel  75 mg Oral Q breakfast  . enoxaparin (LOVENOX) injection  40 mg Subcutaneous Q24H  . hydrALAZINE  100 mg Oral BID  . ipratropium-albuterol  3 mL Nebulization TID  . labetalol  200 mg Oral BID  . lactobacillus acidophilus & bulgar  1 tablet Oral Q breakfast  . levETIRAcetam  750 mg Oral BID  . loratadine  10 mg Oral Daily  . nystatin  5 mL Oral QID  . polyethylene glycol  17 g Oral Daily  . senna-docusate  1 tablet Oral QHS  . sertraline   100 mg Oral Q breakfast  . tamsulosin  0.4 mg Oral Daily   Continuous Infusions:    LOS: 4 days    Time spent: 35 minutes    Monasia Lair, MD [facility] Pager [phone]  If 7PM-7AM, please contact night-coverage [email or web address] Password [email or web address] 04/20/2016, 11:56 AM

## 2016-04-20 NOTE — Evaluation (Signed)
Occupational Therapy Evaluation Patient Details Name: Daniel JacobKirk Magistro MRN: [id number] DOB: [birth year] Today's Date: 04/20/2016    History of Present Illness Pt admitted with acute abdominal pain, HCAP and resp failure.  Pt with hx of COPD, CAD, CABG and CVA with residual effects including homonymous hemianopsia, apraxia, expressive aphasia, decreased R side sensation, limited use of R hand and apraxia of R side   Clinical Impression   Pt admitted with ARF. Pt currently with functional limitations due to the deficits listed below (see OT Problem List).  Pt will benefit from skilled OT to increase their safety and independence with ADL and functional mobility for ADL to facilitate discharge to venue listed below.      Follow Up Recommendations  Home health OT    Equipment Recommendations  None recommended by OT       Precautions / Restrictions Precautions Precautions: Fall Restrictions Weight Bearing Restrictions: No      Mobility Bed Mobility Overal bed mobility: Modified Independent             General bed mobility comments: Pt to EOB unassisted  Transfers Overall transfer level: Needs assistance Equipment used: None Transfers: Sit to/from Stand;Stand Pivot Transfers Sit to Stand: Min guard;Min assist              Balance     Sitting balance-Leahy Scale: Good       Standing balance-Leahy Scale: Fair                              ADL Overall ADL's : Needs assistance/impaired Eating/Feeding: Sitting;Minimal assistance   Grooming: Minimal assistance;Sitting                               Functional mobility during ADLs: Cueing for safety;Cueing for sequencing;Minimal assistance General ADL Comments: Pt overall min A with ADL activity. Pts son reports he was mostly I at ALF.   Dinner came during OT eval- will further assess ADL's next day               Pertinent Vitals/Pain Pain Assessment: No/denies pain         Extremity/Trunk Assessment Upper Extremity Assessment RUE Deficits / Details: hand wrist in splint - limited use 2* prior CVA. splint removed and skin checked.  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  RLE Sensation: decreased light touch;decreased proprioception RLE Coordination: decreased fine motor       Communication Communication Communication: Expressive difficulties   Cognition Arousal/Alertness: Awake/alert Behavior During Therapy: WFL for tasks assessed/performed Overall Cognitive Status: Within Functional Limits for tasks assessed                     General Comments   pts son present for most of eval and was very encouraging            Home Living Family/patient expects to be discharged to:: Assisted living                             Home Equipment: None          Prior Functioning/Environment Level of Independence: Independent        Comments: Pt and ex-wife  report pt is largely IND at ALF         OT Problem List: Decreased strength;Impaired UE functional use;Impaired balance (sitting and/or standing)   OT Treatment/Interventions: Self-care/ADL training;DME and/or AE instruction;Patient/family education;Therapeutic activities    OT Goals(Current goals can be found in the care plan section) Acute Rehab OT Goals Patient Stated Goal: Regain IND OT Goal Formulation: With patient Time For Goal Achievement: 05/04/16 Potential to Achieve Goals: Good  OT Frequency: Min 3X/week   Barriers to D/C:               End of Session Nurse Communication: Mobility status  Activity Tolerance: Patient tolerated treatment well Patient left: in chair;with call bell/phone within reach;with family/visitor present   Time: [id number] OT Time Calculation (min): 32 min Charges:  OT General Charges $OT Visit: 1  Procedure OT Evaluation $OT Eval Moderate Complexity: 1 Procedure OT Treatments $Self Care/Home Management : 8-22 mins G-Codes:    Einar CrowEDDING, Ponce Skillman D 04/20/2016, 5:15 PM

## 2016-04-20 NOTE — Progress Notes (Signed)
PT Cancellation Note  Patient Details Name: Daniel Oconnell MRN: [id number] DOB: [birth year]   Cancelled Treatment:    Reason Eval/Treat Not Completed: Fatigue/lethargy limiting ability to participate Checked on pt this morning and he reported a rough morning, requesting to rest.  Son present and states breakfast ordered and to please check back this afternoon.  Upon checking back, both pt and son sleeping.  Will check back as schedule permits.   [name],KATHrine E 04/20/2016, 1:36 PM Zenovia JarredKati Donavon Kimrey, PT, DPT 04/20/2016 Pager: [phone]

## 2016-04-21 LAB — BASIC METABOLIC PANEL
ANION GAP: 6 (ref 5–15)
BUN: 14 mg/dL (ref 6–20)
CO2: 32 mmol/L (ref 22–32)
Calcium: 8.7 mg/dL — ABNORMAL LOW (ref 8.9–10.3)
Chloride: 98 mmol/L — ABNORMAL LOW (ref 101–111)
Creatinine, Ser: 0.9 mg/dL (ref 0.61–1.24)
GFR calc Af Amer: >60 mL/min (ref >60)
Glucose, Bld: 99 mg/dL (ref 65–99)
POTASSIUM: 4.3 mmol/L (ref 3.5–5.1)
SODIUM: 136 mmol/L (ref 135–145)

## 2016-04-21 MED ORDER — FUROSEMIDE 10 MG/ML IJ SOLN
40.0000 mg | Freq: Two times a day (BID) | INTRAMUSCULAR | Status: DC
  Administered 2016-04-21 – 2016-04-22 (×2): 40 mg via INTRAVENOUS
  Filled 2016-04-21 (×2): qty 4

## 2016-04-21 MED ORDER — FUROSEMIDE 10 MG/ML IJ SOLN: 40.0000 mg | Freq: Once | INTRAMUSCULAR | Status: DC

## 2016-04-21 NOTE — Progress Notes (Signed)
PROGRESS NOTE    Daniel Oconnell  GNF:[id number] DOB: [birth year] DOA: 04/16/2016 PCP: No primary care provider on file.    Brief Narrative:   Daniel Oconnell is a 60 y.o. 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  History was obtained from his daughter as well as from the patient in a kind of charades game - he is able to say yes and no (reasonably accurately) and can make a few other intelligible sounds and also uses gestures and facial expressions.  He reports feeling nauseated Saturday night, explosive diarrhea for several days.  Fell Saturday night.  Developed RLQ abdominal pain.  Worsened to the point Tuesday that he felt the need to call 911 and come to ER.  Urine culture negative, CT unremarkable.  O2 levels 83-84% before discharge, but since he has COPD they discharged him.  Last night, pain worse again. Got meds overnight but still hurting this AM.  He once again was sent back to the ER. Pain worse with moving, elevation of bed.  No emesis with prior stomach bug, uncertain if he has a gag reflex.  No cough.  Possibly had fever on Saturday, Sunday.       Assessment & Plan:   Principal Problem:   Acute respiratory failure with hypoxia (HCC) Active Problems:   HCAP (healthcare-associated pneumonia)   Abdominal pain, acute, right lower quadrant   Nonspecific abnormal electrocardiogram (ECG) (EKG)   H/O: CVA (cerebrovascular accident)   COPD (chronic obstructive pulmonary disease) (HCC)   CAD (coronary artery disease)   Essential hypertension   Right lower quadrant abdominal pain   Adrenal abnormality (HCC)   Expressive aphasia   #1 acute respiratory failure with hypoxia likely secondary to healthcare associated pneumonia Patient had presented with hypoxia with sats in the 80s. ABG showed a metabolic alkalosis with a respiratory acidosis.  Patient with history of COPD however PCO2 was normal less likely to be date urology. Patient with a poor cough reflex secondary to history of CVA and aphasia. Chest x-ray was negative for any infiltrate. CT angiogram did show atelectasis versus infiltrate. Blood cultures with no growth to date. Patient was placed empirically on IV antibiotics for probable healthcare associated pneumonia. Discontinued IV vancomycin. D/C IV cefepime. WBC trending down. Patient afebrile. Patient with clinical improvement. Transitioned to oral Levaquin.  Patient noted to desat in the 80s on room air on ambulation. Will give patient 3 doses of IV Lasix and repeat O2 sats on room air and on ambulation. If no significant improvement patient likely qualifies for home O2 and will likely need to be discharged on home O2.   #2 abdominal pain Questionable etiology. Patient with recent CT scan that showed probable cystitis. CT also showed possible adrenal nodularity. Patient currently asymptomatic. Urinalysis was nitrite negative leukocytes negative. Urine culture pending. Continue empiric IV antibiotics. MRI negative for adrenal abnormality. Supportive care.  #3 abnormal EKG/history of coronary artery disease status post CABG EKG with T-wave inversions in leads 1, aVL, V3-V6. Patient denies any chest pain. Cardiac enzymes negative 3. 2-D echo pending. Continue aspirin, Lipitor, Plavix, hydralazine. Continue norvasc and labetalol.   #4 hypertension Blood pressure improved with hydration. Patient was on losartan.  Continue Norvasc and Labetalol.  #5 history of CVA Patient with dysarthric speech. Continue aspirin and Plavix for secondary stroke prevention. PT/OT/ST.  #6 urinary frequency Postvoid residual with 215 mL. Continue Flomax. Saline  lock IV fluids. Will likely need outpatient follow-up with urology as per CT scan patient with an enlarged prostate.   DVT prophylaxis: Lovenox  Code Status: DO NOT RESUSCITATE Family  Communication: Updated patient and family at bedside. Disposition Plan: Home when medically improved and hypoxia has improved, hopefully in 1-2 days.   Consultants:   None  Procedures:   CT angiogram chest 04/16/2016  Chest x-ray 04/16/2016  MRI abdomen 04/20/2016  Antimicrobials:   IV cefepime 04/16/2016 >>>>>> 04/20/2016  IV vancomycin 04/16/2016>>>>> 04/18/2016  Oral Levaquin 04/20/2016   Subjective: Easily arousable. Dysarthric speech. Patient denies chest pain. Patient states SOB improving. Patient denies any abdominal pain. Patient noted to desat on ambulation on room air.  Objective: Vitals:   04/21/16 0926 04/21/16 1042 04/21/16 1043 04/21/16 1045  BP:   130/80   Pulse:   68   Resp:      Temp:      TempSrc:      SpO2: 96% 96%  (!) 87%  Weight:      Height:        Intake/Output Summary (Last 24 hours) at 04/21/16 1312 Last data filed at 04/21/16 1000  Gross per 24 hour  Intake              480 ml  Output              500 ml  Net              -20 ml   Filed Weights   04/16/16 2125  Weight: 89.5 kg (197 lb 5 oz)    Examination:  General exam: Appears calm and comfortable  Respiratory system: Some scattered coarse bibasilar breath sounds otherwise clear. No wheezing. Respiratory effort normal. Cardiovascular system: S1 & S2 heard, RRR. No JVD, murmurs, rubs, gallops or clicks. No pedal edema. Gastrointestinal system: Abdomen is nondistended, soft and nontender. No organomegaly or masses felt. Normal bowel sounds heard. Central nervous system: Alert and oriented. No focal neurological deficits. Extremities: Symmetric 5 x 5 power. Skin: No rashes, lesions or ulcers Psychiatry: Judgement and insight appear normal. Mood & affect appropriate.     Data Reviewed: I have personally reviewed following labs and imaging studies  CBC:  Recent Labs Lab 04/14/16 2218 04/16/16 1317 04/17/16 0151 04/18/16 0604 04/19/16 0547 04/20/16 0946  WBC 14.6*  11.5* 10.8* 9.1 9.9 10.3  NEUTROABS 11.6* 9.3*  --   --   --   --   HGB 14.5 13.4 12.3* 12.7* 13.6 13.4  HCT 41.4 39.2 36.1* 36.7* 40.4 39.0  MCV 83.3 84.8 84.0 84.0 84.9 84.6  PLT 199 197 191 203 264 262   Basic Metabolic Panel:  Recent Labs Lab 04/17/16 0151 04/17/16 0843 04/18/16 0604 04/19/16 0547 04/20/16 0946 04/21/16 0544  NA 138  --  140 139 135 136  K 3.1*  --  3.6 3.5 3.9 4.3  CL 105  --  105 100* 99* 98*  CO2 27  --  27 31 30  32  GLUCOSE 98  --  96 104* 103* 99  BUN 12  --  11 7 13 14   CREATININE 0.88  --  0.80 0.77 0.76 0.90  CALCIUM 7.8*  --  7.8* 8.6* 8.6* 8.7*  MG  --  1.9  --   --   --   --    GFR: Estimated Creatinine Clearance: 95.8 mL/min (by C-G formula based on SCr of 0.9 mg/dL). Liver Function Tests:  Recent Labs Lab 04/14/16  2218 04/16/16 1317  AST 18 18  ALT 18 17  ALKPHOS 69 60  BILITOT 1.2 1.2  PROT 6.7 6.3*  ALBUMIN 3.8 3.5    Recent Labs Lab 04/14/16 2218 04/16/16 1317  LIPASE 18 22   No results for input(s): AMMONIA in the last 168 hours. Coagulation Profile:  Recent Labs Lab 04/16/16 2208  INR 1.20   Cardiac Enzymes:  Recent Labs Lab 04/17/16 0151 04/17/16 0843 04/17/16 1525  TROPONINI <0.03 <0.03 <0.03   BNP (last 3 results) No results for input(s): PROBNP in the last 8760 hours. HbA1C: No results for input(s): HGBA1C in the last 72 hours. CBG: No results for input(s): GLUCAP in the last 168 hours. Lipid Profile: No results for input(s): CHOL, HDL, LDLCALC, TRIG, CHOLHDL, LDLDIRECT in the last 72 hours. Thyroid Function Tests: No results for input(s): TSH, T4TOTAL, FREET4, T3FREE, THYROIDAB in the last 72 hours. Anemia Panel: No results for input(s): VITAMINB12, FOLATE, FERRITIN, TIBC, IRON, RETICCTPCT in the last 72 hours. Sepsis Labs:  Recent Labs Lab 04/16/16 2023 04/16/16 2208 04/16/16 2213 04/17/16 0151  PROCALCITON  --  <0.10  --   --   LATICACIDVEN 0.63  --  1.1 0.7    Recent Results (from  the past 240 hour(s))  Urine culture     Status: Abnormal   Collection Time: 04/15/16  2:13 AM  Result Value Ref Range Status   Specimen Description URINE, CLEAN CATCH  Final   Special Requests NONE  Final   Culture (A)  Final    <10,000 COLONIES/mL INSIGNIFICANT GROWTH Performed at [facility]    Report Status 04/16/2016 FINAL  Final  Urine culture     Status: None   Collection Time: 04/16/16  6:30 PM  Result Value Ref Range Status   Specimen Description URINE, CLEAN CATCH  Final   Special Requests NONE  Final   Culture NO GROWTH Performed at [facility]   Final   Report Status 04/18/2016 FINAL  Final  MRSA PCR Screening     Status: None   Collection Time: 04/16/16 10:11 PM  Result Value Ref Range Status   MRSA by PCR NEGATIVE NEGATIVE Final    Comment:        The GeneXpert MRSA Assay (FDA approved for NASAL specimens only), is one component of a comprehensive MRSA colonization surveillance program. It is not intended to diagnose MRSA infection nor to guide or monitor treatment for MRSA infections.   Culture, blood (x 2)     Status: None (Preliminary result)   Collection Time: 04/16/16 10:13 PM  Result Value Ref Range Status   Specimen Description BLOOD LEFT HAND  Final   Special Requests BOTTLES DRAWN AEROBIC AND ANAEROBIC 6CC  Final   Culture   Final    NO GROWTH 3 DAYS Performed at [facility]    Report Status PENDING  Incomplete  Culture, blood (x 2)     Status: None (Preliminary result)   Collection Time: 04/16/16 10:13 PM  Result Value Ref Range Status   Specimen Description BLOOD LEFT ARM  Final   Special Requests IN PEDIATRIC BOTTLE 2CC  Final   Culture   Final    NO GROWTH 3 DAYS Performed at [facility]    Report Status PENDING  Incomplete         Radiology Studies: Mr Abdomen W Wo Contrast  Result Date: 04/20/2016 CLINICAL DATA:  Adrenal nodularity on CT EXAM: MRI ABDOMEN WITHOUT AND WITH CONTRAST  TECHNIQUE:  Multiplanar multisequence MR imaging of the abdomen was performed both before and after the administration of intravenous contrast. CONTRAST:  20mL MULTIHANCE GADOBENATE DIMEGLUMINE 529 MG/ML IV SOLN COMPARISON:  CT abdomen/ pelvis dated 04/06/2016 FINDINGS: Motion degraded images. Lower chest: Trace left pleural effusion. Hepatobiliary: Mild hepatic steatosis. No focal hepatic lesion is seen. Gallbladder is within normal limits. No intrahepatic or extrahepatic ductal dilatation. Pancreas:  Within normal limits. Spleen:  Within normal limits. Adrenals/Urinary Tract: Mild thickening of the right adrenal gland inferiorly (series 8/image 22). Left adrenal gland is within normal limits, noting a prominent adjacent vessel. No discrete adrenal nodule/mass is seen. Kidneys are within normal limits.  No hydronephrosis. Stomach/Bowel: Stomach is within normal limits. Visualized bowel is unremarkable. Vascular/Lymphatic:  No evidence of abdominal aortic aneurysm. No suspicious abdominal lymphadenopathy. Other:  No abdominal ascites. Musculoskeletal: Degenerative changes of the lumbar spine. IMPRESSION: Motion degraded images. No discrete adrenal nodule/mass is seen on MRI. Mild hepatic steatosis. Trace left pleural effusion. Electronically Signed   By: Charline BillsSriyesh  Krishnan M.D.   On: 04/20/2016 09:19        Scheduled Meds: . amLODipine  10 mg Oral QHS  . aspirin  325 mg Oral Q breakfast  . atorvastatin  80 mg Oral QHS  . baclofen  20 mg Oral TID  . budesonide (PULMICORT) nebulizer solution  0.25 mg Nebulization BID  . clopidogrel  75 mg Oral Q breakfast  . enoxaparin (LOVENOX) injection  40 mg Subcutaneous Q24H  . furosemide  40 mg Intravenous Once  . hydrALAZINE  100 mg Oral BID  . ipratropium-albuterol  3 mL Nebulization TID  . labetalol  200 mg Oral BID  . lactobacillus acidophilus & bulgar  1 tablet Oral Q breakfast  . levETIRAcetam  750 mg Oral BID  . levofloxacin  750 mg Oral Daily  . loratadine  10  mg Oral Daily  . polyethylene glycol  17 g Oral Daily  . senna-docusate  1 tablet Oral QHS  . sertraline  100 mg Oral Q breakfast  . tamsulosin  0.4 mg Oral Daily   Continuous Infusions:    LOS: 5 days    Time spent: 35 minutes    THOMPSON,DANIEL, MD [facility] Pager [phone]  If 7PM-7AM, please contact night-coverage [email or web address] Password [email or web address] 04/21/2016, 1:12 PM

## 2016-04-21 NOTE — Progress Notes (Signed)
Occupational Therapy Treatment Patient Details Name: Hedy JacobKirk Stamas MRN: [id number] DOB: 04/06/1956 Today's Date: 04/21/2016    History of present illness Pt admitted with acute abdominal pain, HCAP and resp failure.  Pt with hx of COPD, CAD, CABG and CVA with residual effects including homonymous hemianopsia, apraxia, expressive aphasia, decreased R side sensation, limited use of R hand and apraxia of R side   OT comments  Pt overall S this OT visit- goals are mod I  Follow Up Recommendations  Home health OT    Equipment Recommendations  None recommended by OT    Recommendations for Other Services      Precautions / Restrictions Precautions Precautions: None Precaution Comments: expressive aphasia Restrictions Weight Bearing Restrictions: No       Mobility Bed Mobility            I      Transfers          overall S with VC for safety                ADL   Eating/Feeding: Set up;Sitting   Grooming: Set up;Sitting   Upper Body Bathing: Set up;Sitting;Cueing for safety;Cueing for sequencing;Cueing for compensatory techniques   Lower Body Bathing: Set up;Minimal assistance   Upper Body Dressing : Set up;Sitting   Lower Body Dressing: Set up;Sit to/from stand   Toilet Transfer: Supervision/safety;Ambulation;Cueing for safety   Toileting- Clothing Manipulation and Hygiene: Supervision/safety;Sit to/from stand         General ADL Comments: pt overall S this OT visit.  Monitored pts Oxygen sats during OT session           Extremity/Trunk Assessment     Pts splint on upon OT arrival.  No reddened areas noted.  Removed splint and encouraged pt to use RUE as he is able                  Progress Toward Goals  OT Goals(current goals can now be found in the care plan section)  Progress towards OT goals: Progressing toward goals     Plan Discharge plan remains appropriate            Activity Tolerance Patient tolerated treatment  well   Patient Left in chair;with call bell/phone within reach;with family/visitor present   Nurse Communication Mobility status        Time: [id number] OT Time Calculation (min): 31 min  Charges: OT General Charges $OT Visit: 1 Procedure OT Treatments $Self Care/Home Management : 23-37 mins  Maxamillian Tienda, [place] KungLorraine D 04/21/2016, 10:46 AM

## 2016-04-21 NOTE — Progress Notes (Signed)
[facility] CM [facility] about PT/OT needs. I was informed that they have their own in house PT department. Orders just need to be sent with discharge packet. Orders received from MD and CSW made aware. No other CM needs communicated. Sandford Crazeora Pattrick Bady RN,BSN,NCM [phone]

## 2016-04-21 NOTE — Progress Notes (Signed)
SATURATION QUALIFICATIONS: (This note is used to comply with regulatory documentation for home oxygen)  Patient Saturations on Room Air at Rest = 88%  Patient Saturations on Room Air while Ambulating = 84%  Patient Saturations on 2 Liters of oxygen while Ambulating = 92%  Please briefly explain why patient needs home oxygen: 

## 2016-04-21 NOTE — Progress Notes (Signed)
Physical Therapy Treatment Patient Details Name: Daniel Oconnell MRN: [id number] DOB: May 23, 1955 Today's Date: 04/21/2016    History of Present Illness Pt admitted with acute abdominal pain, HCAP and resp failure.  Pt with hx of COPD, CAD, CABG and CVA with residual effects including homonymous hemianopsia, apraxia, expressive aphasia, decreased R side sensation, limited use of R hand and apraxia of R side    PT Comments    Patient was seen in recliner upon arrival with family present. Performed sit to stand x min guard for initial balance with standing. Patient very impulsive. Ambulated to bathroom and performed standing pivot transfer x min guard. VCs for UE placement and safety awareness.  Gait x 120 ft x min guard with 1 person hand held assist with VC's required to maintain upright posture. Patient required 1 rest break. On average SPO2 was 90% on 1 L of O2 during ambulation. Dropped to 87% on RA during gait. See vital signs for details.  Patient is impulsive and displays expressive aphasia during treatment.   Follow Up Recommendations  No PT follow up     Equipment Recommendations       Recommendations for Other Services       Precautions / Restrictions Precautions Precautions: None Precaution Comments: expressive aphasia Restrictions Weight Bearing Restrictions: No    Mobility  Bed Mobility                  Transfers Overall transfer level: Needs assistance Equipment used: None Transfers: Sit to/from Stand;Stand Pivot Transfers Sit to Stand: Min guard         General transfer comment: min guard for balance with initial standing. VC's for hand placement when standing and for safety awareness when standing in the bathroom. Very impulsive.  Ambulation/Gait Ambulation/Gait assistance: Min guard   Assistive device: 1 person hand held assist  Gait distance: 120 ft.  Gait velocity: decr Gait velocity interpretation: Below normal speed for age/gender General Gait  Details: VC's to maintain upright posture. I rest break required. On average SPO2 was 90% on 1 L of O2. Dropped to 87% on RA.    Stairs            Wheelchair Mobility    Modified Rankin (Stroke Patients Only)       Balance                                    Cognition Arousal/Alertness: Awake/alert Behavior During Therapy: WFL for tasks assessed/performed Overall Cognitive Status: Within Functional Limits for tasks assessed                      Exercises      General Comments        Pertinent Vitals/Pain Pain Assessment: No/denies pain    Home Living                      Prior Function            PT Goals (current goals can now be found in the care plan section) Progress towards PT goals: Progressing toward goals    Frequency    Min 3X/week      PT Plan Current plan remains appropriate    Co-evaluation             End of Session Equipment Utilized During Treatment: Gait belt;Oxygen Activity Tolerance: Patient tolerated treatment well;Patient  limited by fatigue Patient left: in chair;with call bell/phone within reach;with chair alarm set;with family/visitor present     Time:  - 10:36 - 11:07    Charges:    1 gt    1 ta                    G CodesMarcelino Scot:      Caitlin Medlin, SPTA [facility] Acute Rehab [phone]  Present and agree with above  Felecia ShellingLori Dilia Alemany  PTA [facility]  Acute  Rehab Pager      [phone]

## 2016-04-22 ENCOUNTER — Encounter (HOSPITAL_COMMUNITY): Payer: Self-pay | Admitting: Family Medicine

## 2016-04-22 DIAGNOSIS — J42 Unspecified chronic bronchitis: Secondary | ICD-10-CM

## 2016-04-22 LAB — BASIC METABOLIC PANEL
ANION GAP: 7 (ref 5–15)
BUN: 20 mg/dL (ref 6–20)
CALCIUM: 8.6 mg/dL — AB (ref 8.9–10.3)
CO2: 32 mmol/L (ref 22–32)
Chloride: 98 mmol/L — ABNORMAL LOW (ref 101–111)
Creatinine, Ser: 1 mg/dL (ref 0.61–1.24)
GFR calc Af Amer: >60 mL/min (ref >60)
GLUCOSE: 99 mg/dL (ref 65–99)
Potassium: 3.6 mmol/L (ref 3.5–5.1)
SODIUM: 137 mmol/L (ref 135–145)

## 2016-04-22 MED ORDER — ALBUTEROL SULFATE (2.5 MG/3ML) 0.083% IN NEBU: 2.5000 mg | INHALATION_SOLUTION | RESPIRATORY_TRACT | 0 refills | Status: DC | PRN

## 2016-04-22 MED ORDER — BUDESONIDE 0.25 MG/2ML IN SUSP: 0.2500 mg | Freq: Two times a day (BID) | RESPIRATORY_TRACT | 0 refills | Status: DC

## 2016-04-22 MED ORDER — TAMSULOSIN HCL 0.4 MG PO CAPS: 0.4000 mg | ORAL_CAPSULE | Freq: Every day | ORAL | 0 refills | Status: DC

## 2016-04-22 MED ORDER — TAMSULOSIN HCL 0.4 MG PO CAPS: 0.4000 mg | ORAL_CAPSULE | Freq: Every day | ORAL | 0 refills | Status: AC

## 2016-04-22 MED ORDER — LEVOFLOXACIN 750 MG PO TABS: 750.0000 mg | ORAL_TABLET | Freq: Every day | ORAL | 0 refills | Status: AC

## 2016-04-22 MED ORDER — LEVOFLOXACIN 750 MG PO TABS: 750.0000 mg | ORAL_TABLET | Freq: Every day | ORAL | 0 refills | Status: DC

## 2016-04-22 NOTE — Care Management Note (Signed)
Case Management Note  Patient Details  Name: Bodhi Moradi MRN: [id number] Date of Birth: May 16, 1956  Subjective/Objective:                  hypoxia Action/Plan: Discharge planning Expected Discharge Date:  04/23/16              Expected Discharge Plan:  Assisted Living / Rest Home  In-House Referral:     Discharge planning Services  CM Consult  Post Acute Care Choice:  NA, Durable Medical Equipment Choice offered to:  NA, Adult Children  DME Arranged:  Oxygen DME Agency:  [place] Arranged:  NA [place] Agency:  NA  Status of Service:  Completed, signed off  If discussed at [place] of Stay Meetings, dates discussed:    Additional Comments: CM met with pt and son of pt (who will be transporting pt home to [facility]) for choice of home oxygen.  Referral made to [facility] rep, BRAD.  CM has spoken with CSW Elmyra Ricks to make aware of O2 setup.  NO other CM needs were communicated. Dellie Catholic, RN 04/22/2016, 2:31 PM

## 2016-04-22 NOTE — Discharge Summary (Addendum)
Physician Discharge Summary  Daniel Oconnell ZOX:[id number] DOB: April 08, 1956 DOA: 04/16/2016  PCP: [facility]  Admit date: 04/16/2016 Discharge date: 04/22/2016  Admitted From: ALF Disposition: ALF  Recommendations for Outpatient Follow-up:  1. Follow up with PCP in 1-2 weeks 2. 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 History was obtained from his daughter as well as from the patient in a kind of charades game - he is able to say yes and no (reasonably accurately) and can make a few other intelligible sounds and also uses gestures and facial expressions. He reports feeling nauseated Saturday night, explosive diarrhea for several days. Fell Saturday night. Developed RLQ abdominal pain. Worsened to the point Tuesday that he felt the need to call 911 and come to ER. Urine culture negative, CT unremarkable. O2 levels 83-84% before discharge, but since he has COPD they discharged him. Last night, pain worse again. Got meds overnight but still hurting this AM. He once again was sent back to the ER. Pain worse with moving, elevation of bed. No emesis with priorstomach bug, uncertain if he has a gag reflex. No cough. Possibly had fever on Saturday, Sunday.   #1 acute respiratory failure with hypoxia likely secondary to healthcare associated pneumonia Patient had presented with hypoxia with sats in the 80s. ABG showed a metabolic alkalosis with a respiratory acidosis. Patient with history of COPD however PCO2 was normal less likely to be date urology. Patient with a poor cough reflex secondary to history of CVA and aphasia. Chest x-ray  was negative for any infiltrate. CT angiogram did show atelectasis versus infiltrate. Blood cultures with no growth to date. Patient was placed empirically on IV antibiotics for probable healthcare associated pneumonia. Discontinued IV vancomycin. D/C IV cefepime. WBC trending down. Patient afebrile. Patient with clinical improvement. Transitioned to oral Levaquin to take thru 04/25/16 then STOP.   Patient noted to desat in the 80s on room air on ambulation. Likely to be discharged on home oxygen if qualifies.   #2 abdominal pain Questionable etiology. Patient with recent CT scan that showed probable cystitis. CT also showed possible adrenal nodularity. Patient currently asymptomatic. Urinalysis was nitrite negative leukocytes negative. Urine culture pending. Continue empiric IV antibiotics. MRI negative for adrenal abnormality. Supportive care.  #3 abnormal EKG/history of coronary artery disease status post CABG EKG with T-wave inversions in leads 1, aVL, V3-V6. Patient denies any chest pain. Cardiac enzymes negative 3. 2-D echo pending. Continue aspirin, Lipitor, Plavix, hydralazine. Continue norvasc and labetalol.   #4 hypertension Blood pressure improved with hydration. Patient was on losartan.  Continue Norvasc and Labetalol.  #5 history of CVA Patient with dysarthric speech. Continue aspirin and Plavix for secondary stroke prevention. Continue PT/OT/ST at SNF.  #6 urinary frequency Postvoid residual with 215 mL. Continue Flomax. Saline lock IV fluids. Outpatient follow-up with urology as per CT scan patient with an enlarged prostate.   DVT prophylaxis: Lovenox  Code Status: DO NOT RESUSCITATE Family Communication: Updated patient and family at bedside. Disposition Plan: Home when medically improved and hypoxia has improved, hopefully in 1-2 days.  Consultants:   None  Procedures:   CT angiogram chest 04/16/2016  Chest x-ray 04/16/2016  MRI abdomen  04/20/2016  Antimicrobials:  IV cefepime 04/16/2016 >>>>>> 04/20/2016  IV vancomycin 04/16/2016>>>>> 04/18/2016  Oral Levaquin 04/20/2016 TAKE THRU 04/25/16  Discharge Diagnoses:  Principal Problem:   Acute respiratory failure with hypoxia (HCC) Active Problems:   HCAP (healthcare-associated pneumonia)   Abdominal pain, acute, right lower quadrant   Nonspecific abnormal electrocardiogram (ECG) (EKG)   H/O: CVA (cerebrovascular accident)   COPD (chronic obstructive pulmonary disease) (HCC)   CAD (coronary artery disease)   Essential hypertension   Right lower quadrant abdominal pain   Adrenal abnormality (HCC)   Expressive aphasia  Discharge Instructions  Discharge Instructions    Ambulatory referral to Urology    Complete by:  As directed    Urinary retention, enlarged prostate   Increase activity slowly    Complete by:  As directed        Medication List    TAKE these medications   acetaminophen 500 MG tablet Commonly known as:  TYLENOL Take 500 mg by mouth every 4 (four) hours as needed for mild pain, moderate pain, fever or headache.   albuterol (2.5 MG/3ML) 0.083% nebulizer solution Commonly known as:  PROVENTIL Take 3 mLs (2.5 mg total) by nebulization every 4 (four) hours as needed for wheezing or shortness of breath.   amLODipine 10 MG tablet Commonly known as:  NORVASC Take 10 mg by mouth at bedtime.   aspirin 325 MG EC tablet Take 325 mg by mouth daily with breakfast.   atorvastatin 80 MG tablet Commonly known as:  LIPITOR Take 80 mg by mouth at bedtime.   baclofen 20 MG tablet Commonly known as:  LIORESAL Take 20 mg by mouth 3 (three) times daily.   budesonide 0.25 MG/2ML nebulizer solution Commonly known as:  PULMICORT Take 2 mLs (0.25 mg total) by nebulization 2 (two) times daily.   cetirizine 10 MG tablet Commonly known as:  ZYRTEC Take 10 mg by mouth daily with breakfast.   clopidogrel 75 MG tablet Commonly known as:  PLAVIX Take  75 mg by mouth daily with breakfast.   dicyclomine 20 MG tablet Commonly known as:  BENTYL Take 1 tablet (20 mg total) by mouth 2 (two) times daily. Take for abdominal pain/cramping   hydrALAZINE 100 MG tablet Commonly known as:  APRESOLINE Take 100 mg by mouth 2 (two) times daily.   hydrOXYzine 25 MG tablet Commonly known as:  ATARAX/VISTARIL Take 25 mg by mouth every 6 (six) hours as needed for itching.   labetalol 200 MG tablet Commonly known as:  NORMODYNE Take 200 mg by mouth 2 (two) times daily.   lactobacillus acidophilus & bulgar chewable tablet Chew 1 tablet by mouth daily with breakfast.   levETIRAcetam 750 MG tablet Commonly known as:  KEPPRA Take 750 mg by mouth 2 (two) times daily.   levofloxacin 750 MG tablet Commonly known as:  LEVAQUIN Take 1 tablet (750 mg total) by mouth daily.  TAKE THRU 04/25/16 then STOP   loperamide 2 MG capsule Commonly known as:  IMODIUM Take by mouth as needed for diarrhea or loose stools.   losartan 50 MG tablet Commonly known as:  COZAAR Take 50 mg by mouth daily with breakfast.   magnesium hydroxide 400 MG/5ML suspension Commonly known as:  MILK OF MAGNESIA Take 30 mLs by mouth at bedtime as needed for mild constipation.   MINTOX 200-200-20 MG/5ML suspension Generic drug:  alum & mag hydroxide-simeth Take 30 mLs by mouth as needed for indigestion or heartburn.   neomycin-bacitracin-polymyxin ointment Commonly known as:  NEOSPORIN Apply 1  application topically as needed for wound care.   ondansetron 4 MG disintegrating tablet Commonly known as:  ZOFRAN ODT Take 1 tablet (4 mg total) by mouth every 8 (eight) hours as needed for nausea or vomiting.   polyethylene glycol powder powder Commonly known as:  GLYCOLAX/MIRALAX Take 17 g by mouth 2 (two) times daily. Use for constipation, as prescribed, until daily soft stools  OTC   ROBAFEN 100 MG/5ML syrup Generic drug:  guaifenesin Take 200 mg by mouth every 6 (six) hours  as needed for cough.   sertraline 100 MG tablet Commonly known as:  ZOLOFT Take 100 mg by mouth daily with breakfast.   tamsulosin 0.4 MG Caps capsule Commonly known as:  FLOMAX Take 1 capsule (0.4 mg total) by mouth daily.   tiotropium 18 MCG inhalation capsule Commonly known as:  SPIRIVA Place 18 mcg into inhaler and inhale daily.   vitamin B-12 1000 MCG tablet Commonly known as:  CYANOCOBALAMIN Take 1,000 mcg by mouth daily with breakfast.   Vitamin D 2000 units tablet Take 2,000 Units by mouth daily with breakfast.   zolpidem 12.5 MG CR tablet Commonly known as:  AMBIEN CR Take 12.5 mg by mouth at bedtime.      Follow-up Information    [facility]. Schedule an appointment as soon as possible for a visit in 1 week(s).   Why:  Hospital Follow Up        [facility]. Schedule an appointment as soon as possible for a visit in 2 week(s).   Why:  Hospital Follow UP        [facility] Schedule an appointment as soon as possible for a visit in 2 week(s).   Why:  Establish Care: Urinary Retention and enlarged prostate Contact information: [address]  [place] [address] [place] [address] [phone]          Allergies  Allergen Reactions  . Sulfa Antibiotics Other (See Comments)   Procedures/Studies: Dg Chest 2 View  Result Date: 04/16/2016 CLINICAL DATA:  Hypoxia EXAM: CHEST  2 VIEW COMPARISON:  None. FINDINGS: Borderline cardiomegaly. Status post CABG. There is mild interstitial prominence bilaterally without convincing pulmonary edema. Osteopenia and mild degenerative changes thoracic spine. No segmental infiltrate. IMPRESSION: Mild interstitial prominence bilateral without convincing pulmonary edema. No segmental infiltrate. Electronically Signed   By: Natasha Mead M.D.   On: 04/16/2016 14:37   Ct Angio Chest Pe W And/or Wo Contrast  Result Date: 04/16/2016 CLINICAL DATA:  Right upper quadrant pain and hypoxia. EXAM: CT ANGIOGRAPHY  CHEST WITH CONTRAST TECHNIQUE: Multidetector CT imaging of the chest was performed using the standard protocol during bolus administration of intravenous contrast. Multiplanar CT image reconstructions and MIPs were obtained to evaluate the vascular anatomy. CONTRAST:  100 cc Isovue 370 COMPARISON:  None. FINDINGS: Cardiovascular: Heart is enlarged. Coronary artery calcification is noted. Patient is status post CABG Atherosclerotic calcification is noted in the wall of the thoracic aorta. No thoracic aortic dissection. 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 Pulmonary arterial opacification is suboptimal secondary to the central venous stenosis, but within this limitation there is no evidence for large central pulmonary embolus in the main pulmonary outflow tract, main pulmonary arteries, or lobar pulmonary arteries. There is no definite pulmonary embolus in segmental and subsegmental pulmonary arteries although assessment may not be reliable given bolus timing. Mediastinum/Nodes: No mediastinal lymphadenopathy.  There is no hilar lymphadenopathy. Borderline lymphadenopathy is identified in the right hilum. The esophagus has normal imaging features. There is no axillary lymphadenopathy. Lungs/Pleura: Emphysema noted bilaterally with interlobular septal thickening and bronchial wall thickening. Dependent collapse/consolidation is identified in both lower lobes and small bilateral pleural effusions are evident. Upper Abdomen: Unremarkable. Musculoskeletal: Bone windows reveal no worrisome lytic or sclerotic osseous lesions. 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 Review of the MIP images confirms the above findings. IMPRESSION: 1. Suboptimal bolus timing due to central venous stenosis. Within this limitation, there is no evidence for large central pulmonary embolus. While no  embolic disease is seen in segmental or subsegmental pulmonary arteries, evaluation may not be reliable given the bolus timing. 2. Coronary artery and thoracic aortic atherosclerosis. 3. Dependent collapse/consolidation of both lower lobes with small bilateral pleural effusions. Electronically Signed   By: Kennith CenterEric  Mansell M.D.   On: 04/16/2016 18:11   Mr Abdomen W Wo Contrast  Result Date: 04/20/2016 CLINICAL DATA:  Adrenal nodularity on CT EXAM: MRI ABDOMEN WITHOUT AND WITH CONTRAST TECHNIQUE: Multiplanar multisequence MR imaging of the abdomen was performed both before and after the administration of intravenous contrast. CONTRAST:  20mL MULTIHANCE GADOBENATE DIMEGLUMINE 529 MG/ML IV SOLN COMPARISON:  CT abdomen/ pelvis dated 04/06/2016 FINDINGS: Motion degraded images. Lower chest: Trace left pleural effusion. Hepatobiliary: Mild hepatic steatosis. No focal hepatic lesion is seen. Gallbladder is within normal limits. No intrahepatic or extrahepatic ductal dilatation. Pancreas:  Within normal limits. Spleen:  Within normal limits. Adrenals/Urinary Tract: Mild thickening of the right adrenal gland inferiorly (series 8/image 22). Left adrenal gland is within normal limits, noting a prominent adjacent vessel. No discrete adrenal nodule/mass is seen. Kidneys are within normal limits.  No hydronephrosis. Stomach/Bowel: Stomach is within normal limits. Visualized bowel is unremarkable. Vascular/Lymphatic:  No evidence of abdominal aortic aneurysm. No suspicious abdominal lymphadenopathy. Other:  No abdominal ascites. Musculoskeletal: Degenerative changes of the lumbar spine. IMPRESSION: Motion degraded images. No discrete adrenal nodule/mass is seen on MRI. Mild hepatic steatosis. Trace left pleural effusion. Electronically Signed   By: Charline BillsSriyesh  Krishnan M.D.   On: 04/20/2016 09:19   Ct Abdomen Pelvis W Contrast  Result Date: 04/15/2016 CLINICAL DATA:  Acute onset of lower abdominal pain. Initial encounter. EXAM:  CT ABDOMEN AND PELVIS WITH CONTRAST TECHNIQUE: Multidetector CT imaging of the abdomen and pelvis was performed using the standard protocol following bolus administration of intravenous contrast. CONTRAST:  100 mL of Isovue 300 IV contrast COMPARISON:  None. FINDINGS: Lower chest: Minimal bibasilar scarring or atelectasis is noted. The patient is status post median sternotomy. Diffuse coronary artery calcifications are seen. Hepatobiliary: The liver is unremarkable in appearance. The gallbladder is unremarkable in appearance. The common bile duct remains normal in caliber. Pancreas: The pancreas is within normal limits. Spleen: The spleen is unremarkable in appearance. Adrenals/Urinary Tract: Mild nodularity is noted at the adrenal glands bilaterally, nonspecific in appearance. The kidneys are within normal limits. There is no evidence of hydronephrosis. No renal or ureteral stones are identified. Nonspecific perinephric stranding is noted bilaterally. Stomach/Bowel: The stomach is unremarkable in appearance. The small bowel is within normal limits. The appendix is not visualized; there is no evidence for appendicitis. The colon is unremarkable in appearance. Vascular/Lymphatic: Diffuse calcification is seen along the abdominal aorta and its branches. There appears to be moderate to severe luminal narrowing at the common iliac arteries bilaterally. The inferior vena cava is grossly unremarkable. No retroperitoneal lymphadenopathy is seen. No pelvic sidewall lymphadenopathy  is identified. Reproductive: Mild soft tissue inflammation about the bladder could reflect cystitis. The prostate is enlarged, measuring 5.1 cm in transverse dimension, with minimal calcification. Other: No additional soft tissue abnormalities are seen. Musculoskeletal: No acute osseous abnormalities are identified. Vacuum phenomenon is noted at L5-S1. The visualized musculature is unremarkable in appearance. IMPRESSION: 1. Mild soft tissue  inflammation about the bladder could reflect cystitis. 2. Diffuse aortic atherosclerosis. Moderate severe luminal narrowing noted at the common iliac arteries bilaterally. 3. Enlarged prostate noted. 4. **An incidental finding of potential clinical significance has been found. Mild nodularity of the adrenal glands bilaterally, nonspecific in appearance. Would correlate with adrenal labs, and consider adrenal protocol MRI or CT for further evaluation.** 5. Diffuse coronary artery calcifications seen. Electronically Signed   By: Roanna RaiderJeffery  Chang M.D.   On: 04/15/2016 00:21     Subjective: Pt says he's feeling much better today.  Breathing better and ambulating.  Oxygen weaned down to 1 Liter.  Discharge Exam: Vitals:   04/21/16 1951 04/22/16 0428  BP:  (!) 148/85  Pulse: 91 66  Resp: 18 18  Temp:  97.4 F (36.3 C)   Vitals:   04/21/16 1943 04/21/16 1951 04/22/16 0428 04/22/16 0843  BP: 118/84  (!) 148/85   Pulse: 80 91 66   Resp: 18 18 18    Temp: 97.4 F (36.3 C)  97.4 F (36.3 C)   TempSrc: Oral  Oral   SpO2: 93% 94% 95% 94%  Weight:      Height:       General: Pt is alert, awake, not in acute distress Cardiovascular: RRR, S1/S2 +, no rubs, no gallops Respiratory: better air movement Abdominal: Soft, NT, ND, bowel sounds + Extremities: no edema, no cyanosis  The results of significant diagnostics from this hospitalization (including imaging, microbiology, ancillary and laboratory) are listed below for reference.    Microbiology: Recent Results (from the past 240 hour(s))  Urine culture     Status: Abnormal   Collection Time: 04/15/16  2:13 AM  Result Value Ref Range Status   Specimen Description URINE, CLEAN CATCH  Final   Special Requests NONE  Final   Culture (A)  Final    <10,000 COLONIES/mL INSIGNIFICANT GROWTH [facility]    Report Status 04/16/2016 FINAL  Final  Urine culture     Status: None   Collection Time: 04/16/16  6:30 PM  Result Value  Ref Range Status   Specimen Description URINE, CLEAN CATCH  Final   Special Requests NONE  Final   Culture NO GROWTH [facility]    Final   Report Status 04/18/2016 FINAL  Final  MRSA PCR Screening     Status: None   Collection Time: 04/16/16 10:11 PM  Result Value Ref Range Status   MRSA by PCR NEGATIVE NEGATIVE Final    Comment:        The GeneXpert MRSA Assay (FDA approved for NASAL specimens only), is one component of a comprehensive MRSA colonization surveillance program. It is not intended to diagnose MRSA infection nor to guide or monitor treatment for MRSA infections.   Culture, blood (x 2)     Status: None (Preliminary result)   Collection Time: 04/16/16 10:13 PM  Result Value Ref Range Status   Specimen Description BLOOD LEFT HAND  Final   Special Requests BOTTLES DRAWN AEROBIC AND ANAEROBIC 6CC  Final   Culture   Final    NO GROWTH 4 DAYS Performed at [facility]    Report Status PENDING  Incomplete  Culture, blood (x 2)     Status: None (Preliminary result)   Collection Time: 04/16/16 10:13 PM  Result Value Ref Range Status   Specimen Description BLOOD LEFT ARM  Final   Special Requests IN PEDIATRIC BOTTLE 2CC  Final   Culture   Final    NO GROWTH 4 DAYS Performed at [facility]    Report Status PENDING  Incomplete     Labs: BNP (last 3 results)  Recent Labs  04/16/16 1317  BNP 441.4*   Basic Metabolic Panel:  Recent Labs Lab 04/17/16 0843 04/18/16 0604 04/19/16 0547 04/20/16 0946 04/21/16 0544 04/22/16 0519  NA  --  140 139 135 136 137  K  --  3.6 3.5 3.9 4.3 3.6  CL  --  105 100* 99* 98* 98*  CO2  --  27 31 30  32 32  GLUCOSE  --  96 104* 103* 99 99  BUN  --  11 7 13 14 20   CREATININE  --  0.80 0.77 0.76 0.90 1.00  CALCIUM  --  7.8* 8.6* 8.6* 8.7* 8.6*  MG 1.9  --   --   --   --   --    Liver Function Tests:  Recent Labs Lab 04/16/16 1317  AST 18  ALT 17  ALKPHOS 60  BILITOT 1.2  PROT  6.3*  ALBUMIN 3.5    Recent Labs Lab 04/16/16 1317  LIPASE 22   No results for input(s): AMMONIA in the last 168 hours. CBC:  Recent Labs Lab 04/16/16 1317 04/17/16 0151 04/18/16 0604 04/19/16 0547 04/20/16 0946  WBC 11.5* 10.8* 9.1 9.9 10.3  NEUTROABS 9.3*  --   --   --   --   HGB 13.4 12.3* 12.7* 13.6 13.4  HCT 39.2 36.1* 36.7* 40.4 39.0  MCV 84.8 84.0 84.0 84.9 84.6  PLT 197 191 203 264 262   Cardiac Enzymes:  Recent Labs Lab 04/17/16 0151 04/17/16 0843 04/17/16 1525  TROPONINI <0.03 <0.03 <0.03   BNP: Invalid input(s): POCBNP CBG: No results for input(s): GLUCAP in the last 168 hours. D-Dimer No results for input(s): DDIMER in the last 72 hours. Hgb A1c No results for input(s): HGBA1C in the last 72 hours. Lipid Profile No results for input(s): CHOL, HDL, LDLCALC, TRIG, CHOLHDL, LDLDIRECT in the last 72 hours. Thyroid function studies No results for input(s): TSH, T4TOTAL, T3FREE, THYROIDAB in the last 72 hours.  Invalid input(s): FREET3 Anemia work up No results for input(s): VITAMINB12, FOLATE, FERRITIN, TIBC, IRON, RETICCTPCT in the last 72 hours. Urinalysis    Component Value Date/Time   COLORURINE AMBER (A) 04/16/2016 1830   APPEARANCEUR CLEAR 04/16/2016 1830   LABSPEC >1.046 (H) 04/16/2016 1830   PHURINE 6.0 04/16/2016 1830   GLUCOSEU NEGATIVE 04/16/2016 1830   HGBUR NEGATIVE 04/16/2016 1830   BILIRUBINUR SMALL (A) 04/16/2016 1830   KETONESUR NEGATIVE 04/16/2016 1830   PROTEINUR 30 (A) 04/16/2016 1830   NITRITE NEGATIVE 04/16/2016 1830   LEUKOCYTESUR NEGATIVE 04/16/2016 1830   Sepsis Labs Invalid input(s): PROCALCITONIN,  WBC,  LACTICIDVEN Microbiology Recent Results (from the past 240 hour(s))  Urine culture     Status: Abnormal   Collection Time: 04/15/16  2:13 AM  Result Value Ref Range Status   Specimen Description URINE, CLEAN CATCH  Final   Special Requests NONE  Final   Culture (A)  Final    <10,000 COLONIES/mL INSIGNIFICANT  GROWTH Performed  at [facility]    Report Status 04/16/2016 FINAL  Final  Urine culture     Status: None   Collection Time: 04/16/16  6:30 PM  Result Value Ref Range Status   Specimen Description URINE, CLEAN CATCH  Final   Special Requests NONE  Final   Culture NO GROWTH Performed at [facility]   Final   Report Status 04/18/2016 FINAL  Final  MRSA PCR Screening     Status: None   Collection Time: 04/16/16 10:11 PM  Result Value Ref Range Status   MRSA by PCR NEGATIVE NEGATIVE Final    Comment:        The GeneXpert MRSA Assay (FDA approved for NASAL specimens only), is one component of a comprehensive MRSA colonization surveillance program. It is not intended to diagnose MRSA infection nor to guide or monitor treatment for MRSA infections.   Culture, blood (x 2)     Status: None (Preliminary result)   Collection Time: 04/16/16 10:13 PM  Result Value Ref Range Status   Specimen Description BLOOD LEFT HAND  Final   Special Requests BOTTLES DRAWN AEROBIC AND ANAEROBIC 6CC  Final   Culture   Final    NO GROWTH 4 DAYS Performed at [facility]    Report Status PENDING  Incomplete  Culture, blood (x 2)     Status: None (Preliminary result)   Collection Time: 04/16/16 10:13 PM  Result Value Ref Range Status   Specimen Description BLOOD LEFT ARM  Final   Special Requests IN PEDIATRIC BOTTLE 2CC  Final   Culture   Final    NO GROWTH 4 DAYS Performed at [facility]    Report Status PENDING  Incomplete   Time coordinating discharge: 32 minutes  SIGNED:  Standley Dakins, MD  [facility] 04/22/2016, 10:50 AM Pager   If 7PM-7AM, please contact night-coverage [email or web address] Password [email or web address]

## 2016-04-22 NOTE — Care Management Note (Addendum)
Case Management Note  Patient Details  Name: Daniel Oconnell MRN: [id number] Date of Birth: Apr 26, 1956  Subjective/Objective:                  hypoxia Action/Plan: Discharge planning Expected Discharge Date:  04/23/16               Expected Discharge Plan:  [facility]  In-House Referral:     Discharge planning Services  CM Consult  Post Acute Care Choice:  NA Choice offered to:  NA  DME Arranged:  N/A DME Agency:  NA  HH Arranged:  NA HH Agency:  NA  Status of Service:  Completed, signed off  If discussed at Long Length of Stay Meetings, dates discussed:    Additional Comments: CM notes pt returning to SNF; CSW aware and arranging.  NO other CM needs were communicated. Yves DillJeffries, Aidden Markovic Christine, RN 04/22/2016, 10:52 AM

## 2016-04-22 NOTE — Progress Notes (Addendum)
LCSWA met with patient and son Kyler at bedside. Patient will transport by car. RNCM ordered O2 equipment for patient to transport, will be set up at facility apartment. Updated D/C summary faxed to facility No other needs identified.  Nicole Sinclair, LCSWA, MSW Clinical Social Worker 5E and Psychiatric Service Line [phone] 04/22/2016  2:08 PM 

## 2016-04-22 NOTE — Progress Notes (Signed)
Pt discharge instructions reviewed with the pt. Pt currently waiting for oxygen to be delivered. No questions or concerns at this time  Joyce CopaAbby W Denitra Donaghey, RN

## 2016-04-22 NOTE — Progress Notes (Signed)
SATURATION QUALIFICATIONS: (This note is used to comply with regulatory documentation for home oxygen)  Patient Saturations on Room Air at Rest = 90%  Patient Saturations on Room Air while Ambulating = 87%  Patient Saturations on 1 Liters of oxygen while Ambulating = 91%  Please briefly explain why patient needs home oxygen: pt qualifies for oxygen.

## 2016-04-23 LAB — CULTURE, BLOOD (ROUTINE X 2)
Culture: NO GROWTH
Culture: NO GROWTH

## 2016-05-12 ENCOUNTER — Ambulatory Visit: Payer: Medicare HMO | Admitting: Cardiology

## 2016-05-12 NOTE — Progress Notes (Deleted)
Cardiology Office Note   Date:  05/12/2016   ID:  Daniel Oconnell, DOB 02/25/1956, MRN [id number]  PCP:  No primary care provider on file.  Cardiologist:   Rollene RotundaJames Mikie Misner, MD  Referring:  ***  No chief complaint on file.     History of Present Illness: Daniel Oconnell is a 60 y.o. male who presents for ***  The patient has multiple medical problems. He was recently hospitalized with respiratory failure thought to be secondary to pneumonia. During his hospitalization cardiac enzymes were negative.  Past Medical History:  Diagnosis Date  . 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 CAD (coronary artery disease) 2001   CABG  . COPD (chronic obstructive pulmonary disease) (HCC)   . CVA (cerebral vascular accident) (HCC) 2001, 2013   x2  . Expressive aphasia   . 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 Hypertension     Past Surgical History:  Procedure Laterality Date  . APPENDECTOMY    . CORONARY ARTERY BYPASS GRAFT  2001  . LUMBAR SPINE SURGERY       Current Outpatient Prescriptions  Medication Sig Dispense Refill  . acetaminophen (TYLENOL) 500 MG tablet Take 500 mg by mouth every 4 (four) hours as needed for mild pain, moderate pain, fever or headache.     . albuterol (PROVENTIL) (2.5 MG/3ML) 0.083% nebulizer solution Take 3 mLs (2.5 mg total) by nebulization every 4 (four) hours as needed for wheezing or shortness of breath. 75 mL 0  . alum & mag hydroxide-simeth (MINTOX) 200-200-20 MG/5ML suspension Take 30 mLs by mouth as needed for indigestion or heartburn.    Marland Kitchen. amLODipine (NORVASC) 10 MG tablet Take 10 mg by mouth at bedtime.     Marland Kitchen. aspirin 325 MG EC tablet Take 325 mg by mouth daily with breakfast.     . atorvastatin (LIPITOR) 80 MG tablet Take 80 mg by mouth at bedtime.     . baclofen (LIORESAL) 20 MG tablet Take 20 mg by mouth 3 (three) times daily.    . budesonide (PULMICORT) 0.25 MG/2ML nebulizer solution Take 2 mLs (0.25 mg total)  by nebulization 2 (two) times daily. 60 mL 0  . cetirizine (ZYRTEC) 10 MG tablet Take 10 mg by mouth daily with breakfast.     . Cholecalciferol (VITAMIN D) 2000 units tablet Take 2,000 Units by mouth daily with breakfast.    . clopidogrel (PLAVIX) 75 MG tablet Take 75 mg by mouth daily with breakfast.     . dicyclomine (BENTYL) 20 MG tablet Take 1 tablet (20 mg total) by mouth 2 (two) times daily. Take for abdominal pain/cramping 20 tablet 0  . guaifenesin (ROBAFEN) 100 MG/5ML syrup Take 200 mg by mouth every 6 (six) hours as needed for cough.    . hydrALAZINE (APRESOLINE) 100 MG tablet Take 100 mg by mouth 2 (two) times daily.    . hydrOXYzine (ATARAX/VISTARIL) 25 MG tablet Take 25 mg by mouth every 6 (six) hours as needed for itching.     . labetalol (NORMODYNE) 200 MG tablet Take 200 mg by mouth 2 (two) times daily.    Marland Kitchen. lactobacillus acidophilus & bulgar (LACTINEX) chewable tablet Chew 1 tablet by mouth daily with breakfast.     . levETIRAcetam (KEPPRA) 750 MG tablet Take 750 mg by mouth 2 (two) times daily.    Marland Kitchen. loperamide (IMODIUM) 2 MG capsule Take by mouth as needed for diarrhea or loose stools.    .Marland Kitchen  losartan (COZAAR) 50 MG tablet Take 50 mg by mouth daily with breakfast.    . magnesium hydroxide (MILK OF MAGNESIA) 400 MG/5ML suspension Take 30 mLs by mouth at bedtime as needed for mild constipation.    Marland Kitchen neomycin-bacitracin-polymyxin (NEOSPORIN) ointment Apply 1 application topically as needed for wound care.    . ondansetron (ZOFRAN ODT) 4 MG disintegrating tablet Take 1 tablet (4 mg total) by mouth every 8 (eight) hours as needed for nausea or vomiting. 10 tablet 0  . polyethylene glycol powder (GLYCOLAX/MIRALAX) powder Take 17 g by mouth 2 (two) times daily. Use for constipation, as prescribed, until daily soft stools  OTC 119 g 0  . sertraline (ZOLOFT) 100 MG tablet Take 100 mg by mouth daily with breakfast.     . tamsulosin (FLOMAX) 0.4 MG CAPS capsule Take 1 capsule (0.4 mg total)  by mouth daily. 30 capsule 0  . tiotropium (SPIRIVA) 18 MCG inhalation capsule Place 18 mcg into inhaler and inhale daily.    . vitamin B-12 (CYANOCOBALAMIN) 1000 MCG tablet Take 1,000 mcg by mouth daily with breakfast.     . zolpidem (AMBIEN CR) 12.5 MG CR tablet Take 12.5 mg by mouth at bedtime.      No current facility-administered medications for this visit.     Allergies:   Sulfa antibiotics    Social History:  The patient  reports that he quit smoking about 4 years ago. He has never used smokeless tobacco. He reports that he does not drink alcohol or use drugs.   Family History:  The patient's ***family history includes CVA (age of onset: 5) in his mother.    ROS:  Please see the history of present illness.   Otherwise, review of systems are positive for {NONE DEFAULTED:18576::"none"}.   All other systems are reviewed and negative.    PHYSICAL EXAM: VS:  There were no vitals taken for this visit. , BMI There is no height or weight on file to calculate BMI. GENERAL:  Well appearing HEENT:  Pupils equal round and reactive, fundi not visualized, oral mucosa unremarkable NECK:  No jugular venous distention, waveform within normal limits, carotid upstroke brisk and symmetric, no bruits, no thyromegaly LYMPHATICS:  No cervical, inguinal adenopathy LUNGS:  Clear to auscultation bilaterally BACK:  No CVA tenderness CHEST:  Unremarkable HEART:  PMI not displaced or sustained,S1 and S2 within normal limits, no S3, no S4, no clicks, no rubs, *** murmurs ABD:  Flat, positive bowel sounds normal in frequency in pitch, no bruits, no rebound, no guarding, no midline pulsatile mass, no hepatomegaly, no splenomegaly EXT:  2 plus pulses throughout, no edema, no cyanosis no clubbing SKIN:  No rashes no nodules NEURO:  Cranial nerves II through XII grossly intact, motor grossly intact throughout PSYCH:  Cognitively intact, oriented to person place and time    EKG:  EKG {ACTION; IS/IS  ZOX:09604540} ordered today. The ekg ordered today demonstrates ***   Recent Labs: 04/16/2016: ALT 17; B Natriuretic Peptide 441.4 04/17/2016: Magnesium 1.9 04/20/2016: Hemoglobin 13.4; Platelets 262 04/22/2016: BUN 20; Creatinine, Ser 1.00; Potassium 3.6; Sodium 137    Lipid Panel No results found for: CHOL, TRIG, HDL, CHOLHDL, VLDL, LDLCALC, LDLDIRECT    Wt Readings from Last 3 Encounters:  04/16/16 197 lb 5 oz (89.5 kg)  04/14/16 180 lb (81.6 kg)      Other studies Reviewed: Additional studies/ records that were reviewed today include: ***. Review of the above records demonstrates:  Please see elsewhere in the  note.  ***   ASSESSMENT AND PLAN:  CAD:  ***   Current medicines are reviewed at length with the patient today.  The patient {ACTIONS; HAS/DOES NOT HAVE:19233} concerns regarding medicines.  The following changes have been made:  {PLAN; NO CHANGE:13088:s}  Labs/ tests ordered today include: *** No orders of the defined types were placed in this encounter.    Disposition:   FU with ***    Signed, Rollene RotundaJames Kitt Ledet, MD  05/12/2016 1:34 PM    [facility]

## 2016-05-20 NOTE — Progress Notes (Signed)
Cardiology Office Note   Date:  05/22/2016   ID:  Daniel Oconnell, DOB 1956-05-04, MRN [id number]  PCP:  No PCP Per Patient  Cardiologist:   Rollene Rotunda, MD  Referring:  Dr. Janee Morn  Chief Complaint  Patient presents with  . Coronary Artery Disease      History of Present Illness: Daniel Oconnell is a 61 y.o. male who presents for follow-up of coronary disease. He had a history of bypass surgery in 2001 at [facility].   I don't have those records.  He did have a stroke in 2001 as well.  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 He had carotid stenting. He does report having bypass surgery. He doesn't report ever having symptoms.  The patient has multiple medical problems. He was recently hospitalized with respiratory failure thought to be secondary to pneumonia. During his hospitalization cardiac enzymes were negative.  He lives at an assisted living facility. He walks to the dining hall. He does have expressive aphasia as well as his hemiparesis but he does relatively well.   Past Medical History:  Diagnosis Date  . 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 CAD (coronary artery disease) 2001   CABG  . COPD (chronic obstructive pulmonary disease) (HCC)   . CVA (cerebral vascular accident) (HCC) 2001, 2013   x2  . Expressive aphasia   . 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 Hyperlipidemia   . Hypertension     Past Surgical History:  Procedure Laterality Date  . APPENDECTOMY    . CORONARY ARTERY BYPASS GRAFT  2001  . LUMBAR SPINE SURGERY       Current Outpatient Prescriptions  Medication Sig Dispense Refill  . acetaminophen (TYLENOL) 500 MG tablet Take 500 mg by mouth every 4 (four) hours as needed for mild pain, moderate pain, fever or headache.     . albuterol (PROVENTIL) (2.5 MG/3ML) 0.083% nebulizer solution Take 3 mLs (2.5 mg total) by nebulization every 4 (four) hours as needed for wheezing or shortness of breath. 75  mL 0  . alum & mag hydroxide-simeth (MINTOX) 200-200-20 MG/5ML suspension Take 30 mLs by mouth as needed for indigestion or heartburn.    Marland Kitchen amLODipine (NORVASC) 10 MG tablet Take 10 mg by mouth at bedtime.     Marland Kitchen aspirin 325 MG EC tablet Take 325 mg by mouth daily with breakfast.     . atorvastatin (LIPITOR) 80 MG tablet Take 80 mg by mouth at bedtime.     . baclofen (LIORESAL) 20 MG tablet Take 20 mg by mouth 3 (three) times daily.    . budesonide (PULMICORT) 0.25 MG/2ML nebulizer solution Take 2 mLs (0.25 mg total) by nebulization 2 (two) times daily. 60 mL 0  . cetirizine (ZYRTEC) 10 MG tablet Take 10 mg by mouth daily with breakfast.     . Cholecalciferol (VITAMIN D) 2000 units tablet Take 2,000 Units by mouth daily with breakfast.    . clopidogrel (PLAVIX) 75 MG tablet Take 75 mg by mouth daily with breakfast.     . dicyclomine (BENTYL) 20 MG tablet Take 1 tablet (20 mg total) by mouth 2 (two) times daily. Take for abdominal pain/cramping 20 tablet 0  . guaifenesin (ROBAFEN) 100 MG/5ML syrup Take 200 mg by mouth every 6 (six) hours as needed for cough.    . hydrALAZINE (APRESOLINE) 100 MG tablet Take 100 mg by mouth 2 (two) times daily.    Marland Kitchen  hydrOXYzine (ATARAX/VISTARIL) 25 MG tablet Take 25 mg by mouth every 6 (six) hours as needed for itching.     . labetalol (NORMODYNE) 200 MG tablet Take 200 mg by mouth 2 (two) times daily.    Marland Kitchen. lactobacillus acidophilus & bulgar (LACTINEX) chewable tablet Chew 1 tablet by mouth daily with breakfast.     . levETIRAcetam (KEPPRA) 750 MG tablet Take 750 mg by mouth 2 (two) times daily.    Marland Kitchen. loperamide (IMODIUM) 2 MG capsule Take by mouth as needed for diarrhea or loose stools.    Marland Kitchen. losartan (COZAAR) 50 MG tablet Take 50 mg by mouth daily with breakfast.    . magnesium hydroxide (MILK OF MAGNESIA) 400 MG/5ML suspension Take 30 mLs by mouth at bedtime as needed for mild constipation.    Marland Kitchen. neomycin-bacitracin-polymyxin (NEOSPORIN) ointment Apply 1 application  topically as needed for wound care.    . ondansetron (ZOFRAN ODT) 4 MG disintegrating tablet Take 1 tablet (4 mg total) by mouth every 8 (eight) hours as needed for nausea or vomiting. 10 tablet 0  . polyethylene glycol powder (GLYCOLAX/MIRALAX) powder Take 17 g by mouth 2 (two) times daily. Use for constipation, as prescribed, until daily soft stools  OTC 119 g 0  . sertraline (ZOLOFT) 100 MG tablet Take 100 mg by mouth daily with breakfast.     . tamsulosin (FLOMAX) 0.4 MG CAPS capsule Take 1 capsule (0.4 mg total) by mouth daily. 30 capsule 0  . tiotropium (SPIRIVA) 18 MCG inhalation capsule Place 18 mcg into inhaler and inhale daily.    . vitamin B-12 (CYANOCOBALAMIN) 1000 MCG tablet Take 1,000 mcg by mouth daily with breakfast.     . zolpidem (AMBIEN CR) 12.5 MG CR tablet Take 12.5 mg by mouth at bedtime.      No current facility-administered medications for this visit.     Allergies:   Sulfa antibiotics    Social History:  The patient  reports that he quit smoking about 5 years ago. He has never used smokeless tobacco. He reports that he does not drink alcohol or use drugs.   Family History:  The patient's family history includes Aneurysm in his father; CVA (age of onset: 2780) in his mother.    ROS:  Please see the history of present illness.   Otherwise, review of systems are positive for he fell on his right wrist and it is swollen.   All other systems are reviewed and negative.    PHYSICAL EXAM: VS:  BP 107/67 (BP Location: Left Arm, Patient Position: Sitting, Cuff Size: Normal)   Pulse 64  , BMI There is no height or weight on file to calculate BMI. GENERAL:  Well appearing HEENT:  Pupils equal round and reactive, fundi not visualized, oral mucosa unremarkable NECK:  No jugular venous distention, waveform within normal limits, carotid upstroke brisk and symmetric, no bruits, no thyromegaly LYMPHATICS:  No cervical, inguinal adenopathy LUNGS:  Clear to auscultation  bilaterally BACK:  No CVA tenderness CHEST:  Unremarkable HEART:  PMI not displaced or sustained,S1 and S2 within normal limits, no S3, no S4, no clicks, no rubs, no murmurs ABD:  Flat, positive bowel sounds normal in frequency in pitch, no bruits, no rebound, no guarding, no midline pulsatile mass, no hepatomegaly, no splenomegaly EXT:  2 plus pulses throughout, no edema, no cyanosis no clubbing, right wrist swelling SKIN:  No rashes no nodules NEURO:  Right hemiparesis with expressive aphasia PSYCH:  Cognitively intact, oriented to person place  and time    EKG:  EKG is not ordered today. The ekg ordered 11/20/17demonstrates sinus rhythm, rate 60, axis within normal limits, intervals within normal limits, diffuse T-wave inversions without old EKGs for comparison. As his most evident in the anterior and lateral leads.   Recent Labs: 04/16/2016: ALT 17; B Natriuretic Peptide 441.4 04/17/2016: Magnesium 1.9 04/20/2016: Hemoglobin 13.4; Platelets 262 04/22/2016: BUN 20; Creatinine, Ser 1.00; Potassium 3.6; Sodium 137    Lipid Panel No results found for: CHOL, TRIG, HDL, CHOLHDL, VLDL, LDLCALC, LDLDIRECT    Wt Readings from Last 3 Encounters:  04/16/16 197 lb 5 oz (89.5 kg)  04/14/16 180 lb (81.6 kg)      Other studies Reviewed: Additional studies/ records that were reviewed today include: Hospital records. Review of the above records demonstrates:  Please see elsewhere in the note.     ASSESSMENT AND PLAN:  CAD:  The patient has no symptoms. Given the fact that he had no cardiac enzyme elevation at the time of his recent significant hospitalization and illness I don't think that any further cardiovascular testing is indicated. He'll continue with secondary risk reduction.  HTN:  The blood pressure is at target. No change in medications is indicated. We will continue with therapeutic lifestyle changes (TLC).  DYSLIPIDEMIA:  I would like for him to have a lipid profile fasting.   He is on target dose of statin.  ABNORMAL EKG:  I like to see an old EKG for comparison and might suggest an echocardiogram.   Current medicines are reviewed at length with the patient today.  The patient does not have concerns regarding medicines.  The following changes have been made:  no change  Labs/ tests ordered today include: None No orders of the defined types were placed in this encounter.    Disposition:   FU with six months.     Signed, Rollene Rotunda, MD  05/22/2016 7:44 AM    [facility]

## 2016-05-21 ENCOUNTER — Ambulatory Visit (INDEPENDENT_AMBULATORY_CARE_PROVIDER_SITE_OTHER): Payer: Medicare HMO

## 2016-05-21 ENCOUNTER — Ambulatory Visit (INDEPENDENT_AMBULATORY_CARE_PROVIDER_SITE_OTHER): Payer: Medicare HMO | Admitting: Cardiology

## 2016-05-21 ENCOUNTER — Encounter (HOSPITAL_COMMUNITY): Payer: Self-pay | Admitting: Emergency Medicine

## 2016-05-21 ENCOUNTER — Encounter: Payer: Self-pay | Admitting: Cardiology

## 2016-05-21 ENCOUNTER — Ambulatory Visit (HOSPITAL_COMMUNITY)
Admission: EM | Admit: 2016-05-21 | Discharge: 2016-05-21 | Disposition: A | Discharge status: Home / Self Care | Payer: Medicare HMO | Attending: Emergency Medicine | Admitting: Emergency Medicine

## 2016-05-21 ENCOUNTER — Ambulatory Visit: Payer: Medicare HMO | Admitting: Cardiology

## 2016-05-21 VITALS — BP 107/67 | HR 64

## 2016-05-21 DIAGNOSIS — S62339A Displaced fracture of neck of unspecified metacarpal bone, initial encounter for closed fracture: Secondary | ICD-10-CM

## 2016-05-21 DIAGNOSIS — E785 Hyperlipidemia, unspecified: Secondary | ICD-10-CM | POA: Diagnosis not present

## 2016-05-21 DIAGNOSIS — I2510 Atherosclerotic heart disease of native coronary artery without angina pectoris: Secondary | ICD-10-CM

## 2016-05-21 NOTE — ED Triage Notes (Signed)
Pt reports he fell 2 nights ago while going to the bathroom and inj his right hand  Sx include: swelling but does not have pain due to hx of stroke (affected right side) and can't feel much  Denies head inj/LOC  Also c/o rash on BLE onset 1 week.   A&O x4... NAD

## 2016-05-21 NOTE — ED Provider Notes (Signed)
[facility]    CSN: [id number] Arrival date & time: 05/21/16  1528     History   Chief Complaint Chief Complaint  Patient presents with  . Fall    HPI Daniel Oconnell is a 61 y.o. male.   HPI  He is a 61 year old man here for evaluation of right hand swelling after a fall. He is accompanied by his ex-wife who provides the history. 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 About 2 days ago, he fell going to the bathroom, landing on his right hand. He has some contractures of the hand as well as decreased sensation from prior stroke. He has had persistent swelling and bruising. The staff at [facility], where he lives, are concerned for fracture. He denies any pain. He is able to straighten his fingers, but has contractures of the fourth and fifth digits.  He also has had an itchy rash on his bilateral lower legs for several days.  Past Medical History:  Diagnosis Date  . 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 CAD (coronary artery disease) 2001   CABG  . COPD (chronic obstructive pulmonary disease) (HCC)   . CVA (cerebral vascular accident) (HCC) 2001, 2013   x2  . Expressive aphasia   . 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 Hyperlipidemia   . Hypertension     Patient Active Problem List   Diagnosis Date Noted  . Adrenal abnormality (HCC)   . Expressive aphasia   . Acute respiratory failure with hypoxia (HCC) 04/17/2016  . Abdominal pain, acute, right lower quadrant 04/17/2016  . Nonspecific abnormal electrocardiogram (ECG) (EKG) 04/17/2016  . H/O: CVA (cerebrovascular accident) 04/17/2016  . COPD (chronic obstructive pulmonary disease) (HCC) 04/17/2016  . CAD (coronary artery disease) 04/17/2016  . Essential hypertension 04/17/2016  . Right lower quadrant abdominal pain   . HCAP (healthcare-associated pneumonia) 04/16/2016    Past Surgical History:  Procedure Laterality Date  . APPENDECTOMY    . CORONARY  ARTERY BYPASS GRAFT  2001  . LUMBAR SPINE SURGERY         Home Medications    Prior to Admission medications   Medication Sig Start Date End Date Taking? Authorizing Provider  amLODipine (NORVASC) 10 MG tablet Take 10 mg by mouth at bedtime.    Yes Historical Provider, MD  aspirin 325 MG EC tablet Take 325 mg by mouth daily with breakfast.    Yes Historical Provider, MD  atorvastatin (LIPITOR) 80 MG tablet Take 80 mg by mouth at bedtime.    Yes Historical Provider, MD  baclofen (LIORESAL) 20 MG tablet Take 20 mg by mouth 3 (three) times daily.   Yes Historical Provider, MD  budesonide (PULMICORT) 0.25 MG/2ML nebulizer solution Take 2 mLs (0.25 mg total) by nebulization 2 (two) times daily. 04/22/16  Yes Clanford Cyndie Mull, MD  cetirizine (ZYRTEC) 10 MG tablet Take 10 mg by mouth daily with breakfast.    Yes Historical Provider, MD  Cholecalciferol (VITAMIN D) 2000 units tablet Take 2,000 Units by mouth daily with breakfast.   Yes Historical Provider, MD  clopidogrel (PLAVIX) 75 MG tablet Take 75 mg by mouth daily with breakfast.    Yes Historical Provider, MD  dicyclomine (BENTYL) 20 MG tablet Take 1 tablet (20 mg total) by mouth 2 (two) times daily. Take for abdominal pain/cramping 04/15/16  Yes Antony Madura, PA-C  hydrALAZINE (APRESOLINE) 100 MG tablet Take 100 mg by mouth 2 (two) times daily.  Yes Historical Provider, MD  labetalol (NORMODYNE) 200 MG tablet Take 200 mg by mouth 2 (two) times daily.   Yes Historical Provider, MD  lactobacillus acidophilus & bulgar (LACTINEX) chewable tablet Chew 1 tablet by mouth daily with breakfast.    Yes Historical Provider, MD  levETIRAcetam (KEPPRA) 750 MG tablet Take 750 mg by mouth 2 (two) times daily.   Yes Historical Provider, MD  losartan (COZAAR) 50 MG tablet Take 50 mg by mouth daily with breakfast.   Yes Historical Provider, MD  polyethylene glycol powder (GLYCOLAX/MIRALAX) powder Take 17 g by mouth 2 (two) times daily. Use for constipation,  as prescribed, until daily soft stools  OTC 04/15/16  Yes Antony MaduraKelly Humes, PA-C  sertraline (ZOLOFT) 100 MG tablet Take 100 mg by mouth daily with breakfast.    Yes Historical Provider, MD  tamsulosin (FLOMAX) 0.4 MG CAPS capsule Take 1 capsule (0.4 mg total) by mouth daily. 04/22/16  Yes Clanford Cyndie MullL Johnson, MD  acetaminophen (TYLENOL) 500 MG tablet Take 500 mg by mouth every 4 (four) hours as needed for mild pain, moderate pain, fever or headache.     Historical Provider, MD  albuterol (PROVENTIL) (2.5 MG/3ML) 0.083% nebulizer solution Take 3 mLs (2.5 mg total) by nebulization every 4 (four) hours as needed for wheezing or shortness of breath. 04/22/16   Clanford Cyndie MullL Johnson, MD  alum & mag hydroxide-simeth (MINTOX) 200-200-20 MG/5ML suspension Take 30 mLs by mouth as needed for indigestion or heartburn.    Historical Provider, MD  guaifenesin (ROBAFEN) 100 MG/5ML syrup Take 200 mg by mouth every 6 (six) hours as needed for cough.    Historical Provider, MD  hydrOXYzine (ATARAX/VISTARIL) 25 MG tablet Take 25 mg by mouth every 6 (six) hours as needed for itching.     Historical Provider, MD  loperamide (IMODIUM) 2 MG capsule Take by mouth as needed for diarrhea or loose stools.    Historical Provider, MD  magnesium hydroxide (MILK OF MAGNESIA) 400 MG/5ML suspension Take 30 mLs by mouth at bedtime as needed for mild constipation.    Historical Provider, MD  neomycin-bacitracin-polymyxin (NEOSPORIN) ointment Apply 1 application topically as needed for wound care.    Historical Provider, MD  ondansetron (ZOFRAN ODT) 4 MG disintegrating tablet Take 1 tablet (4 mg total) by mouth every 8 (eight) hours as needed for nausea or vomiting. 04/15/16   Antony MaduraKelly Humes, PA-C  tiotropium (SPIRIVA) 18 MCG inhalation capsule Place 18 mcg into inhaler and inhale daily.    Historical Provider, MD  vitamin B-12 (CYANOCOBALAMIN) 1000 MCG tablet Take 1,000 mcg by mouth daily with breakfast.     Historical Provider, MD  zolpidem  (AMBIEN CR) 12.5 MG CR tablet Take 12.5 mg by mouth at bedtime.     Historical Provider, MD    Family History Family History  Problem Relation Age of Onset  . CVA Mother 6680  . Aneurysm Father     Social History Social History  Substance Use Topics  . Smoking status: Former Smoker    Quit date: 2013  . Smokeless tobacco: Never Used  . Alcohol use No     Comment: h/o heavy ETOH use     Allergies   Sulfa antibiotics   Review of Systems Review of Systems As in history of present illness  Physical Exam Triage Vital Signs ED Triage Vitals [05/21/16 1549]  Enc Vitals Group     BP 124/72     Pulse Rate 62     Resp 22  Temp 97.7 F (36.5 C)     Temp Source Oral     SpO2 95 %     Weight      Height      Head Circumference      Peak Flow      Pain Score      Pain Loc      Pain Edu?      Excl. in GC?    No data found.   Updated Vital Signs BP 124/72 (BP Location: Left Arm)   Pulse 62   Temp 97.7 F (36.5 C) (Oral)   Resp 22   SpO2 95%   Visual Acuity Right Eye Distance:   Left Eye Distance:   Bilateral Distance:    Right Eye Near:   Left Eye Near:    Bilateral Near:     Physical Exam  Constitutional: He appears well-developed and well-nourished. No distress.  Cardiovascular: Normal rate.   Pulmonary/Chest: Effort normal.  Musculoskeletal:  Right hand: 2+ radial pulse. He does have some contractures of the fourth and fifth digits. I am able to fully extend his fingers. He has significant swelling and bruising over the ulnar aspect of the hand, both dorsally and palmar. No tenderness, but he does have decreased sensation in his hand.  Neurological: He is alert.  Skin:  Erythematous rash in bilateral lower legs. It is dry and scaly appearing. He does have some petechial lesions, but these are linear distribution, consistent with excoriation.     UC Treatments / Results  Labs (all labs ordered are listed, but only abnormal results are  displayed) Labs Reviewed - No data to display  EKG  EKG Interpretation None       Radiology Dg Hand Complete Right  Result Date: 05/21/2016 CLINICAL DATA:  Fall. EXAM: RIGHT HAND - COMPLETE 3+ VIEW COMPARISON:  No recent prior. FINDINGS: Angulated fracture of the distal right fourth and fifth metacarpals. Diffuse osteopenia degenerative change. Surgical clips right wrist. IMPRESSION: Angulated fractures of the distal right fourth and fifth metacarpals. Electronically Signed   By: Maisie Fus  Register   On: 05/21/2016 16:12    Procedures Procedures (including critical care time)  Medications Ordered in UC Medications - No data to display   Initial Impression / Assessment and Plan / UC Course  I have reviewed the triage vital signs and the nursing notes.  Pertinent labs & imaging results that were available during my care of the patient were reviewed by me and considered in my medical decision making (see chart for details).  Clinical Course     He has a brace at home that he wears for his contractures. This holds his fourth and fifth digits in the neutral position. Given that he does not use that hand for fine motor skills, will have him wear his current brace all day for the next 6 weeks. Okay to remove it to shower. Discussed with patient and his ex-wife who accompanies him today, they agree that an orthopedic referral is unnecessary.  Final Clinical Impressions(s) / UC Diagnoses   Final diagnoses:  Closed boxer's fracture, initial encounter    New Prescriptions New Prescriptions   No medications on file     Charm Rings, MD 05/21/16 1630

## 2016-05-21 NOTE — Patient Instructions (Signed)
Medication Instructions:  Continue current medications  Labwork: None Ordered  Testing/Procedures: None Ordered  Follow-Up: Your physician wants you to follow-up in: 6 Months. You will receive a reminder letter in the mail two months in advance. If you don't receive a letter, please call our office to schedule the follow-up appointment.   Any Other Special Instructions Will Be Listed Below (If Applicable).  [facility] [address]   If you need a refill on your cardiac medications before your next appointment, please call your pharmacy.

## 2016-05-21 NOTE — Discharge Instructions (Signed)
You have 2 broken bones in your hand, the fourth metacarpal and the fifth metacarpal. Wear the brace you have at home all the time for the next 6 weeks. You may remove it to shower. Follow-up in 6 weeks for repeat x-ray to make sure it has healed. Please increase your calcium intake to help strengthen your bones. I recommend 2-3 glasses of milk a day.

## 2016-05-22 ENCOUNTER — Emergency Department (HOSPITAL_COMMUNITY): Payer: Medicare HMO

## 2016-05-22 ENCOUNTER — Encounter: Payer: Self-pay | Admitting: Cardiology

## 2016-05-22 ENCOUNTER — Emergency Department (HOSPITAL_COMMUNITY)
Admission: EM | Admit: 2016-05-22 | Discharge: 2016-05-22 | Disposition: A | Discharge status: Home / Self Care | Payer: Medicare HMO | Attending: Emergency Medicine | Admitting: Emergency Medicine

## 2016-05-22 ENCOUNTER — Encounter (HOSPITAL_COMMUNITY): Payer: Self-pay | Admitting: Emergency Medicine

## 2016-05-22 DIAGNOSIS — Z79899 Other long term (current) drug therapy: Secondary | ICD-10-CM | POA: Insufficient documentation

## 2016-05-22 DIAGNOSIS — Z951 Presence of aortocoronary bypass graft: Secondary | ICD-10-CM | POA: Diagnosis not present

## 2016-05-22 DIAGNOSIS — Y929 Unspecified place or not applicable: Secondary | ICD-10-CM | POA: Diagnosis not present

## 2016-05-22 DIAGNOSIS — Z87891 Personal history of nicotine dependence: Secondary | ICD-10-CM | POA: Diagnosis not present

## 2016-05-22 DIAGNOSIS — W010XXA Fall on same level from slipping, tripping and stumbling without subsequent striking against object, initial encounter: Secondary | ICD-10-CM | POA: Diagnosis not present

## 2016-05-22 DIAGNOSIS — Z8673 Personal history of transient ischemic attack (TIA), and cerebral infarction without residual deficits: Secondary | ICD-10-CM | POA: Diagnosis not present

## 2016-05-22 DIAGNOSIS — I10 Essential (primary) hypertension: Secondary | ICD-10-CM | POA: Diagnosis not present

## 2016-05-22 DIAGNOSIS — Y999 Unspecified external cause status: Secondary | ICD-10-CM | POA: Diagnosis not present

## 2016-05-22 DIAGNOSIS — Z7982 Long term (current) use of aspirin: Secondary | ICD-10-CM | POA: Diagnosis not present

## 2016-05-22 DIAGNOSIS — Y939 Activity, unspecified: Secondary | ICD-10-CM | POA: Insufficient documentation

## 2016-05-22 DIAGNOSIS — S62336D Displaced fracture of neck of fifth metacarpal bone, right hand, subsequent encounter for fracture with routine healing: Secondary | ICD-10-CM | POA: Insufficient documentation

## 2016-05-22 DIAGNOSIS — S62335D Displaced fracture of neck of fourth metacarpal bone, left hand, subsequent encounter for fracture with routine healing: Secondary | ICD-10-CM | POA: Diagnosis not present

## 2016-05-22 DIAGNOSIS — S62328D Displaced fracture of shaft of other metacarpal bone, subsequent encounter for fracture with routine healing: Secondary | ICD-10-CM

## 2016-05-22 DIAGNOSIS — I2510 Atherosclerotic heart disease of native coronary artery without angina pectoris: Secondary | ICD-10-CM | POA: Diagnosis not present

## 2016-05-22 DIAGNOSIS — J449 Chronic obstructive pulmonary disease, unspecified: Secondary | ICD-10-CM | POA: Insufficient documentation

## 2016-05-22 NOTE — ED Notes (Signed)
Patient transported to X-ray 

## 2016-05-22 NOTE — Discharge Instructions (Signed)
Wear your brace at all times for the next several weeks You have been given a referral to a hand surgeon for further evaluation Keep you hand elevated as much as possible

## 2016-05-22 NOTE — ED Provider Notes (Signed)
[facility] Note   CSN: [id number] Arrival date & time: 05/22/16  0117  By signing my name below, I, [name], attest that this documentation has been prepared under the direction and in the presence of Daniel Favor, NP-C. Electronically Signed: [name] , ED Scribe. 05/22/16. 3:04 AM.    History   Chief Complaint Chief Complaint  Patient presents with  . Hand Pain    HPI  HPI Comments: Daniel Daniel Oconnell is a 61 y.o. male who presents to the Emergency Department s/p mechanical fall complaining of moderate R hand pain with sudden onset x2 days. Pt states that x2 days ago he fell and attempted to brace himself on the R hand. Since the fall the area has been painful. He denies any other complaints. WAs seen at [facility] yesterday and has know angulated fractures of 4,5th metatarsals .  The patient has a hand brace.  Status post stroke with little use it was deemed that he wear the brace 24 hours a day for the next several weeks and follow-up with hand surgery as needed.  No immediate need for surgery is identified.  The history is provided by the patient. No language interpreter was used.    Past Medical History:  Diagnosis Date  . 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 CAD (coronary artery disease) 2001   CABG  . COPD (chronic obstructive pulmonary disease) (HCC)   . CVA (cerebral vascular accident) (HCC) 2001, 2013   x2  . Expressive aphasia   . 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 Hyperlipidemia   . Hypertension     Patient Active Problem List   Diagnosis Date Noted  . Adrenal abnormality (HCC)   . Expressive aphasia   . Acute respiratory failure with hypoxia (HCC) 04/17/2016  . Abdominal pain, acute, right lower quadrant 04/17/2016  . Nonspecific abnormal electrocardiogram (ECG) (EKG) 04/17/2016  . H/O: CVA (cerebrovascular accident) 04/17/2016  . COPD (chronic obstructive pulmonary disease) (HCC) 04/17/2016    . CAD (coronary artery disease) 04/17/2016  . Essential hypertension 04/17/2016  . Right lower quadrant abdominal pain   . HCAP (healthcare-associated pneumonia) 04/16/2016    Past Surgical History:  Procedure Laterality Date  . APPENDECTOMY    . CORONARY ARTERY BYPASS GRAFT  2001  . LUMBAR SPINE SURGERY         Home Medications    Prior to Admission medications   Medication Sig Start Date End Date Taking? Authorizing Provider  acetaminophen (TYLENOL) 500 MG tablet Take 500 mg by mouth every 4 (four) hours as needed for mild pain, moderate pain, fever or headache.     Historical Provider, MD  albuterol (PROVENTIL) (2.5 MG/3ML) 0.083% nebulizer solution Take 3 mLs (2.5 mg total) by nebulization every 4 (four) hours as needed for wheezing or shortness of breath. 04/22/16   Clanford Cyndie Mull, MD  alum & mag hydroxide-simeth (MINTOX) 200-200-20 MG/5ML suspension Take 30 mLs by mouth as needed for indigestion or heartburn.    Historical Provider, MD  amLODipine (NORVASC) 10 MG tablet Take 10 mg by mouth at bedtime.     Historical Provider, MD  aspirin 325 MG EC tablet Take 325 mg by mouth daily with breakfast.     Historical Provider, MD  atorvastatin (LIPITOR) 80 MG tablet Take 80 mg by mouth at bedtime.     Historical Provider, MD  baclofen (LIORESAL) 20 MG tablet Take 20 mg by mouth 3 (three) times daily.  Historical Provider, MD  budesonide (PULMICORT) 0.25 MG/2ML nebulizer solution Take 2 mLs (0.25 mg total) by nebulization 2 (two) times daily. 04/22/16   Clanford Cyndie Mull, MD  cetirizine (ZYRTEC) 10 MG tablet Take 10 mg by mouth daily with breakfast.     Historical Provider, MD  Cholecalciferol (VITAMIN D) 2000 units tablet Take 2,000 Units by mouth daily with breakfast.    Historical Provider, MD  clopidogrel (PLAVIX) 75 MG tablet Take 75 mg by mouth daily with breakfast.     Historical Provider, MD  dicyclomine (BENTYL) 20 MG tablet Take 1 tablet (20 mg total) by mouth 2 (two)  times daily. Take for abdominal pain/cramping 04/15/16   Antony Madura, PA-C  guaifenesin (ROBAFEN) 100 MG/5ML syrup Take 200 mg by mouth every 6 (six) hours as needed for cough.    Historical Provider, MD  hydrALAZINE (APRESOLINE) 100 MG tablet Take 100 mg by mouth 2 (two) times daily.    Historical Provider, MD  hydrOXYzine (ATARAX/VISTARIL) 25 MG tablet Take 25 mg by mouth every 6 (six) hours as needed for itching.     Historical Provider, MD  labetalol (NORMODYNE) 200 MG tablet Take 200 mg by mouth 2 (two) times daily.    Historical Provider, MD  lactobacillus acidophilus & bulgar (LACTINEX) chewable tablet Chew 1 tablet by mouth daily with breakfast.     Historical Provider, MD  levETIRAcetam (KEPPRA) 750 MG tablet Take 750 mg by mouth 2 (two) times daily.    Historical Provider, MD  loperamide (IMODIUM) 2 MG capsule Take by mouth as needed for diarrhea or loose stools.    Historical Provider, MD  losartan (COZAAR) 50 MG tablet Take 50 mg by mouth daily with breakfast.    Historical Provider, MD  magnesium hydroxide (MILK OF MAGNESIA) 400 MG/5ML suspension Take 30 mLs by mouth at bedtime as needed for mild constipation.    Historical Provider, MD  neomycin-bacitracin-polymyxin (NEOSPORIN) ointment Apply 1 application topically as needed for wound care.    Historical Provider, MD  ondansetron (ZOFRAN ODT) 4 MG disintegrating tablet Take 1 tablet (4 mg total) by mouth every 8 (eight) hours as needed for nausea or vomiting. 04/15/16   Antony Madura, PA-C  polyethylene glycol powder (GLYCOLAX/MIRALAX) powder Take 17 g by mouth 2 (two) times daily. Use for constipation, as prescribed, until daily soft stools  OTC 04/15/16   Antony Madura, PA-C  sertraline (ZOLOFT) 100 MG tablet Take 100 mg by mouth daily with breakfast.     Historical Provider, MD  tamsulosin (FLOMAX) 0.4 MG CAPS capsule Take 1 capsule (0.4 mg total) by mouth daily. 04/22/16   Clanford Cyndie Mull, MD  tiotropium (SPIRIVA) 18 MCG inhalation  capsule Place 18 mcg into inhaler and inhale daily.    Historical Provider, MD  vitamin B-12 (CYANOCOBALAMIN) 1000 MCG tablet Take 1,000 mcg by mouth daily with breakfast.     Historical Provider, MD  zolpidem (AMBIEN CR) 12.5 MG CR tablet Take 12.5 mg by mouth at bedtime.     Historical Provider, MD    Family History Family History  Problem Relation Age of Onset  . CVA Mother 8  . Aneurysm Father     Social History Social History  Substance Use Topics  . Smoking status: Former Smoker    Quit date: 2013  . Smokeless tobacco: Never Used  . Alcohol use No     Comment: h/o heavy ETOH use     Allergies   Sulfa antibiotics   Review of Systems  Review of Systems  Constitutional: Negative for chills and fever.  HENT: Negative for ear pain and sore throat.   Eyes: Negative for pain and visual disturbance.  Respiratory: Negative for cough and shortness of breath.   Cardiovascular: Negative for chest pain and palpitations.  Gastrointestinal: Negative for abdominal pain and vomiting.  Genitourinary: Negative for dysuria and hematuria.  Musculoskeletal: Positive for arthralgias (R hand) and myalgias (R hand). Negative for back pain.  Skin: Negative for color change and rash.  Neurological: Negative for seizures and syncope.  All other systems reviewed and are negative.    Physical Exam Updated Vital Signs BP 122/69 (BP Location: Left Arm)   Pulse 66   Temp 97.8 F (36.6 C) (Oral)   Resp 17   SpO2 93%   Physical Exam  Constitutional: He appears well-developed and well-nourished.  HENT:  Head: Normocephalic and atraumatic.  Eyes: Conjunctivae are normal.  Neck: Neck supple.  Cardiovascular: Normal rate and regular rhythm.   No murmur heard. Pulmonary/Chest: Effort normal and breath sounds normal. No respiratory distress.  Abdominal: Soft. There is no tenderness.  Musculoskeletal: He exhibits no edema.       Right hand: He exhibits swelling.  Hemorrhage to the sub  thenar area and the palm, bruising to the back of the hand from the MCP of the 3rd, 4th and 5th digits to the wrist.  Neurological: He is alert.  Skin: Skin is warm and dry.  Psychiatric: He has a normal mood and affect.  Nursing note and vitals reviewed.    ED Treatments / Results   DIAGNOSTIC STUDIES: Oxygen Saturation is 93% on RA, inadequate by my interpretation.   COORDINATION OF CARE: 3:04 AM-Discussed next steps with pt. Pt verbalized understanding and is agreeable with the plan.    Labs (all labs ordered are listed, but only abnormal results are displayed) Labs Reviewed - No data to display  EKG  EKG Interpretation None       Radiology Dg Hand Complete Right  Result Date: 05/22/2016 CLINICAL DATA:  RIGHT hand pain after fall yesterday. EXAM: RIGHT HAND - COMPLETE 3+ VIEW COMPARISON:  RIGHT hand radiographs May 21, 2016 FINDINGS: Re- demonstration of acute fourth and fifth distal metacarpal fractures with palmar angulation, no intra-articular extension. No dislocation. No destructive bony lesions. Osteopenia. No destructive bony lesions. Vascular clips forearm volar soft tissues. Dorsal hand soft tissue swelling without subcutaneous gas or radiopaque foreign bodies. IMPRESSION: Stable appearance of acute displaced fourth and fifth metacarpal fractures. Electronically Signed   By: Awilda Metroourtnay  Bloomer M.D.   On: 05/22/2016 02:24   Dg Hand Complete Right  Result Date: 05/21/2016 CLINICAL DATA:  Fall. EXAM: RIGHT HAND - COMPLETE 3+ VIEW COMPARISON:  No recent prior. FINDINGS: Angulated fracture of the distal right fourth and fifth metacarpals. Diffuse osteopenia degenerative change. Surgical clips right wrist. IMPRESSION: Angulated fractures of the distal right fourth and fifth metacarpals. Electronically Signed   By: Maisie Fushomas  Register   On: 05/21/2016 16:12    Procedures Procedures (including critical care time)  Medications Ordered in ED Medications - No data to  display   Initial Impression / Assessment and Plan / ED Course  I have reviewed the triage vital signs and the nursing notes.  Pertinent labs & imaging results that were available during my care of the patient were reviewed by me and considered in my medical decision making (see chart for details).  Clinical Course      Change in appearance of the fractures.  Patient has some bruising to the hand.  He will be referred to hand surgery for evaluation.  Final Clinical Impressions(s) / ED Diagnoses   Final diagnoses:  Closed displaced fracture of shaft of fourth metacarpal bone with routine healing, unspecified laterality, subsequent encounter    New Prescriptions New Prescriptions   No medications on file   I personally performed the services described in this documentation, which was scribed in my presence. The recorded information has been reviewed and is accurate.    Daniel Favor, NP 05/22/16 1610    Pricilla Loveless, MD 05/22/16 1729

## 2016-05-22 NOTE — ED Notes (Signed)
Patient returned from X-ray 

## 2016-05-22 NOTE — ED Triage Notes (Addendum)
[facility]; pt denies new pain since yesterday; pt tripped on his way to his recliner and caught himself on his hands; denies hitting head; no new neuro symptom

## 2016-05-22 NOTE — ED Notes (Signed)
[facility] contacted for transport 

## 2017-02-22 ENCOUNTER — Ambulatory Visit: Payer: Medicare HMO | Admitting: Cardiology

## 2017-03-09 NOTE — Progress Notes (Signed)
Cardiology Office Note   Date:  03/10/2017   ID:  Daniel Oconnell, DOB [birth year], MRN [id number]  PCP:  No primary care provider on file.  Cardiologist:   Rollene Rotunda, MD    Chief Complaint  Patient presents with  . Coronary Artery Disease      History of Present Illness: Daniel Oconnell is a 61 y.o. male who presents for follow-up of coronary disease. He had a history of bypass surgery in 2001 at [place].   He did have a stroke in 2001 as well.  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 He had carotid stenting. He does report having bypass surgery. He doesn't report ever having symptoms.  The patient has multiple medical problems.    He has aphasia and so is difficult to get a history but he denies any acute shortness of breath, PND or orthopnea. He doesn't have any chest pressure, neck or arm discomfort. However, he's had some increased lower extremity swelling. He says this has been for months but the person who is here with him says that it has been weeks.  We called the nursing home and found out that he had been given 7 days of low dose Lasix earlier this month.  There have not been recent labs.    Past Medical History:  Diagnosis Date  . 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 CAD (coronary artery disease) 2001   CABG  . COPD (chronic obstructive pulmonary disease) (HCC)   . CVA (cerebral vascular accident) (HCC) 2001, 2013   x2  . Expressive aphasia   . 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 Hyperlipidemia   . Hypertension     Past Surgical History:  Procedure Laterality Date  . APPENDECTOMY    . CORONARY ARTERY BYPASS GRAFT  2001  . LUMBAR SPINE SURGERY       Current Outpatient Prescriptions  Medication Sig Dispense Refill  . acetaminophen (TYLENOL) 500 MG tablet Take 500 mg by mouth every 4 (four) hours as needed for mild pain, moderate pain, fever or headache.     . albuterol (PROVENTIL) (2.5 MG/3ML)  0.083% nebulizer solution Take 3 mLs (2.5 mg total) by nebulization every 4 (four) hours as needed for wheezing or shortness of breath. 75 mL 0  . alum & mag hydroxide-simeth (MINTOX) 200-200-20 MG/5ML suspension Take 30 mLs by mouth as needed for indigestion or heartburn.    Marland Kitchen amLODipine (NORVASC) 10 MG tablet Take 10 mg by mouth at bedtime.     Marland Kitchen aspirin 325 MG EC tablet Take 325 mg by mouth daily with breakfast.     . atorvastatin (LIPITOR) 80 MG tablet Take 80 mg by mouth at bedtime.     . baclofen (LIORESAL) 20 MG tablet Take 20 mg by mouth 3 (three) times daily.    . budesonide (PULMICORT) 0.25 MG/2ML nebulizer solution Take 2 mLs (0.25 mg total) by nebulization 2 (two) times daily. 60 mL 0  . cetirizine (ZYRTEC) 10 MG tablet Take 10 mg by mouth daily with breakfast.     . Cholecalciferol (VITAMIN D) 2000 units tablet Take 2,000 Units by mouth daily with breakfast.    . clopidogrel (PLAVIX) 75 MG tablet Take 75 mg by mouth daily with breakfast.     . dicyclomine (BENTYL) 20 MG tablet Take 1 tablet (20 mg total) by mouth 2 (two) times daily. Take for abdominal pain/cramping 20 tablet 0  .  guaifenesin (ROBAFEN) 100 MG/5ML syrup Take 200 mg by mouth every 6 (six) hours as needed for cough.    . hydrALAZINE (APRESOLINE) 100 MG tablet Take 100 mg by mouth 2 (two) times daily.    . hydrOXYzine (ATARAX/VISTARIL) 25 MG tablet Take 25 mg by mouth every 6 (six) hours as needed for itching.     . labetalol (NORMODYNE) 200 MG tablet Take 200 mg by mouth 2 (two) times daily.    Marland Kitchen. lactobacillus acidophilus & bulgar (LACTINEX) chewable tablet Chew 1 tablet by mouth daily with breakfast.     . levETIRAcetam (KEPPRA) 750 MG tablet Take 750 mg by mouth 2 (two) times daily.    Marland Kitchen. loperamide (IMODIUM) 2 MG capsule Take by mouth as needed for diarrhea or loose stools.    Marland Kitchen. losartan (COZAAR) 50 MG tablet Take 50 mg by mouth daily with breakfast.    . magnesium hydroxide (MILK OF MAGNESIA) 400 MG/5ML suspension  Take 30 mLs by mouth at bedtime as needed for mild constipation.    Marland Kitchen. neomycin-bacitracin-polymyxin (NEOSPORIN) ointment Apply 1 application topically as needed for wound care.    . ondansetron (ZOFRAN ODT) 4 MG disintegrating tablet Take 1 tablet (4 mg total) by mouth every 8 (eight) hours as needed for nausea or vomiting. 10 tablet 0  . polyethylene glycol powder (GLYCOLAX/MIRALAX) powder Take 17 g by mouth 2 (two) times daily. Use for constipation, as prescribed, until daily soft stools  OTC 119 g 0  . sertraline (ZOLOFT) 100 MG tablet Take 100 mg by mouth daily with breakfast.     . tamsulosin (FLOMAX) 0.4 MG CAPS capsule Take 1 capsule (0.4 mg total) by mouth daily. 30 capsule 0  . tiotropium (SPIRIVA) 18 MCG inhalation capsule Place 18 mcg into inhaler and inhale daily.    . vitamin B-12 (CYANOCOBALAMIN) 1000 MCG tablet Take 1,000 mcg by mouth daily with breakfast.     . zolpidem (AMBIEN CR) 12.5 MG CR tablet Take 12.5 mg by mouth at bedtime.     . furosemide (LASIX) 20 MG tablet Take 1 tablet (20 mg total) by mouth daily. 90 tablet 3   No current facility-administered medications for this visit.     Allergies:   Clonidine and Sulfa antibiotics    ROS:  Please see the history of present illness.   Otherwise, review of systems are positive for none.   All other systems are reviewed and negative.    PHYSICAL EXAM: VS:  BP 124/68   Pulse 65   Ht 5\' 10"  (1.778 m)   Wt 190 lb 3.2 oz (86.3 kg)   BMI 27.29 kg/m  , BMI Body mass index is 27.29 kg/m.  GENERAL:  Well appearing NECK:  No jugular venous distention, waveform within normal limits, carotid upstroke brisk and symmetric, no bruits, no thyromegaly LUNGS:  Clear to auscultation bilaterally CHEST:  Unremarkable HEART:  PMI not displaced or sustained,S1 and S2 within normal limits, no S3, no S4, no clicks, no rubs, no murmurs ABD:  Flat, positive bowel sounds normal in frequency in pitch, no bruits, no rebound, no guarding, no  midline pulsatile mass, no hepatomegaly, no splenomegaly EXT:  2 plus pulses throughout, moderate ankle edema, no cyanosis no clubbing NEURO:  Right arm hemiparesis.    EKG:  EKG  ordered today. Sinus rhythm, rate 65, axis within normal limits, QTC slightly prolonged, lateral T-wave inversions unchanged from previous.   Recent Labs: 04/16/2016: ALT 17; B Natriuretic Peptide 441.4 04/17/2016: Magnesium 1.9  04/20/2016: Hemoglobin 13.4; Platelets 262 04/22/2016: BUN 20; Creatinine, Ser 1.00; Potassium 3.6; Sodium 137    Lipid Panel No results found for: CHOL, TRIG, HDL, CHOLHDL, VLDL, LDLCALC, LDLDIRECT    Wt Readings from Last 3 Encounters:  03/10/17 190 lb 3.2 oz (86.3 kg)  04/16/16 197 lb 5 oz (89.5 kg)  04/14/16 180 lb (81.6 kg)      Other studies Reviewed: Additional studies/ records that were reviewed today include: We called the nursing home to find out about meds and labs.  Review of the above records demonstrates:     ASSESSMENT AND PLAN:  CAD:  The patient has no new sypmtoms.  No further cardiovascular testing is indicated.  We will continue with aggressive risk reduction and meds as listed.  HTN:  The blood pressure is at target. No change in medications is indicated. We will continue with therapeutic lifestyle changes (TLC).  DYSLIPIDEMIA:  I will order lipids today despite the fact that he is not fasting.    ABNORMAL EKG:  This is unchanged.   ANKLE EDEMA:  I will go ahead and restart Lasix 20 mg.  He will get a BMET today and again in two weeks.    Current medicines are reviewed at length with the patient today.  The patient does not have concerns regarding medicines.  The following changes have been made:  As above.   Labs/ tests ordered today include:    Orders Placed This Encounter  Procedures  . Basic Metabolic Panel (BMET)  . Lipid panel  . Basic Metabolic Panel (BMET)     Disposition:   FU with 6 months with APP.  Forest Becker, MD  03/10/2017 7:50 PM    [facility]

## 2017-03-10 ENCOUNTER — Ambulatory Visit (INDEPENDENT_AMBULATORY_CARE_PROVIDER_SITE_OTHER): Payer: Medicare HMO | Admitting: Cardiology

## 2017-03-10 ENCOUNTER — Encounter: Payer: Self-pay | Admitting: Cardiology

## 2017-03-10 VITALS — BP 124/68 | HR 65 | Ht 70.0 in | Wt 190.2 lb

## 2017-03-10 DIAGNOSIS — I2510 Atherosclerotic heart disease of native coronary artery without angina pectoris: Secondary | ICD-10-CM | POA: Diagnosis not present

## 2017-03-10 DIAGNOSIS — Z79899 Other long term (current) drug therapy: Secondary | ICD-10-CM | POA: Diagnosis not present

## 2017-03-10 DIAGNOSIS — I10 Essential (primary) hypertension: Secondary | ICD-10-CM

## 2017-03-10 DIAGNOSIS — E785 Hyperlipidemia, unspecified: Secondary | ICD-10-CM | POA: Diagnosis not present

## 2017-03-10 DIAGNOSIS — M7989 Other specified soft tissue disorders: Secondary | ICD-10-CM

## 2017-03-10 MED ORDER — FUROSEMIDE 20 MG PO TABS: 20.0000 mg | ORAL_TABLET | Freq: Every day | ORAL | 3 refills | Status: DC

## 2017-03-10 NOTE — Patient Instructions (Addendum)
Medication Instructions:  START- Lasix 20 mg daily  If you need a refill on your cardiac medications before your next appointment, please call your pharmacy.  Labwork: BMP and Fasting Lipids HERE IN OUR OFFICE AT [facility] BMP in 1 Week  Testing/Procedures: None Ordered  Follow-Up: Your physician wants you to follow-up in: 6 Months.    Thank you for choosing [facility] at [facility]!!

## 2017-03-11 LAB — BASIC METABOLIC PANEL
BUN/Creatinine Ratio: 11 (ref 10–24)
BUN: 13 mg/dL (ref 8–27)
CO2: 26 mmol/L (ref 20–29)
CREATININE: 1.16 mg/dL (ref 0.76–1.27)
Calcium: 9.3 mg/dL (ref 8.6–10.2)
Chloride: 102 mmol/L (ref 96–106)
GFR calc Af Amer: 77 mL/min/{1.73_m2} (ref >59)
GFR calc non Af Amer: 67 mL/min/{1.73_m2} (ref >59)
GLUCOSE: 81 mg/dL (ref 65–99)
Potassium: 4.9 mmol/L (ref 3.5–5.2)
SODIUM: 145 mmol/L — AB (ref 134–144)

## 2017-03-11 LAB — LIPID PANEL
CHOLESTEROL TOTAL: 109 mg/dL (ref 100–199)
Chol/HDL Ratio: 3.1 ratio (ref 0.0–5.0)
HDL: 35 mg/dL — ABNORMAL LOW (ref >39)
LDL CALC: 47 mg/dL (ref 0–99)
TRIGLYCERIDES: 135 mg/dL (ref 0–149)
VLDL Cholesterol Cal: 27 mg/dL (ref 5–40)

## 2017-03-22 ENCOUNTER — Telehealth: Payer: Self-pay | Admitting: Cardiology

## 2017-03-22 NOTE — Telephone Encounter (Signed)
Left message to call back  

## 2017-03-22 NOTE — Telephone Encounter (Signed)
Pt need his office notes sent from his last ov. [facility],this is where the pt resides.Please fax to [phone] BMW:[id number]:Cindyavisity

## 2017-03-23 NOTE — Telephone Encounter (Signed)
Last office note faxed to the number provided. ?

## 2018-04-02 NOTE — Progress Notes (Signed)
Cardiology Office Note   Date:  04/04/2018   ID:  Daniel Oconnell, DOB 1955/08/15, MRN [id number]  PCP:  Uvaldo BristleKurth-Bowen, Cornelia, PA-C  Cardiologist:   Rollene RotundaJames Rodgerick Gilliand, MD    Chief Complaint  Patient presents with  . Coronary Artery Disease      History of Present Illness: Daniel Oconnell is a 62 y.o. male who presents for follow-up of coronary disease. He had a history of bypass surgery in 2001 at [facility].   He did have a stroke in 2001 as well.  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 He had carotid stenting. He does report having bypass surgery. He doesn't report ever having symptoms.  The patient has multiple medical problems.    Since he was last seen he has done well.  He walks independently despite mild left-sided leg weakness.  He drags his left foot a little bit.  He denies any cardiovascular symptoms however.The patient denies any new symptoms such as chest discomfort, neck or arm discomfort. There has been no new shortness of breath, PND or orthopnea. There have been no reported palpitations, presyncope or syncope.    Past Medical History:  Diagnosis Date  . 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 CAD (coronary artery disease) 2001   CABG  . COPD (chronic obstructive pulmonary disease) (HCC)   . CVA (cerebral vascular accident) (HCC) 2001, 2013   x2  . Expressive aphasia   . 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 Hyperlipidemia   . Hypertension     Past Surgical History:  Procedure Laterality Date  . APPENDECTOMY    . CORONARY ARTERY BYPASS GRAFT  2001  . LUMBAR SPINE SURGERY       Current Outpatient Medications  Medication Sig Dispense Refill  . acetaminophen (TYLENOL) 500 MG tablet Take 500 mg by mouth every 4 (four) hours as needed for mild pain, moderate pain, fever or headache.     . albuterol (PROVENTIL) (2.5 MG/3ML) 0.083% nebulizer solution Take 3 mLs (2.5 mg total) by nebulization every 4 (four)  hours as needed for wheezing or shortness of breath. 75 mL 0  . alum & mag hydroxide-simeth (MINTOX) 200-200-20 MG/5ML suspension Take 30 mLs by mouth as needed for indigestion or heartburn.    Marland Kitchen. amLODipine (NORVASC) 10 MG tablet Take 10 mg by mouth at bedtime.     Marland Kitchen. aspirin 325 MG EC tablet Take 325 mg by mouth daily with breakfast.     . atorvastatin (LIPITOR) 80 MG tablet Take 80 mg by mouth at bedtime.     . baclofen (LIORESAL) 20 MG tablet Take 20 mg by mouth 3 (three) times daily.    . budesonide (PULMICORT) 0.25 MG/2ML nebulizer solution Take 2 mLs (0.25 mg total) by nebulization 2 (two) times daily. 60 mL 0  . cetirizine (ZYRTEC) 10 MG tablet Take 10 mg by mouth daily with breakfast.     . Cholecalciferol (VITAMIN D) 2000 units tablet Take 2,000 Units by mouth daily with breakfast.    . clopidogrel (PLAVIX) 75 MG tablet Take 75 mg by mouth daily with breakfast.     . dicyclomine (BENTYL) 20 MG tablet Take 1 tablet (20 mg total) by mouth 2 (two) times daily. Take for abdominal pain/cramping 20 tablet 0  . guaifenesin (ROBAFEN) 100 MG/5ML syrup Take 200 mg by mouth every 6 (six) hours as needed for cough.    . hydrALAZINE (APRESOLINE) 100 MG  tablet Take 100 mg by mouth 2 (two) times daily.    . hydrOXYzine (ATARAX/VISTARIL) 25 MG tablet Take 25 mg by mouth every 6 (six) hours as needed for itching.     . labetalol (NORMODYNE) 200 MG tablet Take 200 mg by mouth 2 (two) times daily.    Marland Kitchen lactobacillus acidophilus & bulgar (LACTINEX) chewable tablet Chew 1 tablet by mouth daily with breakfast.     . levETIRAcetam (KEPPRA) 750 MG tablet Take 750 mg by mouth 2 (two) times daily.    Marland Kitchen loperamide (IMODIUM) 2 MG capsule Take by mouth as needed for diarrhea or loose stools.    Marland Kitchen losartan (COZAAR) 50 MG tablet Take 50 mg by mouth daily with breakfast.    . magnesium hydroxide (MILK OF MAGNESIA) 400 MG/5ML suspension Take 30 mLs by mouth at bedtime as needed for mild constipation.    Marland Kitchen  neomycin-bacitracin-polymyxin (NEOSPORIN) ointment Apply 1 application topically as needed for wound care.    . ondansetron (ZOFRAN ODT) 4 MG disintegrating tablet Take 1 tablet (4 mg total) by mouth every 8 (eight) hours as needed for nausea or vomiting. 10 tablet 0  . polyethylene glycol powder (GLYCOLAX/MIRALAX) powder Take 17 g by mouth 2 (two) times daily. Use for constipation, as prescribed, until daily soft stools  OTC 119 g 0  . sertraline (ZOLOFT) 100 MG tablet Take 100 mg by mouth daily with breakfast.     . tamsulosin (FLOMAX) 0.4 MG CAPS capsule Take 1 capsule (0.4 mg total) by mouth daily. 30 capsule 0  . tiotropium (SPIRIVA) 18 MCG inhalation capsule Place 18 mcg into inhaler and inhale daily.    . vitamin B-12 (CYANOCOBALAMIN) 1000 MCG tablet Take 1,000 mcg by mouth daily with breakfast.     . zolpidem (AMBIEN CR) 12.5 MG CR tablet Take 12.5 mg by mouth at bedtime.     . furosemide (LASIX) 20 MG tablet Take 1 tablet (20 mg total) by mouth daily. 90 tablet 3  . levothyroxine (SYNTHROID, LEVOTHROID) 25 MCG tablet Take 1 tablet by mouth daily.    . temazepam (RESTORIL) 15 MG capsule      No current facility-administered medications for this visit.     Allergies:   Clonidine and Sulfa antibiotics    ROS:  Please see the history of present illness.   Otherwise, review of systems are positive for none.   All other systems are reviewed and negative.    PHYSICAL EXAM: VS:  BP 102/68   Pulse (!) 55   Ht 5\' 10"  (1.778 m)   Wt 180 lb (81.6 kg)   BMI 25.83 kg/m  , BMI Body mass index is 25.83 kg/m.  GENERAL:  Well appearing NECK:  No jugular venous distention, waveform within normal limits, carotid upstroke brisk and symmetric, no bruits, no thyromegaly LUNGS:  Clear to auscultation bilaterally CHEST:  Unremarkable HEART:  PMI not displaced or sustained,S1 and S2 within normal limits, no S3, no S4, no clicks, no rubs, no murmurs ABD:  Flat, positive bowel sounds normal in  frequency in pitch, no bruits, no rebound, no guarding, no midline pulsatile mass, no hepatomegaly, no splenomegaly EXT:  2 plus pulses throughout, no edema, no cyanosis no clubbing NEURO:  Right arm hemiparesis.     EKG:  EKG  is ordered today. Sinus rhythm, rate 55, axis within normal limits, QTC slightly prolonged, lateral T-wave inversions unchanged from previous.   Recent Labs: No results found for requested labs within last 8760 hours.  Lipid Panel    Component Value Date/Time   CHOL 109 03/10/2017 1451   TRIG 135 03/10/2017 1451   HDL 35 (L) 03/10/2017 1451   CHOLHDL 3.1 03/10/2017 1451   LDLCALC 47 03/10/2017 1451      Wt Readings from Last 3 Encounters:  04/04/18 180 lb (81.6 kg)  03/10/17 190 lb 3.2 oz (86.3 kg)  04/16/16 197 lb 5 oz (89.5 kg)      Other studies Reviewed: Additional studies/ records that were reviewed today include: We called the nursing home to find out about meds and labs.  Review of the above records demonstrates:     ASSESSMENT AND PLAN:  CAD:  The patient has no new sypmtoms.  No further cardiovascular testing is indicated.  We will continue with aggressive risk reduction and meds as listed.  HTN:  The blood pressure is at target.  No change in therapy.   DYSLIPIDEMIA:   LDL last year was at target.  I will repeat this.   ABNORMAL EKG:  This is unchanged. No further work up  ANKLE EDEMA:  Last year he was started on Lasix secondary to this.  This is resolved.  He remains on the low dose of diuretic.    Current medicines are reviewed at length with the patient today.  The patient does not have concerns regarding medicines.  The following changes have been made:  None  Labs/ tests ordered today include:    Orders Placed This Encounter  Procedures  . EKG 12-Lead     Disposition:   FU with me in 18 months.    Signed, Rollene Rotunda, MD  04/04/2018 12:27 PM    [facility]

## 2018-04-04 ENCOUNTER — Encounter: Payer: Self-pay | Admitting: Cardiology

## 2018-04-04 ENCOUNTER — Ambulatory Visit: Payer: Medicare HMO | Admitting: Cardiology

## 2018-04-04 VITALS — BP 102/68 | HR 55 | Ht 70.0 in | Wt 180.0 lb

## 2018-04-04 DIAGNOSIS — I2510 Atherosclerotic heart disease of native coronary artery without angina pectoris: Secondary | ICD-10-CM

## 2018-04-04 DIAGNOSIS — E785 Hyperlipidemia, unspecified: Secondary | ICD-10-CM

## 2018-04-04 NOTE — Patient Instructions (Addendum)
Medication Instructions:  Continue current medications  If you need a refill on your cardiac medications before your next appointment, please call your pharmacy.  Labwork: [facility] at [facility] and fax back to [phone]  HERE IN OUR OFFICE AT [facility]  Take the provided lab slips with you to the lab for your blood draw.   You will need to fast. DO NOT EAT OR DRINK PAST MIDNIGHT.    If you have labs (blood work) drawn today and your tests are completely normal, you will receive your results only by: Marland Kitchen. MyChart Message (if you have MyChart) OR . A paper copy in the mail If you have any lab test that is abnormal or we need to change your treatment, we will call you to review the results.  Testing/Procedures: None Ordered  Follow-Up: You will need a follow up appointment in 18 Months.  Please call our office 2 months in [phone] to schedule the (1 Year) appointment.  You may see  DR Antoine PocheHochrein, or one of the following Advanced Practice Providers on your designated Care Team:    . Joni ReiningKathryn Lawrence, DNP, ANP . Rhonda Barrett, PA-C  . Corine ShelterLuke Kilroy, [place] JerseyPA-C  . Azalee CourseHao Meng, PA-C . Micah FlesherAngela Duke, PA-C  At [facility], you and your health needs are our priority.  As part of our continuing mission to provide you with exceptional heart care, we have created designated Provider Care Teams.  These Care Teams include your primary Cardiologist (physician) and Advanced Practice Providers (APPs -  Physician Assistants and Nurse Practitioners) who all work together to provide you with the care you need, when you need it.   Thank you for choosing [facility] at [facility]!!

## 2019-02-04 ENCOUNTER — Emergency Department (HOSPITAL_COMMUNITY)
Admission: EM | Admit: 2019-02-04 | Discharge: 2019-02-04 | Disposition: A | Discharge status: Home / Self Care | Payer: Medicare HMO | Attending: Emergency Medicine | Admitting: Emergency Medicine

## 2019-02-04 ENCOUNTER — Encounter (HOSPITAL_COMMUNITY): Payer: Self-pay

## 2019-02-04 ENCOUNTER — Other Ambulatory Visit: Payer: Self-pay

## 2019-02-04 ENCOUNTER — Emergency Department (HOSPITAL_COMMUNITY): Payer: Medicare HMO

## 2019-02-04 DIAGNOSIS — Z7901 Long term (current) use of anticoagulants: Secondary | ICD-10-CM | POA: Insufficient documentation

## 2019-02-04 DIAGNOSIS — Z79899 Other long term (current) drug therapy: Secondary | ICD-10-CM | POA: Insufficient documentation

## 2019-02-04 DIAGNOSIS — Z87891 Personal history of nicotine dependence: Secondary | ICD-10-CM | POA: Diagnosis not present

## 2019-02-04 DIAGNOSIS — I2510 Atherosclerotic heart disease of native coronary artery without angina pectoris: Secondary | ICD-10-CM | POA: Insufficient documentation

## 2019-02-04 DIAGNOSIS — Z7982 Long term (current) use of aspirin: Secondary | ICD-10-CM | POA: Diagnosis not present

## 2019-02-04 DIAGNOSIS — I10 Essential (primary) hypertension: Secondary | ICD-10-CM | POA: Insufficient documentation

## 2019-02-04 DIAGNOSIS — Z951 Presence of aortocoronary bypass graft: Secondary | ICD-10-CM | POA: Insufficient documentation

## 2019-02-04 DIAGNOSIS — R072 Precordial pain: Secondary | ICD-10-CM | POA: Insufficient documentation

## 2019-02-04 DIAGNOSIS — R0789 Other chest pain: Secondary | ICD-10-CM | POA: Diagnosis present

## 2019-02-04 LAB — BASIC METABOLIC PANEL
Anion gap: 10 (ref 5–15)
BUN: 9 mg/dL (ref 8–23)
CO2: 28 mmol/L (ref 22–32)
Calcium: 8.9 mg/dL (ref 8.9–10.3)
Chloride: 99 mmol/L (ref 98–111)
Creatinine, Ser: 1.03 mg/dL (ref 0.61–1.24)
GFR calc Af Amer: >60 mL/min (ref >60)
GFR calc non Af Amer: >60 mL/min (ref >60)
Glucose, Bld: 62 mg/dL — ABNORMAL LOW (ref 70–99)
Potassium: 3.4 mmol/L — ABNORMAL LOW (ref 3.5–5.1)
Sodium: 137 mmol/L (ref 135–145)

## 2019-02-04 LAB — CBC
HCT: 44.2 % (ref 39.0–52.0)
Hemoglobin: 14.4 g/dL (ref 13.0–17.0)
MCH: 28.5 pg (ref 26.0–34.0)
MCHC: 32.6 g/dL (ref 30.0–36.0)
MCV: 87.4 fL (ref 80.0–100.0)
Platelets: 237 10*3/uL (ref 150–400)
RBC: 5.06 MIL/uL (ref 4.22–5.81)
RDW: 14.7 % (ref 11.5–15.5)
WBC: 8.5 10*3/uL (ref 4.0–10.5)
nRBC: 0 % (ref 0.0–0.2)

## 2019-02-04 LAB — CBG MONITORING, ED: Glucose-Capillary: 90 mg/dL (ref 70–99)

## 2019-02-04 LAB — TROPONIN I (HIGH SENSITIVITY)
Troponin I (High Sensitivity): 7 ng/L (ref <18)
Troponin I (High Sensitivity): 8 ng/L (ref <18)

## 2019-02-04 MED ORDER — POTASSIUM CHLORIDE CRYS ER 20 MEQ PO TBCR
40.0000 meq | EXTENDED_RELEASE_TABLET | Freq: Once | ORAL | Status: AC
  Administered 2019-02-04: 40 meq via ORAL
  Filled 2019-02-04: qty 2

## 2019-02-04 NOTE — ED Provider Notes (Signed)
[facility]ERGENCY DEPARTMENT Provider Note   CSN: [id number] Arrival date & time: 02/04/19  1331     History   Chief Complaint Chief Complaint  Patient presents with  . Chest Pain    HPI Daniel Oconnell is a 63 y.o. male.     Patient with hx cad, cabg 2001, c/o mid chest pain at rest today at [facility], pain acute onset, moderate, mid chest, non radiating, not pleuritic, lasted several minutes and is now resolved. No associated sob, nv or diaphoresis. No palpitations. No cough or uri symptoms. No fever or chills. No abd pain. No back pain. No leg pain or swelling. Denies any exertional cp or discomfort. No unusual doe or fatigue.   The history is provided by the patient and the spouse.  Chest Pain Associated symptoms: no abdominal pain, no back pain, no fever, no headache, no palpitations and no shortness of breath     Past Medical History:  Diagnosis Date  . 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 CAD (coronary artery disease) 2001   CABG  . COPD (chronic obstructive pulmonary disease) ([place])   . CVA (cerebral vascular accident) ([place]) 2001, 2013   x2  . Expressive aphasia   . 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 Hyperlipidemia   . Hypertension     Patient Active Problem List   Diagnosis Date Noted  . Leg swelling 03/10/2017  . Medication management 03/10/2017  . Hyperlipidemia 03/10/2017  . 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 Expressive aphasia   . Acute respiratory failure with hypoxia ([place]) 04/17/2016  . Abdominal pain, acute, right lower quadrant 04/17/2016  . Nonspecific abnormal electrocardiogram (ECG) (EKG) 04/17/2016  . H/O: CVA (cerebrovascular accident) 04/17/2016  . COPD (chronic obstructive pulmonary disease) ([place]) 04/17/2016  . CAD (coronary artery disease) 04/17/2016  . Essential hypertension 04/17/2016  . Right lower quadrant abdominal pain   . HCAP (healthcare-associated pneumonia) 04/16/2016     Past Surgical History:  Procedure Laterality Date  . APPENDECTOMY    . CORONARY ARTERY BYPASS GRAFT  2001  . LUMBAR SPINE SURGERY          Home Medications    Prior to Admission medications   Medication Sig Start Date End Date Taking? Authorizing Provider  acetaminophen (TYLENOL) 500 MG tablet Take 500 mg by mouth every 4 (four) hours as needed for mild pain, moderate pain, fever or headache.     [provider]  albuterol (PROVENTIL) (2.5 MG/3ML) 0.083% nebulizer solution Take 3 mLs (2.5 mg total) by nebulization every 4 (four) hours as needed for wheezing or shortness of breath. 04/22/16   Johnson, Clanford L, MD  alum & mag hydroxide-simeth ([place]) 200-200-20 MG/5ML suspension Take 30 mLs by mouth as needed for indigestion or heartburn.    [provider]  amLODipine (NORVASC) 10 MG tablet Take 10 mg by mouth at bedtime.     [provider]  aspirin 325 MG EC tablet Take 325 mg by mouth daily with breakfast.     [provider]  atorvastatin (LIPITOR) 80 MG tablet Take 80 mg by mouth at bedtime.     [provider]  baclofen (LIORESAL) 20 MG tablet Take 20 mg by mouth 3 (three) times daily.    [provider]  budesonide (PULMICORT) 0.25 MG/2ML nebulizer solution Take 2 mLs (0.25 mg total) by nebulization 2 (two) times daily. 04/22/16   Murlean Iba, MD  cetirizine (ZYRTEC)  10 MG tablet Take 10 mg by mouth daily with breakfast.     [provider]  Cholecalciferol (VITAMIN D) 2000 units tablet Take 2,000 Units by mouth daily with breakfast.    [provider]  clopidogrel (PLAVIX) 75 MG tablet Take 75 mg by mouth daily with breakfast.     [provider]  dicyclomine (BENTYL) 20 MG tablet Take 1 tablet (20 mg total) by mouth 2 (two) times daily. Take for abdominal pain/cramping 04/15/16   Antony MaduraHumes, Kelly, PA-C  furosemide (LASIX) 20 MG tablet Take 1 tablet (20 mg total) by mouth daily. 03/10/17 06/08/17   Rollene RotundaHochrein, James, MD  guaifenesin (ROBAFEN) 100 MG/5ML syrup Take 200 mg by mouth every 6 (six) hours as needed for cough.    [provider]  hydrALAZINE (APRESOLINE) 100 MG tablet Take 100 mg by mouth 2 (two) times daily.    [provider]  hydrOXYzine (ATARAX/VISTARIL) 25 MG tablet Take 25 mg by mouth every 6 (six) hours as needed for itching.     [provider]  labetalol (NORMODYNE) 200 MG tablet Take 200 mg by mouth 2 (two) times daily.    [provider]  lactobacillus acidophilus & bulgar (LACTINEX) chewable tablet Chew 1 tablet by mouth daily with breakfast.     [provider]  levETIRAcetam (KEPPRA) 750 MG tablet Take 750 mg by mouth 2 (two) times daily.    [provider]  levothyroxine (SYNTHROID, LEVOTHROID) 25 MCG tablet Take 1 tablet by mouth daily. 03/25/18   [provider]  loperamide (IMODIUM) 2 MG capsule Take by mouth as needed for diarrhea or loose stools.    [provider]  losartan (COZAAR) 50 MG tablet Take 50 mg by mouth daily with breakfast.    [provider]  magnesium hydroxide (MILK OF MAGNESIA) 400 MG/5ML suspension Take 30 mLs by mouth at bedtime as needed for mild constipation.    [provider]  neomycin-bacitracin-polymyxin (NEOSPORIN) ointment Apply 1 application topically as needed for wound care.    [provider]  ondansetron (ZOFRAN ODT) 4 MG disintegrating tablet Take 1 tablet (4 mg total) by mouth every 8 (eight) hours as needed for nausea or vomiting. 04/15/16   Antony MaduraHumes, Kelly, PA-C  polyethylene glycol powder (GLYCOLAX/MIRALAX) powder Take 17 g by mouth 2 (two) times daily. Use for constipation, as prescribed, until daily soft stools  OTC 04/15/16   Antony MaduraHumes, Kelly, PA-C  sertraline (ZOLOFT) 100 MG tablet Take 100 mg by mouth daily with breakfast.     [provider]  tamsulosin (FLOMAX) 0.4 MG CAPS capsule Take 1 capsule (0.4 mg total) by mouth  daily. 04/22/16   Cleora FleetJohnson, Clanford L, MD  temazepam (RESTORIL) 15 MG capsule  03/10/18   [provider]  tiotropium (SPIRIVA) 18 MCG inhalation capsule Place 18 mcg into inhaler and inhale daily.    [provider]  vitamin B-12 (CYANOCOBALAMIN) 1000 MCG tablet Take 1,000 mcg by mouth daily with breakfast.     [provider]  zolpidem (AMBIEN CR) 12.5 MG CR tablet Take 12.5 mg by mouth at bedtime.     [provider]    Family History Family History  Problem Relation Age of Onset  . CVA Mother 2580  . Aneurysm Father     Social History Social History   Tobacco Use  . Smoking status: Former Smoker    Quit date: 2013    Years since quitting: 7.7  . Smokeless tobacco: Never Used  Substance Use Topics  . Alcohol use: No    Comment: h/o heavy ETOH use  . Drug use: No    Types: Cocaine, Marijuana    Comment: h/o drug use     Allergies   Clonidine and Sulfa antibiotics   Review of Systems Review of Systems  Constitutional: Negative for fever.  HENT: Negative for sore throat.   Eyes: Negative for redness.  Respiratory: Negative for shortness of breath.   Cardiovascular: Positive for chest pain. Negative for palpitations and leg swelling.  Gastrointestinal: Negative for abdominal pain.  Genitourinary: Negative for flank pain.  Musculoskeletal: Negative for back pain and neck pain.  Skin: Negative for rash.  Neurological: Negative for headaches.  Hematological: Does not bruise/bleed easily.  Psychiatric/Behavioral: Negative for confusion.     Physical Exam Updated Vital Signs BP 131/66 (BP Location: Right Arm)   Pulse (!) 51   Temp 98.2 F (36.8 C) (Oral)   Resp 20   SpO2 96%   Physical Exam Vitals signs and nursing note reviewed.  Constitutional:      Appearance: Normal appearance. He is well-developed.  HENT:     Head: Atraumatic.     Nose: Nose normal.     Mouth/Throat:     Mouth: Mucous membranes are moist.      Pharynx: Oropharynx is clear.  Eyes:     General: No scleral icterus.    Conjunctiva/sclera: Conjunctivae normal.  Neck:     Musculoskeletal: Normal range of motion and neck supple. No neck rigidity.     Trachea: No tracheal deviation.  Cardiovascular:     Rate and Rhythm: Normal rate and regular rhythm.     Pulses: Normal pulses.     Heart sounds: Normal heart sounds. No murmur. No friction rub. No gallop.   Pulmonary:     Effort: Pulmonary effort is normal. No accessory muscle usage or respiratory distress.     Breath sounds: Normal breath sounds.  Chest:     Chest wall: No tenderness.  Abdominal:     General: Bowel sounds are normal. There is no distension.     Palpations: Abdomen is soft.     Tenderness: There is no abdominal tenderness. There is no guarding.  Genitourinary:    Comments: No cva tenderness. Musculoskeletal:        General: No swelling or tenderness.     Right lower leg: No edema.     Left lower leg: No edema.  Skin:    General: Skin is warm and dry.     Findings: No rash.  Neurological:     Mental Status: He is alert.     Comments: Alert, expressive aphasia and right weakness, c/w baseline.    Psychiatric:        Mood and Affect: Mood normal.      ED Treatments / Results  Labs (all labs ordered are listed, but only abnormal results are displayed) Results for orders placed or performed during the hospital encounter of 02/04/19  Basic metabolic panel  Result Value Ref Range   Sodium 137 135 - 145 mmol/L   Potassium 3.4 (L) 3.5 - 5.1 mmol/L   Chloride 99 98 - 111 mmol/L   CO2 28 22 - 32 mmol/L   Glucose, Bld 62 (L) 70 - 99 mg/dL   BUN 9 8 - 23 mg/dL   Creatinine, Ser 1.61 0.61 - 1.24 mg/dL   Calcium 8.9 8.9 - 09.6 mg/dL   GFR calc non Af Amer >60 >60  mL/min   GFR calc Af Amer >60 >60 mL/min   Anion gap 10 5 - 15  CBC  Result Value Ref Range   WBC 8.5 4.0 - 10.5 K/uL   RBC 5.06 4.22 - 5.81 MIL/uL   Hemoglobin 14.4 13.0 - 17.0 g/dL   HCT 52.844.2  41.339.0 - 24.452.0 %   MCV 87.4 80.0 - 100.0 fL   MCH 28.5 26.0 - 34.0 pg   MCHC 32.6 30.0 - 36.0 g/dL   RDW 01.014.7 27.211.5 - 53.615.5 %   Platelets 237 150 - 400 K/uL   nRBC 0.0 0.0 - 0.2 %  Troponin I (High Sensitivity)  Result Value Ref Range   Troponin I (High Sensitivity) 7 <18 ng/L  Troponin I (High Sensitivity)  Result Value Ref Range   Troponin I (High Sensitivity) 8 <18 ng/L   Dg Chest 2 View  Result Date: 02/04/2019 CLINICAL DATA:  Chest pain, shortness of breath EXAM: CHEST - 2 VIEW COMPARISON:  04/16/2016 FINDINGS: Extensive postoperative changes to the sternum and mediastinum. Cardiomediastinal contours are stable. Calcific aortic knob. Mild bibasilar scarring/atelectasis. Coarsened interstitial markings bilaterally. No focal airspace consolidation. No pleural effusion. No pneumothorax. IMPRESSION: Mildly prominent interstitial markings bilaterally with bibasilar atelectasis. Findings may reflect chronic interstitial lung changes versus an atypical or viral infection. Electronically Signed   By: Duanne GuessNicholas  Plundo M.D.   On: 02/04/2019 14:13    EKG EKG Interpretation  Date/Time:  Saturday February 04 2019 13:33:21 EDT Ventricular Rate:  59 PR Interval:  174 QRS Duration: 82 QT Interval:  454 QTC Calculation: 449 R Axis:   15 Text Interpretation:  Sinus bradycardia with Premature atrial complexes Nonspecific T wave abnormality No significant change since last tracing Confirmed by Cathren LaineSteinl, Javionna Leder ([id number]) on 02/04/2019 3:45:50 PM   Radiology Dg Chest 2 View  Result Date: 02/04/2019 CLINICAL DATA:  Chest pain, shortness of breath EXAM: CHEST - 2 VIEW COMPARISON:  04/16/2016 FINDINGS: Extensive postoperative changes to the sternum and mediastinum. Cardiomediastinal contours are stable. Calcific aortic knob. Mild bibasilar scarring/atelectasis. Coarsened interstitial markings bilaterally. No focal airspace consolidation. No pleural effusion. No pneumothorax. IMPRESSION: Mildly prominent  interstitial markings bilaterally with bibasilar atelectasis. Findings may reflect chronic interstitial lung changes versus an atypical or viral infection. Electronically Signed   By: Duanne GuessNicholas  Plundo M.D.   On: 02/04/2019 14:13    Procedures Procedures (including critical care time)  Medications Ordered in ED Medications - No data to display   Initial Impression / Assessment and Plan / ED Course  I have reviewed the triage vital signs and the nursing notes.  Pertinent labs & imaging results that were available during my care of the patient were reviewed by me and considered in my medical decision making (see chart for details).  Iv ns. Ecg. Stat labs. Cxr.   Reviewed nursing notes and prior charts for additional history.   Cxr reviewed by me - no pna/chr interstitial changes. No current cough or fever.   Labs reviewed by me  - chem normal x k sl low, 3.4. . Initial trop normal.  KCL po.   Initial glucose mildly low. Po fluids, food.   Recheck pt, no chest pain or discomfort. Breathing comfortably.   Additional labs checked, delta trop normal, and not significantly increased from prior.  Patient currently appears stable for d/c.   Rec cardiology f/u.  Return precautions provided.     Final Clinical Impressions(s) / ED Diagnoses   Final diagnoses:  None    ED Discharge  Orders    None       Cathren Laine, MD 02/04/19 [phone]

## 2019-02-04 NOTE — ED Triage Notes (Signed)
Pt from [facility] with complaint of CP and shob x 1 week. 12 lead NSR, VSS. Pt has expressive aphasia and right sided weakness from previous CVA. Pt's family says that pt was covid positive one month ago and the facility states that he is negative.

## 2019-02-04 NOTE — Discharge Instructions (Addendum)
It was our pleasure to provide your ER care today - we hope that you feel better.  For recent chest discomfort, follow up with cardiologist in 1 week.  From today's labs, your potassium level is slightly low (3.4) - eat plenty of fruits and vegetables, and follow up with your doctor.   Return to ER if worse, new symptoms, recurrent or persistent chest pain, increased difficulty breathing, or other concern.

## 2019-02-23 DIAGNOSIS — E785 Hyperlipidemia, unspecified: Secondary | ICD-10-CM | POA: Insufficient documentation

## 2019-02-23 NOTE — Progress Notes (Addendum)
Cardiology Office Note   Date:  02/24/2019   ID:  Daniel JacobKirk Dimmick, DOB [birth year], MRN [id number]  PCP:  Uvaldo BristleKurth-Bowen, Cornelia, PA-C  Cardiologist:   Rollene RotundaJames Donyelle Enyeart, MD    Chief Complaint  Patient presents with  . Chest Pain      History of Present Illness: Daniel Oconnell is a 63 y.o. male who presents for follow-up of coronary disease. He had a history of bypass surgery in 2001 at [facility].   He did have a stroke in 2001 as well.  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 He had carotid stenting.   He did have COVID in August.  He did not get particularly sick with it.  He was in the emergency room in September and I reviewed these records for this visit.  He had precordial chest pain.  It turns out he has been on oxygen since he was diagnosed with COVID earlier this year.  He does well with this and does not have any chest discomfort if he is wearing his oxygen.  He does not seem at all to be in distress and I am not sure what his saturations are but he is not reporting PND or orthopnea.  [facility].  He has had a stroke but he can still walk without a cane.  He has some right-sided weakness.  He does not bring on chest discomfort with any activities.  He really could not quantify or qualify the chest discomfort because of some dysarthria.  He describes that of a burning discomfort going up his chest.  He does not know what he was somewhat anything he has back in 2001 when he had bypass.  He is not describing palpitations, presyncope or syncope.  He has had no weight gain.  He has some mild chronic lower extremity swelling.   Past Medical History:  Diagnosis Date  . 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 CAD (coronary artery disease) 2001   CABG  . COPD (chronic obstructive pulmonary disease) ([facility])   . CVA (cerebral vascular accident) ([facility]) 2001, 2013   x2  . Expressive aphasia   .  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 Hyperlipidemia   . Hypertension     Past Surgical History:  Procedure Laterality Date  . APPENDECTOMY    . CORONARY ARTERY BYPASS GRAFT  2001  . LUMBAR SPINE SURGERY       Current Outpatient Medications  Medication Sig Dispense Refill  . acetaminophen (TYLENOL) 500 MG tablet Take 500 mg by mouth every 4 (four) hours as needed for mild pain, moderate pain, fever or headache.     . albuterol (PROVENTIL) (2.5 MG/3ML) 0.083% nebulizer solution Take 3 mLs (2.5 mg total) by nebulization every 4 (four) hours as needed for wheezing or shortness of breath. 75 mL 0  . alum & mag hydroxide-simeth ([place]) 200-200-20 MG/5ML suspension Take 30 mLs by mouth as needed for indigestion or heartburn.    Marland Kitchen. amLODipine (NORVASC) 10 MG tablet Take 10 mg by mouth at bedtime.     Marland Kitchen. aspirin 325 MG EC tablet Take 325 mg by mouth daily with breakfast.     . atorvastatin (LIPITOR) 80 MG tablet Take 80 mg by mouth at bedtime.     . baclofen (LIORESAL) 20 MG tablet Take 20 mg by mouth 3 (three) times daily.    .Marland Kitchen  budesonide (PULMICORT) 0.25 MG/2ML nebulizer solution Take 2 mLs (0.25 mg total) by nebulization 2 (two) times daily. 60 mL 0  . cetirizine (ZYRTEC) 10 MG tablet Take 10 mg by mouth daily with breakfast.     . Cholecalciferol (VITAMIN D) 2000 units tablet Take 2,000 Units by mouth daily with breakfast.    . clopidogrel (PLAVIX) 75 MG tablet Take 75 mg by mouth daily with breakfast.     . dicyclomine (BENTYL) 20 MG tablet Take 1 tablet (20 mg total) by mouth 2 (two) times daily. Take for abdominal pain/cramping 20 tablet 0  . guaifenesin (ROBAFEN) 100 MG/5ML syrup Take 200 mg by mouth every 6 (six) hours as needed for cough.    . hydrALAZINE (APRESOLINE) 100 MG tablet Take 100 mg by mouth 2 (two) times daily.    . hydrOXYzine (ATARAX/VISTARIL) 25 MG tablet Take 25 mg by mouth every 6 (six) hours as needed for itching.     . labetalol (NORMODYNE) 200 MG  tablet Take 200 mg by mouth 2 (two) times daily.    Marland Kitchen lactobacillus acidophilus & bulgar (LACTINEX) chewable tablet Chew 1 tablet by mouth daily with breakfast.     . levETIRAcetam (KEPPRA) 750 MG tablet Take 750 mg by mouth 2 (two) times daily.    Marland Kitchen levothyroxine (SYNTHROID, LEVOTHROID) 25 MCG tablet Take 1 tablet by mouth daily.    Marland Kitchen loperamide (IMODIUM) 2 MG capsule Take by mouth as needed for diarrhea or loose stools.    Marland Kitchen losartan (COZAAR) 50 MG tablet Take 50 mg by mouth daily with breakfast.    . magnesium hydroxide (MILK OF MAGNESIA) 400 MG/5ML suspension Take 30 mLs by mouth at bedtime as needed for mild constipation.    Marland Kitchen neomycin-bacitracin-polymyxin (NEOSPORIN) ointment Apply 1 application topically as needed for wound care.    . polyethylene glycol powder (GLYCOLAX/MIRALAX) powder Take 17 g by mouth 2 (two) times daily. Use for constipation, as prescribed, until daily soft stools  OTC 119 g 0  . sertraline (ZOLOFT) 100 MG tablet Take 100 mg by mouth daily with breakfast.     . tamsulosin (FLOMAX) 0.4 MG CAPS capsule Take 1 capsule (0.4 mg total) by mouth daily. 30 capsule 0  . temazepam (RESTORIL) 15 MG capsule     . tiotropium (SPIRIVA) 18 MCG inhalation capsule Place 18 mcg into inhaler and inhale daily.    . vitamin B-12 (CYANOCOBALAMIN) 1000 MCG tablet Take 1,000 mcg by mouth daily with breakfast.     . zolpidem (AMBIEN CR) 12.5 MG CR tablet Take 12.5 mg by mouth at bedtime.     . furosemide (LASIX) 20 MG tablet Take 1 tablet (20 mg total) by mouth daily. 90 tablet 3   No current facility-administered medications for this visit.     Allergies:   Clonidine and Sulfa antibiotics    ROS:  Please see the history of present illness.   Otherwise, review of systems are positive for none.   All other systems are reviewed and negative.    PHYSICAL EXAM: VS:  BP 125/77   Pulse (!) 59   Ht 5\' 10"  (1.778 m)   Wt 187 lb (84.8 kg)   SpO2 94%   BMI 26.83 kg/m  , BMI Body mass  index is 26.83 kg/m.  GENERAL:  Well appearing NECK:  No jugular venous distention, waveform within normal limits, carotid upstroke brisk and symmetric, no bruits, no thyromegaly LUNGS:  Clear to auscultation bilaterally CHEST: Well healed sternotomy scar. HEART:  PMI not displaced or sustained,S1 and S2 within normal limits, no S3, no S4, no clicks, no rubs, no murmurs ABD:  Flat, positive bowel sounds normal in frequency in pitch, no bruits, no rebound, no guarding, no midline pulsatile mass, no hepatomegaly, no splenomegaly EXT:  2 plus pulses throughout, chronic mild right edema, no cyanosis no clubbing. Right radial harvest site intact.  NEURO: Right hemiparesis upper greater than lower extremity   EKG:  EKG  Is not ordered today. Sinus rhythm, rate 59, axis within normal limits, QTC slightly prolonged, lateral T-wave inversions unchanged from previous.  02/04/2019   Recent Labs: 02/04/2019: BUN 9; Creatinine, Ser 1.03; Hemoglobin 14.4; Platelets 237; Potassium 3.4; Sodium 137    Lipid Panel    Component Value Date/Time   CHOL 109 03/10/2017 1451   TRIG 135 03/10/2017 1451   HDL 35 (L) 03/10/2017 1451   CHOLHDL 3.1 03/10/2017 1451   LDLCALC 47 03/10/2017 1451      Wt Readings from Last 3 Encounters:  02/24/19 187 lb (84.8 kg)  04/04/18 180 lb (81.6 kg)  03/10/17 190 lb 3.2 oz (86.3 kg)      Other studies Reviewed: Additional studies/ records that were reviewed today include: ED records Review of the above records demonstrates:  See elsewhere in the note.    ASSESSMENT AND PLAN:  CAD:    Patient had some atypical chest pain.  I did review the emergency room records.  He had negative troponins.  EKG demonstrates some T wave inversions but these are chronic and I compared EKGs.  He says he is no longer having any symptoms.  At this point I do not think further testing is indicated but he knows I would have a low threshold for perfusion imaging if he has any chest  discomfort going forward.  I explained this clearly to the patient and his wife and they understand and agree to let me know if he has any symptoms.   HTN:  The blood pressure is at target.  No change in therapy.   DYSLIPIDEMIA: He had an excellent lipid profile previously.  They can check and see if they have recent labs from her primary provider.  If not to call we should get a fasting lipid profile.   ANKLE EDEMA: This is chronic.  No change in therapy.   CVA: He has follow-up with his neuroradiologist.  Current medicines are reviewed at length with the patient today.  The patient does not have concerns regarding medicines.  The following changes have been made:  None  Labs/ tests ordered today include:  None  No orders of the defined types were placed in this encounter.    Disposition:   FU with me in APP in 4 months.   Signed, Minus Breeding, MD  02/24/2019 12:20 PM    [facility]

## 2019-02-24 ENCOUNTER — Encounter: Payer: Self-pay | Admitting: Cardiology

## 2019-02-24 ENCOUNTER — Ambulatory Visit (INDEPENDENT_AMBULATORY_CARE_PROVIDER_SITE_OTHER): Payer: Medicare HMO | Admitting: Cardiology

## 2019-02-24 ENCOUNTER — Other Ambulatory Visit: Payer: Self-pay

## 2019-02-24 VITALS — BP 125/77 | HR 59 | Ht 70.0 in | Wt 187.0 lb

## 2019-02-24 DIAGNOSIS — E785 Hyperlipidemia, unspecified: Secondary | ICD-10-CM

## 2019-02-24 DIAGNOSIS — I10 Essential (primary) hypertension: Secondary | ICD-10-CM

## 2019-02-24 DIAGNOSIS — M7989 Other specified soft tissue disorders: Secondary | ICD-10-CM

## 2019-02-24 DIAGNOSIS — R9431 Abnormal electrocardiogram [ECG] [EKG]: Secondary | ICD-10-CM

## 2019-02-24 DIAGNOSIS — I2510 Atherosclerotic heart disease of native coronary artery without angina pectoris: Secondary | ICD-10-CM

## 2019-02-24 DIAGNOSIS — I639 Cerebral infarction, unspecified: Secondary | ICD-10-CM

## 2019-02-24 NOTE — Patient Instructions (Addendum)
Medication Instructions:  Your physician recommends that you continue on your current medications as directed. Please refer to the Current Medication list given to you today.  If you need a refill on your cardiac medications before your next appointment, please call your pharmacy.   Lab work: NONE  Testing/Procedures: NONE  Follow-Up: At [facility], you and your health needs are our priority.  As part of our continuing mission to provide you with exceptional heart care, we have created designated Provider Care Teams.  These Care Teams include your primary Cardiologist (physician) and Advanced Practice Providers (APPs -  Physician Assistants and Nurse Practitioners) who all work together to provide you with the care you need, when you need it. You will need a follow up appointment in 12 months.  Please call our office 2 months in advance to schedule this appointment.  You may see Dr. Percival Spanish or one of the following Advanced Practice Providers on your designated Care Team:   Rosaria Ferries, PA-C Jory Sims, DNP, ANP  Any Other Special Instructions Will Be Listed Below (If Applicable). Make an appointment with Rosaria Ferries in 4 months.

## 2019-04-03 ENCOUNTER — Ambulatory Visit: Payer: Medicare HMO | Admitting: Occupational Therapy

## 2019-04-18 ENCOUNTER — Ambulatory Visit: Payer: Medicare HMO | Attending: Physician Assistant | Admitting: Occupational Therapy

## 2019-04-18 ENCOUNTER — Other Ambulatory Visit: Payer: Self-pay

## 2019-04-18 DIAGNOSIS — M25641 Stiffness of right hand, not elsewhere classified: Secondary | ICD-10-CM | POA: Diagnosis present

## 2019-04-18 DIAGNOSIS — I69951 Hemiplegia and hemiparesis following unspecified cerebrovascular disease affecting right dominant side: Secondary | ICD-10-CM | POA: Insufficient documentation

## 2019-04-18 NOTE — Therapy (Signed)
[facility] [address] [place], [place], [address] Phone: [phone]   Fax:  [phone]  Occupational Therapy Evaluation  Patient Details  Name: Daniel Oconnell MRN: [id number] Date of Birth: 14-Jun-1955 Referring Provider (OT): Renaee Munda, [place]   Encounter Date: 04/18/2019  OT End of Session - 04/18/19 1436    Visit Number  1    Number of Visits  6    Date for OT Re-Evaluation  05/25/19    Authorization Type  [employer]    Authorization - Visit Number  1    Authorization - Number of Visits  10    OT Start Time  1235    OT Stop Time  1315    OT Time Calculation (min)  40 min       Past Medical History:  Diagnosis Date  . 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 CAD (coronary artery disease) 2001   CABG  . COPD (chronic obstructive pulmonary disease) (HCC)   . CVA (cerebral vascular accident) (HCC) 2001, 2013   x2  . Expressive aphasia   . 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 Hyperlipidemia   . Hypertension     Past Surgical History:  Procedure Laterality Date  . APPENDECTOMY    . CORONARY ARTERY BYPASS GRAFT  2001  . LUMBAR SPINE SURGERY      There were no vitals filed for this visit.  Subjective Assessment - 04/18/19 1435    Subjective   Pt and caregiver(ex-wife) request updated splint    Patient Stated Goals  splint and HEP    Currently in Pain?  No/denies        [facility] OT Assessment - 04/18/19 0001      Assessment   Medical Diagnosis  CVA    Referring Provider (OT)  Renaee Munda, PA-C    Onset Date/Surgical Date  03/30/19    Prior Therapy  OPOT      Precautions   Precaution Comments  aphasia, sensory impairment      Home  Environment   Family/patient expects to be discharged to:  Assisted living    Lives With  Other (Comment)   ALF     Prior Function   Level of Independence  Independent with basic ADLs      ADL   ADL comments  I  with basic ADLS per ex-wife, however pt occaisionally needs A with cutting food      Cognition   Overall Cognitive Status  --   Pt appears impulsive, significant expressive aphasia     Sensation   Light Touch  Impaired by gross assessment      Coordination   Gross Motor Movements are Fluid and Coordinated  No    Fine Motor Movements are Fluid and Coordinated  No      ROM / Strength   AROM / PROM / Strength  AROM      AROM   Overall AROM   Deficits    Overall AROM Comments  RUE shoulder flexion 85, elbow flexion/ extension: 110/ -15, forearm supination to neutral, grossly 75% composite finger flexion, 90% finger extension with exception of ring finge. Pt has a flexion contracture at PIP joint of right ring finger., and hyperextension present at DIP joint.          Treatment: Therapist initiated fabrication and fitting of resting hand splint, however did not complete due to time constraints.  OT Long Term Goals - 04/18/19 1605      OT LONG TERM GOAL #1   Title  Pt/ caregiver will be I with splint wear, care and precautions    Time  5    Period  Weeks    Status  New      OT LONG TERM GOAL #2   Title  I with updated HEP    Time  5    Period  Weeks    Status  New            Plan - 04/18/19 1439    Clinical Impression Statement  Pt is a 63 y.o male s/p CVA in 2002 and 2013 with residual right hemiplaegia with spasticity. Pt returns to therapy for updated splint and HEP . Pt presents with the following deficits: R hemiplegia, spasticity, aphasia, sensory deficits, decreased RUE functional use which impedes performance of ADLs/ IADLS . Pt can benefit from skilled occupational therapy for updated HEP and  to address splinting needs for improved positioning and to minimize risk of further contracture.    OT Occupational Profile and History  Problem Focused Assessment - Including review of records relating to presenting problem    Occupational  performance deficits (Please refer to evaluation for details):  ADL's;IADL's;Leisure;Social Participation    Body Structure / Function / Physical Skills  ADL;UE functional use;Flexibility;FMC;ROM;GMC;Sensation;IADL;Decreased knowledge of use of DME;Dexterity;Tone    Rehab Potential  Good    Clinical Decision Making  Several treatment options, min-mod task modification necessary   due to aphasia and spasticity   Comorbidities Affecting Occupational Performance:  May have comorbidities impacting occupational performance    Modification or Assistance to Complete Evaluation   Min-Moderate modification of tasks or assist with assess necessary to complete eval    OT Frequency  --   2x week x 1 week followed by 1x week for 4 weeks   OT Treatment/Interventions  Self-care/ADL training;Patient/family education;DME and/or AE instruction;Passive range of motion;Fluidtherapy;Splinting;Therapeutic activities;Manual Therapy;Therapeutic exercise;Moist Heat;Neuromuscular education;Paraffin    Plan  complete resting hand splint and educate pt and caregiver in application       Patient will benefit from skilled therapeutic intervention in order to improve the following deficits and impairments:   Body Structure / Function / Physical Skills: ADL, UE functional use, Flexibility, FMC, ROM, GMC, Sensation, IADL, Decreased knowledge of use of DME, Dexterity, Tone       Visit Diagnosis: Stiffness of right hand, not elsewhere classified  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 Cerebrovascular accident (CVA) (HCC) 02/24/2019  . Dyslipidemia 02/23/2019  . Leg swelling 03/10/2017  . Medication management 03/10/2017  . Hyperlipidemia 03/10/2017  . Adrenal abnormality (HCC)   . Expressive aphasia   . Acute respiratory failure with hypoxia (HCC)  04/17/2016  . Abdominal pain, acute, right lower quadrant 04/17/2016  . Nonspecific abnormal electrocardiogram (ECG) (EKG) 04/17/2016  . H/O: CVA (cerebrovascular accident) 04/17/2016  . COPD (chronic obstructive pulmonary disease) (HCC) 04/17/2016  . CAD (coronary artery disease) 04/17/2016  . Essential hypertension 04/17/2016  . Right lower quadrant abdominal pain   . HCAP (healthcare-associated pneumonia) 04/16/2016    , 04/18/2019, 4:07 PM Keene BreathKathryn , OTR/L [phone] Phone: [phone] 4:08 PM 04/18/19 [facility] [address] [place], [place], [address] Phone: [phone]   Fax:  [phone]  Name: Sohil Timko MRN: [id number] Date of Birth: 01-31-1956

## 2019-04-20 ENCOUNTER — Ambulatory Visit: Payer: Medicare HMO | Admitting: Occupational Therapy

## 2019-04-20 ENCOUNTER — Other Ambulatory Visit: Payer: Self-pay

## 2019-04-20 DIAGNOSIS — M25641 Stiffness of right hand, not elsewhere classified: Secondary | ICD-10-CM | POA: Diagnosis not present

## 2019-04-20 DIAGNOSIS — I69951 Hemiplegia and hemiparesis following unspecified cerebrovascular disease affecting right dominant side: Secondary | ICD-10-CM

## 2019-04-20 NOTE — Therapy (Signed)
[facility] [address] [place], [place], [address] Phone: [phone]   Fax:  [phone]  Occupational Therapy Treatment  Patient Details  Name: Daniel Oconnell MRN: [id number] Date of Birth: July 09, 1955 Referring Provider (OT): Renaee Munda, [place]   Encounter Date: 04/20/2019  OT End of Session - 04/20/19 1307    Visit Number  2    Number of Visits  6    Date for OT Re-Evaluation  05/25/19    Authorization Type  [employer]    Authorization - Visit Number  2    Authorization - Number of Visits  10    OT Start Time  1105    OT Stop Time  1145    OT Time Calculation (min)  40 min    Activity Tolerance  Patient tolerated treatment well    Behavior During Therapy  Impulsive       Past Medical History:  Diagnosis Date  . 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 CAD (coronary artery disease) 2001   CABG  . COPD (chronic obstructive pulmonary disease) (HCC)   . CVA (cerebral vascular accident) (HCC) 2001, 2013   x2  . Expressive aphasia   . 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 Hyperlipidemia   . Hypertension     Past Surgical History:  Procedure Laterality Date  . APPENDECTOMY    . CORONARY ARTERY BYPASS GRAFT  2001  . LUMBAR SPINE SURGERY      There were no vitals filed for this visit.  Subjective Assessment - 04/20/19 1307    Subjective   Pt and caregiver(ex-wife) request updated splint    Patient Stated Goals  splint and HEP    Currently in Pain?  No/denies                   Treatment: Therapist continued fabrication and fitting of resting hand splint. Due to severity of pt's spasticity and pt's sensory deficits fitting required increased time. (Pt's caregiver requested padding the splint due to pt's impaired sensation.) Pt applied splint partially x 1, however he required mod v.c for finger positioning. Pt can benefit from additional padding at  wrist and finger straps and practice with donning/ doffing splint for improved carryover. Pt and ex-wife verbalized understanding.             OT Long Term Goals - 04/18/19 1605      OT LONG TERM GOAL #1   Title  Pt/ caregiver will be I with splint wear, care and precautions    Time  5    Period  Weeks    Status  New      OT LONG TERM GOAL #2   Title  I with updated HEP    Time  5    Period  Weeks    Status  New            Plan - 04/20/19 1307    Clinical Impression Statement  Therapist continued fabrication and fitting of custom resting hand splint. Pt lives in an ALF with minimal caregiver assistance. Pt will need additional practice with donning and doffing splint for safety.    OT Occupational Profile and History  Problem Focused Assessment - Including review of records relating to presenting problem    Occupational performance deficits (Please refer to evaluation for details):  ADL's;IADL's;Leisure;Social Participation    Body Structure / Function / Physical Skills  ADL;UE functional use;Flexibility;FMC;ROM;GMC;Sensation;IADL;Decreased  knowledge of use of DME;Dexterity;Tone    Rehab Potential  Good    Clinical Decision Making  Several treatment options, min-mod task modification necessary   due to aphasia and spasticity   Comorbidities Affecting Occupational Performance:  May have comorbidities impacting occupational performance    Modification or Assistance to Complete Evaluation   Min-Moderate modification of tasks or assist with assess necessary to complete eval    OT Frequency  --   2x week x 1 week followed by 1x week for 4 weeks   OT Treatment/Interventions  Self-care/ADL training;Patient/family education;DME and/or AE instruction;Passive range of motion;Fluidtherapy;Splinting;Therapeutic activities;Manual Therapy;Therapeutic exercise;Moist Heat;Neuromuscular education;Paraffin    Plan  complete resting hand splint strapping, reinforce to prevent cracking if  time and educate pt and caregiver in application    Consulted and Agree with Plan of Care  Patient;Family member/caregiver    Family Member Consulted  ex-wife       Patient will benefit from skilled therapeutic intervention in order to improve the following deficits and impairments:   Body Structure / Function / Physical Skills: ADL, UE functional use, Flexibility, FMC, ROM, GMC, Sensation, IADL, Decreased knowledge of use of DME, Dexterity, Tone       Visit Diagnosis: Stiffness of right hand, not elsewhere classified  Spastic hemiplegia of right dominant side as late effect of cerebrovascular disease, unspecified cerebrovascular disease type [facility])    Problem List Patient Active Problem List   Diagnosis Date Noted  . Cerebrovascular accident (CVA) ([place]) 02/24/2019  . Dyslipidemia 02/23/2019  . Leg swelling 03/10/2017  . Medication management 03/10/2017  . Hyperlipidemia 03/10/2017  . Adrenal abnormality ([place])   . Expressive aphasia   . Acute respiratory failure with hypoxia ([place]) 04/17/2016  . Abdominal pain, acute, right lower quadrant 04/17/2016  . Nonspecific abnormal electrocardiogram (ECG) (EKG) 04/17/2016  . H/O: CVA (cerebrovascular accident) 04/17/2016  . COPD (chronic obstructive pulmonary disease) ([place]) 04/17/2016  . CAD (coronary artery disease) 04/17/2016  . Essential hypertension 04/17/2016  . Right lower quadrant abdominal pain   . HCAP (healthcare-associated pneumonia) 04/16/2016    RINE,KATHRYN 04/20/2019, 1:10 PM  [place] [address] [place], [place], [address] Phone: [phone]   Fax:  [phone]  Name: Daniel Oconnell MRN: [id number] Date of Birth: August 14, 1955

## 2019-04-25 ENCOUNTER — Ambulatory Visit: Payer: Medicare HMO | Admitting: Occupational Therapy

## 2019-04-25 ENCOUNTER — Other Ambulatory Visit: Payer: Self-pay

## 2019-04-25 DIAGNOSIS — I69951 Hemiplegia and hemiparesis following unspecified cerebrovascular disease affecting right dominant side: Secondary | ICD-10-CM

## 2019-04-25 DIAGNOSIS — M25641 Stiffness of right hand, not elsewhere classified: Secondary | ICD-10-CM

## 2019-04-25 NOTE — Therapy (Signed)
[place] [address] [place] [address] [place], [place], [address] Phone: [phone]   Fax:  [phone]  Occupational Therapy Treatment  Patient Details  Name: Lamoyne Hessel MRN: [id number] Date of Birth: [birth year] Referring Provider (OT): Raelyn Number, [place]   Encounter Date: 04/25/2019  OT End of Session - 04/25/19 1410    Visit Number  3    Number of Visits  6    Date for OT Re-Evaluation  05/25/19    Authorization Type  [employer]    Authorization - Visit Number  3    Authorization - Number of Visits  10    OT Start Time  2505    OT Stop Time  1400    OT Time Calculation (min)  45 min    Activity Tolerance  Patient tolerated treatment well    Behavior During Therapy  Impulsive       Past Medical History:  Diagnosis Date  . 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 CAD (coronary artery disease) 2001   CABG  . COPD (chronic obstructive pulmonary disease) ([place])   . CVA (cerebral vascular accident) ([place]) 2001, 2013   x2  . Expressive aphasia   . 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 Hyperlipidemia   . Hypertension     Past Surgical History:  Procedure Laterality Date  . APPENDECTOMY    . CORONARY ARTERY BYPASS GRAFT  2001  . LUMBAR SPINE SURGERY      There were no vitals filed for this visit.  Subjective Assessment - 04/25/19 1409    Subjective   Thank you (per caregiver). Pt is aphasic but appeared pleased with final result of splint    Currently in Pain?  No/denies        Made adjustments to splint, added reinforcement piece to prolong life of splint, added different strapping to wrist and added padding to finger straps. Cut extra strapping and hook adhesive and issued.  Pt also practiced donning/doffing x 2. Issued splint w/ extra strapping and issued detailed wear and care instructions for ALF per caregiver request.                    OT  Education - 04/25/19 1409    Education Details  splint wear and care    Person(s) Educated  Patient;Caregiver(s)    Methods  Explanation;Demonstration;Handout    Comprehension  Verbalized understanding;Returned demonstration          OT Long Term Goals - 04/25/19 1410      OT LONG TERM GOAL #1   Title  Pt/ caregiver will be I with splint wear, care and precautions    Time  5    Period  Weeks    Status  On-going      OT LONG TERM GOAL #2   Title  I with updated HEP    Time  5    Period  Weeks    Status  New            Plan - 04/25/19 1411    Clinical Impression Statement  Pt issued new resting hand splint today. Pt practiced donning/doffing x 2.    Occupational performance deficits (Please refer to evaluation for details):  ADL's;IADL's;Leisure;Social Participation    Body Structure / Function / Physical Skills  ADL;UE functional use;Flexibility;FMC;ROM;GMC;Sensation;IADL;Decreased knowledge of use of DME;Dexterity;Tone    Rehab Potential  Good    OT Frequency  --  2x/wk for 1 week followed by 1x/wk for 4 weeks   OT Treatment/Interventions  Self-care/ADL training;Patient/family education;DME and/or AE instruction;Passive range of motion;Fluidtherapy;Splinting;Therapeutic activities;Manual Therapy;Therapeutic exercise;Moist Heat;Neuromuscular education;Paraffin    Plan  assess splint, initiate HEP    Consulted and Agree with Plan of Care  Patient;Family member/caregiver    Family Member Consulted  ex-wife       Patient will benefit from skilled therapeutic intervention in order to improve the following deficits and impairments:   Body Structure / Function / Physical Skills: ADL, UE functional use, Flexibility, FMC, ROM, GMC, Sensation, IADL, Decreased knowledge of use of DME, Dexterity, Tone       Visit Diagnosis: Spastic hemiplegia of right dominant side as late effect of cerebrovascular disease, unspecified cerebrovascular disease type (HCC)  Stiffness of right  hand, not elsewhere classified    Problem List Patient Active Problem List   Diagnosis Date Noted  . Cerebrovascular accident (CVA) (HCC) 02/24/2019  . Dyslipidemia 02/23/2019  . Leg swelling 03/10/2017  . Medication management 03/10/2017  . Hyperlipidemia 03/10/2017  . Adrenal abnormality (HCC)   . Expressive aphasia   . Acute respiratory failure with hypoxia (HCC) 04/17/2016  . Abdominal pain, acute, right lower quadrant 04/17/2016  . Nonspecific abnormal electrocardiogram (ECG) (EKG) 04/17/2016  . H/O: CVA (cerebrovascular accident) 04/17/2016  . COPD (chronic obstructive pulmonary disease) (HCC) 04/17/2016  . CAD (coronary artery disease) 04/17/2016  . Essential hypertension 04/17/2016  . Right lower quadrant abdominal pain   . HCAP (healthcare-associated pneumonia) 04/16/2016    Kelli Churn, OTR/L 04/25/2019, 2:12 PM  [facility] [address] [place], [place], [address] Phone: [phone]   Fax:  [phone]  Name: Labradford Schnitker MRN: [id number] Date of Birth: [birth year]

## 2019-04-25 NOTE — Patient Instructions (Signed)
Your Splint This splint should initially be fitted by a healthcare practitioner.  The healthcare practitioner is responsible for providing wearing instructions and precautions to the patient, other healthcare practitioners and care provider involved in the patient's care.  This splint was custom made for you. Please read the following instructions to learn about wearing and caring for your splint.  Precautions Should your splint cause any of the following problems, remove the splint immediately and contact your therapist/physician.  Swelling  Severe Pain  Pressure Areas  Stiffness  Numbness  Do not wear your splint while operating machinery unless it has been fabricated for that purpose.  When To Wear Your Splint Where your splint according to your therapist/physician instructions. BEGIN BY WEARING SPLINT FOR 1 HOUR, REMOVE AND MONITOR SKIN FOR ANY PRESSURE SORES. GENERAL REDNESS IS NORMAL AND SHOULD FADE WITHIN 20 MINUTES. KEEP OFF FOR 1 HOUR, THEN IF NO PROBLEMS, PUT BACK ON FOR 2 HOURS. CONTINUE ADDING 1 HOUR ON DURING THE DAY UNTIL HE CAN TOLERATE 5 CONSECUTIVES HOURS. THIS MAY TAKE 2-3 DAYS. THEN, [place] CAN WEAR AT NIGHT.   Care and Cleaning of Your Splint 1. Keep your splint away from open flames. 2. Your splint will lose its shape in temperatures over 135 degrees Farenheit, ( in car windows, near radiators, ovens or in hot water).  Never make any adjustments to your splint, if the splint needs adjusting remove it and make an appointment to see your therapist. 3. Your splint, including the cushion liner may be cleaned with rubbing alcohol (or alcohol wipes).  Do not immerse in hot water over 135 degrees Farenheit.

## 2019-04-28 ENCOUNTER — Encounter

## 2019-05-02 ENCOUNTER — Other Ambulatory Visit: Payer: Self-pay

## 2019-05-02 ENCOUNTER — Ambulatory Visit: Payer: Medicare HMO | Admitting: Occupational Therapy

## 2019-05-02 DIAGNOSIS — I69951 Hemiplegia and hemiparesis following unspecified cerebrovascular disease affecting right dominant side: Secondary | ICD-10-CM

## 2019-05-02 DIAGNOSIS — M25641 Stiffness of right hand, not elsewhere classified: Secondary | ICD-10-CM

## 2019-05-02 NOTE — Patient Instructions (Addendum)
To stretch the middle joint of the ring finger of left hand pt should gently stretch the middle joint of ring finger by applying pressure at the joint to perform a gentle stretch, he should not press hand down against table 10 reps 2x day  Elbow: Flexion   Use other hand to bend elbow with thumb toward the same shoulder. Do NOT force this motion. Hold _10___ seconds. Repeat _5-10___ times. Do _2___ sessions per day. CAUTION: Movement should be gentle, steady and slow.        Pronation (Passive)   Keep elbow bent at right angle and held firmly to side. Use other hand to turn forearm until palm faces downward. Hold __10__ seconds. Repeat __5-10__ times. Do __2_ sessions per day.    SHOULDER: Flexion On Table    Place hands on table, elbows straight. Move hips away from body. Press hands down into table. Hold _3__ seconds. 10___ reps per set, __2_ sets per day, __5_ days per week   Copyright  [employer]. All rights reserved.    PROM: Wrist Flexion / Extension   Grasp  hand and slowly bend wrist until stretch is felt. Relax. Then stretch as far as possible in opposite direction. Be sure to keep elbow bent.  Hold __10__ sec. each way Repeat _5___ times per set.    Do _4-6___ sessions per day.

## 2019-05-02 NOTE — Therapy (Signed)
[facility] [address] [place], [place], [address] Phone: [phone]   Fax:  [phone]  Occupational Therapy Treatment  Patient Details  Name: Daniel Oconnell MRN: [id number] Date of Birth: 03-10-1956 Referring Provider (OT): Raelyn Number, [place]   Encounter Date: 05/02/2019  OT End of Session - 05/02/19 1222    Visit Number  4    Number of Visits  6    Date for OT Re-Evaluation  05/25/19    Authorization Type  [employer]    Authorization - Visit Number  4    Authorization - Number of Visits  10    OT Start Time  9798    OT Stop Time  1145    OT Time Calculation (min)  40 min    Activity Tolerance  Patient tolerated treatment well    Behavior During Therapy  Impulsive       Past Medical History:  Diagnosis Date  . 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 CAD (coronary artery disease) 2001   CABG  . COPD (chronic obstructive pulmonary disease) ([place])   . CVA (cerebral vascular accident) ([place]) 2001, 2013   x2  . Expressive aphasia   . 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 Hyperlipidemia   . Hypertension     Past Surgical History:  Procedure Laterality Date  . APPENDECTOMY    . CORONARY ARTERY BYPASS GRAFT  2001  . LUMBAR SPINE SURGERY      There were no vitals filed for this visit.  Subjective Assessment - 05/02/19 1225    Subjective   Pt requests new single strap at fingers    Currently in Pain?  No/denies          Treatment:Minor adjustment to splint. Pt requests a single finger strap instead of bifurcated strap. Therapist modified and added padding.                  OT Education - 05/02/19 1221    Education Details  HEP, correct application of finger strapping for splint    Person(s) Educated  Patient;Caregiver(s)   ex wife   Methods  Explanation;Demonstration;Verbal cues;Tactile cues;Handout    Comprehension  Verbalized  understanding;Returned demonstration;Verbal cues required;Tactile cues required          OT Long Term Goals - 04/25/19 1410      OT LONG TERM GOAL #1   Title  Pt/ caregiver will be I with splint wear, care and precautions    Time  5    Period  Weeks    Status  On-going      OT LONG TERM GOAL #2   Title  I with updated HEP    Time  5    Period  Weeks    Status  New            Plan - 05/02/19 1222    Clinical Impression Statement  Pt requested that bifurcated strapping be changed to a single strap. Therapist modified and educated pt and ex-wife in application.    Occupational performance deficits (Please refer to evaluation for details):  ADL's;IADL's;Leisure;Social Participation    Body Structure / Function / Physical Skills  ADL;UE functional use;Flexibility;FMC;ROM;GMC;Sensation;IADL;Decreased knowledge of use of DME;Dexterity;Tone    Rehab Potential  Good    OT Frequency  --   2x/wk for 1 week followed by 1x/wk for 4 weeks   OT Treatment/Interventions  Self-care/ADL training;Patient/family education;DME and/or AE instruction;Passive  range of motion;Fluidtherapy;Splinting;Therapeutic activities;Manual Therapy;Therapeutic exercise;Moist Heat;Neuromuscular education;Paraffin    Plan  review HEP, check on splint    Consulted and Agree with Plan of Care  Patient;Family member/caregiver    Family Member Consulted  ex-wife       Patient will benefit from skilled therapeutic intervention in order to improve the following deficits and impairments:   Body Structure / Function / Physical Skills: ADL, UE functional use, Flexibility, FMC, ROM, GMC, Sensation, IADL, Decreased knowledge of use of DME, Dexterity, Tone       Visit Diagnosis: Stiffness of right hand, not elsewhere classified  Spastic hemiplegia of right dominant side as late effect of cerebrovascular disease, unspecified cerebrovascular disease type [facility])    Problem List Patient Active Problem List    Diagnosis Date Noted  . Cerebrovascular accident (CVA) (HCC) 02/24/2019  . Dyslipidemia 02/23/2019  . Leg swelling 03/10/2017  . Medication management 03/10/2017  . Hyperlipidemia 03/10/2017  . Adrenal abnormality (HCC)   . Expressive aphasia   . Acute respiratory failure with hypoxia (HCC) 04/17/2016  . Abdominal pain, acute, right lower quadrant 04/17/2016  . Nonspecific abnormal electrocardiogram (ECG) (EKG) 04/17/2016  . H/O: CVA (cerebrovascular accident) 04/17/2016  . COPD (chronic obstructive pulmonary disease) (HCC) 04/17/2016  . CAD (coronary artery disease) 04/17/2016  . Essential hypertension 04/17/2016  . Right lower quadrant abdominal pain   . HCAP (healthcare-associated pneumonia) 04/16/2016    Mikhail Hallenbeck 05/02/2019, 12:25 PM  [facility] [address] [place], [place], [address] Phone: [phone]   Fax:  [phone]  Name: Jourdin Connors MRN: [id number] Date of Birth: 1956/03/10

## 2019-05-09 ENCOUNTER — Ambulatory Visit: Payer: Medicare HMO | Admitting: Occupational Therapy

## 2019-05-09 ENCOUNTER — Other Ambulatory Visit: Payer: Self-pay

## 2019-05-09 DIAGNOSIS — M25641 Stiffness of right hand, not elsewhere classified: Secondary | ICD-10-CM

## 2019-05-09 DIAGNOSIS — I69951 Hemiplegia and hemiparesis following unspecified cerebrovascular disease affecting right dominant side: Secondary | ICD-10-CM

## 2019-05-09 NOTE — Patient Instructions (Addendum)
Please encourage Daniel Oconnell to perform these exercises 2x per day. He should wear his splint at night.    To stretch the middle joint of the ring finger of left hand pt should gently stretch the middle joint of ring finger by applying pressure at the joint to perform a gentle stretch, he should not press hand down against table 10 reps 2x day  Elbow: Flexion   Use other hand to bend elbow with thumb toward the same shoulder. Do NOT force this motion. Hold _10___ seconds. Repeat _5-10___ times. Do _2___ sessions per day. CAUTION: Movement should be gentle, steady and slow.        Pronation (Passive)   Keep elbow bent at right angle and held firmly to side. Use other hand to turn forearm until palm faces downward. Hold __10__ seconds. Repeat __5-10__ times. Do __2_ sessions per day.    SHOULDER: Flexion On Table    Place hands on table, elbows straight. Move hips away from body. Press hands down into table. Hold _3__ seconds. 10___ reps per set, __2_ sets per day, __5_ days per week   Copyright  VHI. All rights reserved.    PROM: Wrist Flexion / Extension   Grasp  hand and slowly bend wrist until stretch is felt. Relax. Then stretch as far as possible in opposite direction. Be sure to keep elbow bent.  Hold __10__ sec. each way Repeat _5___ times per set.    Do _4-6___ sessions per day.  Pick up lightweight, non breakable items with your right hand, never anything heavy, sharp or breakable

## 2019-05-09 NOTE — Therapy (Signed)
[facility] [address] [place], [place], [address] Phone: [phone]   Fax:  [phone]  Occupational Therapy Treatment  Patient Details  Name: Daniel Oconnell MRN: [id number] Date of Birth: November 24, 1955 Referring Provider (OT): Raelyn Number, [place]   Encounter Date: 05/09/2019  OT End of Session - 05/09/19 1608    Visit Number  5    Number of Visits  6    Date for OT Re-Evaluation  05/25/19    Authorization Type  [employer]    Authorization - Visit Number  5    Authorization - Number of Visits  10    OT Start Time  1027    OT Stop Time  1059    OT Time Calculation (min)  32 min    Activity Tolerance  Patient tolerated treatment well    Behavior During Therapy  Impulsive       Past Medical History:  Diagnosis Date  . 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 CAD (coronary artery disease) 2001   CABG  . COPD (chronic obstructive pulmonary disease) ([place])   . CVA (cerebral vascular accident) ([place]) 2001, 2013   x2  . Expressive aphasia   . 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 Hyperlipidemia   . Hypertension     Past Surgical History:  Procedure Laterality Date  . APPENDECTOMY    . CORONARY ARTERY BYPASS GRAFT  2001  . LUMBAR SPINE SURGERY      There were no vitals filed for this visit.  Subjective Assessment - 05/09/19 1610    Currently in Pain?  No/denies              Treatment: Reviewed splint application and HEP with pt/ ex-wife. Pt returned demonstration and pt's ex-wife verbalized understanding of all education.             OT Education - 05/09/19 1609    Education Details  reviewed splint application, pt donned x 2, reviewed HEP and encouraged light functional use of RUE.    Person(s) Educated  Associate Professor)   ex wife   Methods  Explanation;Demonstration;Verbal cues;Tactile cues;Handout    Comprehension  Verbalized  understanding;Returned demonstration;Verbal cues required;Tactile cues required          OT Long Term Goals - 05/09/19 1057      OT LONG TERM GOAL #1   Title  Pt/ caregiver will be I with splint wear, care and precautions    Time  5    Period  Weeks    Status  Achieved      OT LONG TERM GOAL #2   Title  I with updated HEP    Time  5    Period  Weeks    Status  Achieved            Plan - 05/09/19 1608    Clinical Impression Statement  Therapsit reviewed all education with pt/ ex-wife, they agree with plans for d/c.    Occupational performance deficits (Please refer to evaluation for details):  ADL's;IADL's;Leisure;Social Participation    Body Structure / Function / Physical Skills  ADL;UE functional use;Flexibility;FMC;ROM;GMC;Sensation;IADL;Decreased knowledge of use of DME;Dexterity;Tone    Rehab Potential  Good    OT Frequency  --   2x/wk for 1 week followed by 1x/wk for 4 weeks   OT Treatment/Interventions  Self-care/ADL training;Patient/family education;DME and/or AE instruction;Passive range of motion;Fluidtherapy;Splinting;Therapeutic activities;Manual Therapy;Therapeutic exercise;Moist Heat;Neuromuscular education;Paraffin    Plan  d/c OT    Consulted and Agree with Plan of Care  Patient;Family member/caregiver    Family Member Consulted  ex-wife       Patient will benefit from skilled therapeutic intervention in order to improve the following deficits and impairments:   Body Structure / Function / Physical Skills: ADL, UE functional use, Flexibility, FMC, ROM, GMC, Sensation, IADL, Decreased knowledge of use of DME, Dexterity, Tone       Visit Diagnosis: Spastic hemiplegia of right dominant side as late effect of cerebrovascular disease, unspecified cerebrovascular disease type (HCC)  Stiffness of right hand, not elsewhere classified   OCCUPATIONAL THERAPY DISCHARGE SUMMARY  Current functional level related to goals / functional outcomes: Pt met long  term goals.   Remaining deficits: Spatic hemiplegia, aphasia, sensory impairment.   Education / Equipment: Pt/ ex-wife were educated regarding splint wear, care and precautions and HEP. Pt/ caregiver verbalize/ demonstrate understanding.  Plan: Patient agrees to discharge.  Patient goals were met. Patient is being discharged due to meeting the stated rehab goals.  ?????      Problem List Patient Active Problem List   Diagnosis Date Noted  . Cerebrovascular accident (CVA) ([facility]) 02/24/2019  . Dyslipidemia 02/23/2019  . Leg swelling 03/10/2017  . Medication management 03/10/2017  . Hyperlipidemia 03/10/2017  . Adrenal abnormality ([place])   . Expressive aphasia   . Acute respiratory failure with hypoxia ([place]) 04/17/2016  . Abdominal pain, acute, right lower quadrant 04/17/2016  . Nonspecific abnormal electrocardiogram (ECG) (EKG) 04/17/2016  . H/O: CVA (cerebrovascular accident) 04/17/2016  . COPD (chronic obstructive pulmonary disease) ([place]) 04/17/2016  . CAD (coronary artery disease) 04/17/2016  . Essential hypertension 04/17/2016  . Right lower quadrant abdominal pain   . HCAP (healthcare-associated pneumonia) 04/16/2016    Faiz Weber 05/09/2019, 4:11 PM Theone Murdoch, OTR/L [phone] Phone: [phone] 4:13 PM 05/09/19 [facility] [address] [place], [place], [address] Phone: [phone]   Fax:  [phone]  Name: Daniel Oconnell MRN: [id number] Date of Birth: 1956-03-21

## 2019-05-17 ENCOUNTER — Ambulatory Visit: Payer: Medicare HMO | Admitting: Occupational Therapy

## 2019-06-16 ENCOUNTER — Ambulatory Visit: Payer: Medicare HMO | Admitting: Physician Assistant

## 2019-06-20 NOTE — Progress Notes (Signed)
Cardiology Office Note   Date:  06/21/2019   ID:  Daniel Oconnell, DOB 05-08-1956, MRN [id number]  PCP:  Uvaldo Bristle, PA-C  Cardiologist:  Dr.Hochrein  CC: Follow Up    History of Present Illness: Daniel Oconnell is a 64 y.o. male who presents for ongoing assessment and management of CAD, with hx of CABG in 2001 through [facility], CVA in 2001 and 01027 with right hemiparesis. He is s/p left carotid stenting. Addition history of COVID-19 in August of 2020, but was not very symptomatic with the exception of chest pressure. He has been on O2 support since his diagnosis, COPD, HTN, and HL.  Daniel Oconnell was last seen by Dr Antoine Poche on 02/24/2019 and had symptoms of atypical chest pain, although not severe. He was continued on his current regimen of statin therapy, ARB, DAPT, labetalol, and told to report any new or worsening symptoms.   He comes today with his sister.  He offers no complaints.  His sister states that he minimizes or does not express any new symptoms, and this is sometimes a problem as he did not let anyone know he was feeling badly until he was admitted.  He is currently feeling much better.  I have pointedly asked him specific symptoms and he denies them.  He lives in a group home now and has frequent family visits through windows in the middle of this pandemic.  Past Medical History:  Diagnosis Date  . 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 CAD (coronary artery disease) 2001   CABG  . COPD (chronic obstructive pulmonary disease) (HCC)   . CVA (cerebral vascular accident) (HCC) 2001, 2013   x2  . Expressive aphasia   . 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 Hyperlipidemia   . Hypertension     Past Surgical History:  Procedure Laterality Date  . APPENDECTOMY    . CORONARY ARTERY BYPASS GRAFT  2001  . LUMBAR SPINE SURGERY       Current Outpatient Medications  Medication Sig Dispense Refill  . acetaminophen (TYLENOL) 500 MG tablet Take  500 mg by mouth every 4 (four) hours as needed for mild pain, moderate pain, fever or headache.     . albuterol (PROVENTIL) (2.5 MG/3ML) 0.083% nebulizer solution Take 3 mLs (2.5 mg total) by nebulization every 4 (four) hours as needed for wheezing or shortness of breath. 75 mL 0  . alum & mag hydroxide-simeth (MINTOX) 200-200-20 MG/5ML suspension Take 30 mLs by mouth as needed for indigestion or heartburn.    Marland Kitchen amLODipine (NORVASC) 10 MG tablet Take 10 mg by mouth at bedtime.     Marland Kitchen aspirin 325 MG EC tablet Take 325 mg by mouth daily with breakfast.     . atorvastatin (LIPITOR) 80 MG tablet Take 80 mg by mouth at bedtime.     . baclofen (LIORESAL) 20 MG tablet Take 20 mg by mouth 3 (three) times daily.    . budesonide (PULMICORT) 0.25 MG/2ML nebulizer solution Take 2 mLs (0.25 mg total) by nebulization 2 (two) times daily. 60 mL 0  . cetirizine (ZYRTEC) 10 MG tablet Take 10 mg by mouth daily with breakfast.     . Cholecalciferol (VITAMIN D) 2000 units tablet Take 2,000 Units by mouth daily with breakfast.    . clopidogrel (PLAVIX) 75 MG tablet Take 75 mg by mouth daily with breakfast.     . dicyclomine (BENTYL) 20 MG tablet Take 1 tablet (20 mg total) by  mouth 2 (two) times daily. Take for abdominal pain/cramping 20 tablet 0  . guaifenesin (ROBAFEN) 100 MG/5ML syrup Take 200 mg by mouth every 6 (six) hours as needed for cough.    . hydrALAZINE (APRESOLINE) 100 MG tablet Take 100 mg by mouth 2 (two) times daily.    . hydrOXYzine (ATARAX/VISTARIL) 25 MG tablet Take 25 mg by mouth every 6 (six) hours as needed for itching.     . labetalol (NORMODYNE) 200 MG tablet Take 200 mg by mouth 2 (two) times daily.    Marland Kitchen lactobacillus acidophilus & bulgar (LACTINEX) chewable tablet Chew 1 tablet by mouth daily with breakfast.     . levETIRAcetam (KEPPRA) 750 MG tablet Take 750 mg by mouth 2 (two) times daily.    Marland Kitchen levothyroxine (SYNTHROID, LEVOTHROID) 25 MCG tablet Take 1 tablet by mouth daily.    Marland Kitchen loperamide  (IMODIUM) 2 MG capsule Take by mouth as needed for diarrhea or loose stools.    Marland Kitchen losartan (COZAAR) 50 MG tablet Take 50 mg by mouth daily with breakfast.    . magnesium hydroxide (MILK OF MAGNESIA) 400 MG/5ML suspension Take 30 mLs by mouth at bedtime as needed for mild constipation.    Marland Kitchen neomycin-bacitracin-polymyxin (NEOSPORIN) ointment Apply 1 application topically as needed for wound care.    . polyethylene glycol powder (GLYCOLAX/MIRALAX) powder Take 17 g by mouth 2 (two) times daily. Use for constipation, as prescribed, until daily soft stools  OTC 119 g 0  . sertraline (ZOLOFT) 100 MG tablet Take 100 mg by mouth daily with breakfast.     . tamsulosin (FLOMAX) 0.4 MG CAPS capsule Take 1 capsule (0.4 mg total) by mouth daily. 30 capsule 0  . temazepam (RESTORIL) 15 MG capsule     . tiotropium (SPIRIVA) 18 MCG inhalation capsule Place 18 mcg into inhaler and inhale daily.    . vitamin B-12 (CYANOCOBALAMIN) 1000 MCG tablet Take 1,000 mcg by mouth daily with breakfast.     . zolpidem (AMBIEN CR) 12.5 MG CR tablet Take 12.5 mg by mouth at bedtime.     . furosemide (LASIX) 20 MG tablet Take 1 tablet (20 mg total) by mouth daily. 90 tablet 3   No current facility-administered medications for this visit.    Allergies:   Clonidine and Sulfa antibiotics    Social History:  The patient  reports that he quit smoking about 8 years ago. He has never used smokeless tobacco. He reports that he does not drink alcohol or use drugs.   Family History:  The patient's family history includes Aneurysm in his father; CVA (age of onset: 41) in his mother.    ROS: All other systems are reviewed and negative. Unless otherwise mentioned in H&P    PHYSICAL EXAM: VS:  BP 129/78   Pulse 62   Temp (!) 97.1 F (36.2 C)   Ht 5\' 10"  (1.778 m)   Wt 191 lb 3.2 oz (86.7 kg)   SpO2 95%   BMI 27.43 kg/m  , BMI Body mass index is 27.43 kg/m. GEN: Well nourished, well developed, in no acute distress HEENT:  normal Neck: no JVD, carotid bruits, or masses Cardiac: RRR; soft 1/6 systolic murmurs, rubs, or gallops,no edema  Respiratory:  Clear to auscultation bilaterally, normal work of breathing GI: soft, nontender, nondistended, + BS MS: no deformity or atrophy Skin: warm and dry, no rash Neuro:  Strength and sensation are intact Psych: euthymic mood, full affect   EKG:  Not completed this  office visit.  Recent Labs: 02/04/2019: BUN 9; Creatinine, Ser 1.03; Hemoglobin 14.4; Platelets 237; Potassium 3.4; Sodium 137    Lipid Panel    Component Value Date/Time   CHOL 109 03/10/2017 1451   TRIG 135 03/10/2017 1451   HDL 35 (L) 03/10/2017 1451   CHOLHDL 3.1 03/10/2017 1451   LDLCALC 47 03/10/2017 1451      Wt Readings from Last 3 Encounters:  06/21/19 191 lb 3.2 oz (86.7 kg)  02/24/19 187 lb (84.8 kg)  04/04/18 180 lb (81.6 kg)      Other studies Reviewed: None   ASSESSMENT AND PLAN:  1. CAD: Hx of CABG. He is without complaints of chest pain. He is has some dyspnea at times but none currently.   2. CVA: S/P left carotid stenting. He has has some dysarthria with some right sided hemiparesis.   3. COPD: Continues on home O2. No new or worsening symptoms.   4. Hypertension: Controlled. Continue amlodipine, hydralazine, labetalol, and losartan,.    Current medicines are reviewed at length with the patient today.    Labs/ tests ordered today include: None  Phill Myron. West Pugh, ANP, AACC   06/21/2019 5:05 PM    [facility] [address] Office [phone] Fax [phone]  Notice: This dictation was prepared with Dragon dictation along with smaller phrase technology. Any transcriptional errors that result from this process are unintentional and may not be corrected upon review.

## 2019-06-21 ENCOUNTER — Ambulatory Visit: Payer: Medicare HMO | Admitting: Adult Health

## 2019-06-21 ENCOUNTER — Other Ambulatory Visit: Payer: Self-pay

## 2019-06-21 ENCOUNTER — Encounter: Payer: Self-pay | Admitting: Adult Health

## 2019-06-21 VITALS — BP 129/78 | HR 62 | Temp 97.1°F | Ht 70.0 in | Wt 191.2 lb

## 2019-06-21 DIAGNOSIS — Z79899 Other long term (current) drug therapy: Secondary | ICD-10-CM | POA: Diagnosis not present

## 2019-06-21 DIAGNOSIS — E785 Hyperlipidemia, unspecified: Secondary | ICD-10-CM

## 2019-06-21 DIAGNOSIS — I10 Essential (primary) hypertension: Secondary | ICD-10-CM | POA: Diagnosis not present

## 2019-06-21 DIAGNOSIS — I2510 Atherosclerotic heart disease of native coronary artery without angina pectoris: Secondary | ICD-10-CM | POA: Diagnosis not present

## 2019-06-21 DIAGNOSIS — J42 Unspecified chronic bronchitis: Secondary | ICD-10-CM

## 2019-06-21 NOTE — Patient Instructions (Signed)
Medication Instructions:  Continue current medications  *If you need a refill on your cardiac medications before your next appointment, please call your pharmacy*  Lab Work: CBC, BMP, Fasting Lipid and Liver in 3 Months  If you have labs (blood work) drawn today and your tests are completely normal, you will receive your results only by: Marland Kitchen MyChart Message (if you have MyChart) OR . A paper copy in the mail If you have any lab test that is abnormal or we need to change your treatment, we will call you to review the results.  Testing/Procedures: None Ordered  Follow-Up: At [facility], you and your health needs are our priority.  As part of our continuing mission to provide you with exceptional heart care, we have created designated Provider Care Teams.  These Care Teams include your primary Cardiologist (physician) and Advanced Practice Providers (APPs -  Physician Assistants and Nurse Practitioners) who all work together to provide you with the care you need, when you need it.  Your next appointment:   6 month(s)  The format for your next appointment:   In Person  Provider:   You may see Sarina Ser, MD or one of the following Advanced Practice Providers on your designated Care Team:    Theodore Demark, PA-C  Joni Reining, DNP, ANP  Cadence Fransico Michael, NP

## 2019-07-25 ENCOUNTER — Telehealth: Payer: Self-pay | Admitting: Cardiology

## 2019-07-25 NOTE — Telephone Encounter (Signed)
Spoke with Asher Muir from [facility] Notified that pt can have labs drawn there, just for them to fax the results to our office at [phone]. Will fax lab slips to their office at [phone]. Verbalized understanding. No other questions or concerns at this time.

## 2019-07-25 NOTE — Telephone Encounter (Signed)
Jamie with [facility] states she would like to be informed of the specific types of lab work that the patient needs to have completed.   Please return call to discuss at [phone].

## 2019-08-13 ENCOUNTER — Other Ambulatory Visit: Payer: Self-pay

## 2019-08-13 ENCOUNTER — Emergency Department (HOSPITAL_COMMUNITY): Payer: Medicare HMO

## 2019-08-13 ENCOUNTER — Encounter (HOSPITAL_COMMUNITY): Payer: Self-pay | Admitting: Emergency Medicine

## 2019-08-13 ENCOUNTER — Observation Stay (HOSPITAL_COMMUNITY)
Admission: EM | Admit: 2019-08-13 | Discharge: 2019-08-15 | Disposition: A | Discharge status: Skilled Nursing Facility | Payer: Medicare HMO | Attending: Internal Medicine | Admitting: Internal Medicine

## 2019-08-13 DIAGNOSIS — Z8616 Personal history of COVID-19: Secondary | ICD-10-CM | POA: Diagnosis not present

## 2019-08-13 DIAGNOSIS — I69320 Aphasia following cerebral infarction: Secondary | ICD-10-CM | POA: Insufficient documentation

## 2019-08-13 DIAGNOSIS — J159 Unspecified bacterial pneumonia: Secondary | ICD-10-CM | POA: Diagnosis not present

## 2019-08-13 DIAGNOSIS — R6883 Chills (without fever): Secondary | ICD-10-CM

## 2019-08-13 DIAGNOSIS — J441 Chronic obstructive pulmonary disease with (acute) exacerbation: Secondary | ICD-10-CM | POA: Diagnosis not present

## 2019-08-13 DIAGNOSIS — I69398 Other sequelae of cerebral infarction: Secondary | ICD-10-CM | POA: Diagnosis not present

## 2019-08-13 DIAGNOSIS — I69390 Apraxia following cerebral infarction: Secondary | ICD-10-CM | POA: Insufficient documentation

## 2019-08-13 DIAGNOSIS — J9621 Acute and chronic respiratory failure with hypoxia: Secondary | ICD-10-CM | POA: Diagnosis present

## 2019-08-13 DIAGNOSIS — Z87891 Personal history of nicotine dependence: Secondary | ICD-10-CM | POA: Insufficient documentation

## 2019-08-13 DIAGNOSIS — R05 Cough: Secondary | ICD-10-CM

## 2019-08-13 DIAGNOSIS — I10 Essential (primary) hypertension: Secondary | ICD-10-CM | POA: Diagnosis not present

## 2019-08-13 DIAGNOSIS — G4733 Obstructive sleep apnea (adult) (pediatric): Secondary | ICD-10-CM | POA: Diagnosis not present

## 2019-08-13 DIAGNOSIS — R0902 Hypoxemia: Secondary | ICD-10-CM

## 2019-08-13 DIAGNOSIS — Z951 Presence of aortocoronary bypass graft: Secondary | ICD-10-CM | POA: Diagnosis not present

## 2019-08-13 DIAGNOSIS — Z7982 Long term (current) use of aspirin: Secondary | ICD-10-CM | POA: Diagnosis not present

## 2019-08-13 DIAGNOSIS — Z79899 Other long term (current) drug therapy: Secondary | ICD-10-CM | POA: Diagnosis not present

## 2019-08-13 DIAGNOSIS — R569 Unspecified convulsions: Secondary | ICD-10-CM | POA: Diagnosis not present

## 2019-08-13 DIAGNOSIS — R0602 Shortness of breath: Secondary | ICD-10-CM

## 2019-08-13 DIAGNOSIS — Z8673 Personal history of transient ischemic attack (TIA), and cerebral infarction without residual deficits: Secondary | ICD-10-CM

## 2019-08-13 DIAGNOSIS — Z7902 Long term (current) use of antithrombotics/antiplatelets: Secondary | ICD-10-CM | POA: Insufficient documentation

## 2019-08-13 DIAGNOSIS — N400 Enlarged prostate without lower urinary tract symptoms: Secondary | ICD-10-CM | POA: Diagnosis not present

## 2019-08-13 DIAGNOSIS — H538 Other visual disturbances: Secondary | ICD-10-CM | POA: Diagnosis not present

## 2019-08-13 DIAGNOSIS — R0789 Other chest pain: Secondary | ICD-10-CM

## 2019-08-13 DIAGNOSIS — Z87898 Personal history of other specified conditions: Secondary | ICD-10-CM

## 2019-08-13 DIAGNOSIS — E785 Hyperlipidemia, unspecified: Secondary | ICD-10-CM | POA: Insufficient documentation

## 2019-08-13 DIAGNOSIS — I2510 Atherosclerotic heart disease of native coronary artery without angina pectoris: Secondary | ICD-10-CM | POA: Diagnosis not present

## 2019-08-13 DIAGNOSIS — Z79890 Hormone replacement therapy (postmenopausal): Secondary | ICD-10-CM | POA: Diagnosis not present

## 2019-08-13 DIAGNOSIS — R059 Cough, unspecified: Secondary | ICD-10-CM

## 2019-08-13 DIAGNOSIS — I69351 Hemiplegia and hemiparesis following cerebral infarction affecting right dominant side: Secondary | ICD-10-CM | POA: Insufficient documentation

## 2019-08-13 DIAGNOSIS — J440 Chronic obstructive pulmonary disease with acute lower respiratory infection: Secondary | ICD-10-CM | POA: Diagnosis not present

## 2019-08-13 DIAGNOSIS — Z66 Do not resuscitate: Secondary | ICD-10-CM | POA: Diagnosis not present

## 2019-08-13 LAB — CBC WITH DIFFERENTIAL/PLATELET
Abs Immature Granulocytes: 0.08 10*3/uL — ABNORMAL HIGH (ref 0.00–0.07)
Basophils Absolute: 0.1 10*3/uL (ref 0.0–0.1)
Basophils Relative: 0 %
Eosinophils Absolute: 0.1 10*3/uL (ref 0.0–0.5)
Eosinophils Relative: 1 %
HCT: 44.8 % (ref 39.0–52.0)
Hemoglobin: 14.3 g/dL (ref 13.0–17.0)
Immature Granulocytes: 0 %
Lymphocytes Relative: 4 %
Lymphs Abs: 0.8 10*3/uL (ref 0.7–4.0)
MCH: 28.7 pg (ref 26.0–34.0)
MCHC: 31.9 g/dL (ref 30.0–36.0)
MCV: 90 fL (ref 80.0–100.0)
Monocytes Absolute: 0.6 10*3/uL (ref 0.1–1.0)
Monocytes Relative: 3 %
Neutro Abs: 16.5 10*3/uL — ABNORMAL HIGH (ref 1.7–7.7)
Neutrophils Relative %: 92 %
Platelets: 194 10*3/uL (ref 150–400)
RBC: 4.98 MIL/uL (ref 4.22–5.81)
RDW: 13.7 % (ref 11.5–15.5)
WBC: 18.1 10*3/uL — ABNORMAL HIGH (ref 4.0–10.5)
nRBC: 0 % (ref 0.0–0.2)

## 2019-08-13 LAB — LACTIC ACID, PLASMA: Lactic Acid, Venous: 1.1 mmol/L (ref 0.5–1.9)

## 2019-08-13 LAB — COMPREHENSIVE METABOLIC PANEL
ALT: 20 U/L (ref 0–44)
AST: 16 U/L (ref 15–41)
Albumin: 3.8 g/dL (ref 3.5–5.0)
Alkaline Phosphatase: 72 U/L (ref 38–126)
Anion gap: 11 (ref 5–15)
BUN: 15 mg/dL (ref 8–23)
CO2: 26 mmol/L (ref 22–32)
Calcium: 8.6 mg/dL — ABNORMAL LOW (ref 8.9–10.3)
Chloride: 106 mmol/L (ref 98–111)
Creatinine, Ser: 1.01 mg/dL (ref 0.61–1.24)
GFR calc Af Amer: >60 mL/min (ref >60)
GFR calc non Af Amer: >60 mL/min (ref >60)
Glucose, Bld: 112 mg/dL — ABNORMAL HIGH (ref 70–99)
Potassium: 3.6 mmol/L (ref 3.5–5.1)
Sodium: 143 mmol/L (ref 135–145)
Total Bilirubin: 1.2 mg/dL (ref 0.3–1.2)
Total Protein: 6.6 g/dL (ref 6.5–8.1)

## 2019-08-13 LAB — BRAIN NATRIURETIC PEPTIDE: B Natriuretic Peptide: 266.7 pg/mL — ABNORMAL HIGH (ref 0.0–100.0)

## 2019-08-13 LAB — PROCALCITONIN: Procalcitonin: <0.1 ng/mL

## 2019-08-13 LAB — TROPONIN I (HIGH SENSITIVITY): Troponin I (High Sensitivity): 5 ng/L (ref <18)

## 2019-08-13 LAB — D-DIMER, QUANTITATIVE: D-Dimer, Quant: 0.39 ug/mL-FEU (ref 0.00–0.50)

## 2019-08-13 MED ORDER — DICYCLOMINE HCL 10 MG PO CAPS
10.0000 mg | ORAL_CAPSULE | Freq: Every day | ORAL | Status: DC
  Administered 2019-08-14 – 2019-08-15 (×2): 10 mg via ORAL
  Filled 2019-08-13 (×2): qty 1

## 2019-08-13 MED ORDER — ALBUTEROL SULFATE HFA 108 (90 BASE) MCG/ACT IN AERS
2.0000 | INHALATION_SPRAY | RESPIRATORY_TRACT | Status: DC | PRN
  Filled 2019-08-13: qty 6.7

## 2019-08-13 MED ORDER — ASPIRIN EC 325 MG PO TBEC
325.0000 mg | DELAYED_RELEASE_TABLET | Freq: Every day | ORAL | Status: DC
  Administered 2019-08-14 – 2019-08-15 (×2): 325 mg via ORAL
  Filled 2019-08-13 (×2): qty 1

## 2019-08-13 MED ORDER — AMLODIPINE BESYLATE 5 MG PO TABS
10.0000 mg | ORAL_TABLET | Freq: Every day | ORAL | Status: DC
  Administered 2019-08-13: 10 mg via ORAL
  Filled 2019-08-13: qty 2

## 2019-08-13 MED ORDER — FUROSEMIDE 20 MG PO TABS
20.0000 mg | ORAL_TABLET | ORAL | Status: DC
  Administered 2019-08-14: 20 mg via ORAL
  Filled 2019-08-13: qty 1

## 2019-08-13 MED ORDER — SODIUM CHLORIDE 0.9 % IV SOLN
500.0000 mg | Freq: Once | INTRAVENOUS | Status: AC
  Administered 2019-08-13: 19:00:00 500 mg via INTRAVENOUS
  Filled 2019-08-13: qty 500

## 2019-08-13 MED ORDER — ALBUTEROL SULFATE (2.5 MG/3ML) 0.083% IN NEBU: 2.5000 mg | INHALATION_SOLUTION | RESPIRATORY_TRACT | Status: DC | PRN

## 2019-08-13 MED ORDER — IPRATROPIUM-ALBUTEROL 0.5-2.5 (3) MG/3ML IN SOLN: 3.0000 mL | Freq: Four times a day (QID) | RESPIRATORY_TRACT | Status: DC

## 2019-08-13 MED ORDER — LEVETIRACETAM 500 MG PO TABS: 750.0000 mg | ORAL_TABLET | Freq: Two times a day (BID) | ORAL | Status: DC

## 2019-08-13 MED ORDER — LABETALOL HCL 200 MG PO TABS
200.0000 mg | ORAL_TABLET | Freq: Two times a day (BID) | ORAL | Status: DC
  Filled 2019-08-13 (×2): qty 1

## 2019-08-13 MED ORDER — SODIUM CHLORIDE 0.9 % IV SOLN
1.0000 g | INTRAVENOUS | Status: DC
  Administered 2019-08-14: 1 g via INTRAVENOUS
  Filled 2019-08-13: qty 10
  Filled 2019-08-13: qty 1

## 2019-08-13 MED ORDER — SODIUM CHLORIDE 0.9 % IV SOLN
500.0000 mg | INTRAVENOUS | Status: DC
  Administered 2019-08-14: 500 mg via INTRAVENOUS
  Filled 2019-08-13 (×2): qty 500

## 2019-08-13 MED ORDER — ATORVASTATIN CALCIUM 40 MG PO TABS
80.0000 mg | ORAL_TABLET | Freq: Every day | ORAL | Status: DC
  Administered 2019-08-13 – 2019-08-14 (×2): 80 mg via ORAL
  Filled 2019-08-13 (×2): qty 2

## 2019-08-13 MED ORDER — LEVETIRACETAM 500 MG PO TABS
750.0000 mg | ORAL_TABLET | Freq: Two times a day (BID) | ORAL | Status: DC
  Administered 2019-08-13 – 2019-08-15 (×4): 750 mg via ORAL
  Filled 2019-08-13 (×5): qty 1

## 2019-08-13 MED ORDER — HYDRALAZINE HCL 50 MG PO TABS
100.0000 mg | ORAL_TABLET | Freq: Two times a day (BID) | ORAL | Status: DC
  Filled 2019-08-13 (×2): qty 2

## 2019-08-13 MED ORDER — ALBUTEROL SULFATE HFA 108 (90 BASE) MCG/ACT IN AERS: 2.0000 | INHALATION_SPRAY | RESPIRATORY_TRACT | Status: DC

## 2019-08-13 MED ORDER — CLOPIDOGREL BISULFATE 75 MG PO TABS
75.0000 mg | ORAL_TABLET | Freq: Every day | ORAL | Status: DC
  Administered 2019-08-14 – 2019-08-15 (×2): 75 mg via ORAL
  Filled 2019-08-13 (×2): qty 1

## 2019-08-13 MED ORDER — SODIUM CHLORIDE 0.9 % IV SOLN
1.0000 g | Freq: Once | INTRAVENOUS | Status: AC
  Administered 2019-08-13: 1 g via INTRAVENOUS
  Filled 2019-08-13: qty 10

## 2019-08-13 MED ORDER — ACETAMINOPHEN 500 MG PO TABS
500.0000 mg | ORAL_TABLET | ORAL | Status: DC | PRN
  Filled 2019-08-13: qty 1

## 2019-08-13 MED ORDER — TRAZODONE HCL 100 MG PO TABS
100.0000 mg | ORAL_TABLET | Freq: Every day | ORAL | Status: DC
  Administered 2019-08-13 – 2019-08-14 (×2): 100 mg via ORAL
  Filled 2019-08-13 (×2): qty 1

## 2019-08-13 MED ORDER — BACLOFEN 10 MG PO TABS
20.0000 mg | ORAL_TABLET | Freq: Three times a day (TID) | ORAL | Status: DC
  Administered 2019-08-13 – 2019-08-15 (×5): 20 mg via ORAL
  Filled 2019-08-13 (×5): qty 2

## 2019-08-13 MED ORDER — SERTRALINE HCL 50 MG PO TABS
100.0000 mg | ORAL_TABLET | Freq: Every day | ORAL | Status: DC
  Administered 2019-08-14 – 2019-08-15 (×2): 100 mg via ORAL
  Filled 2019-08-13 (×2): qty 2

## 2019-08-13 MED ORDER — ENOXAPARIN SODIUM 40 MG/0.4ML ~~LOC~~ SOLN
40.0000 mg | SUBCUTANEOUS | Status: DC
  Administered 2019-08-14 – 2019-08-15 (×2): 40 mg via SUBCUTANEOUS
  Filled 2019-08-13 (×2): qty 0.4

## 2019-08-13 MED ORDER — TAMSULOSIN HCL 0.4 MG PO CAPS
0.4000 mg | ORAL_CAPSULE | Freq: Every day | ORAL | Status: DC
  Administered 2019-08-14 – 2019-08-15 (×2): 0.4 mg via ORAL
  Filled 2019-08-13 (×2): qty 1

## 2019-08-13 MED ORDER — LOSARTAN POTASSIUM 50 MG PO TABS
50.0000 mg | ORAL_TABLET | Freq: Every day | ORAL | Status: DC
  Administered 2019-08-14: 50 mg via ORAL
  Filled 2019-08-13: qty 1

## 2019-08-13 NOTE — ED Notes (Signed)
Reports productive cough x2 days. Patient had both COVID-19 vaccines.

## 2019-08-13 NOTE — ED Provider Notes (Signed)
Early December positive COVID. 80% on home O2 (2L). Got Epi and solumedrol by EMS. Now 90% on 4L. Subjective fever and cough. Physical Exam  BP 127/64 (BP Location: Right Arm)   Pulse 64   Temp 98.9 F (37.2 C) (Oral)   Resp 18   Ht 5\' 9"  (1.753 m)   SpO2 95%   BMI 28.24 kg/m   Physical Exam Patient is alert but has significant aphasia.  He does answer yes and no to questions.  He can make short sentences.  He also makes gestures.  Patient is tachypneic but not severely distressed.  He has paroxysmal episodes of fairly harsh cough.  There are expiratory wheeze from the lower lung field down and occasional rhonchi as well as crackle.  No peripheral edema or calf tenderness. ED Course/Procedures     Procedures  MDM  Consult: Dr. for admission.  Had long conversation with the patient's wife reviewing history of present illness and current findings and plan.  He would like to be able to spend the night with him in the hospital to help with communication due to his aphasia.  This message passed along to nursing.  We will be admitted for early pneumonia versus COPD exacerbation.  He has significant leukocytosis, rhonchi and wheeze.  Started on Rocephin and Zithromax.  Patient got Solu-Medrol from EMS.  Patient had Covid in August and was fully immunized in December.  Should be low risk for Covid pneumonia.       January, MD 08/13/19 [phone]

## 2019-08-13 NOTE — H&P (Addendum)
History and Physical    Daniel Oconnell PIR:[id number] DOB: [birth year] DOA: 08/13/2019  PCP: Daniel Caprice, PA-C  Patient coming from: [facility]  I have personally briefly reviewed patient's old medical records in [place]  Chief Complaint: Shortness of breath, congestion  HPI: Daniel Oconnell is a 64 y.o. 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   Ex-wife at bedside provides most of the history as patient is aphasic and can only answer mostly yes or no questions or write out responses.  She reports that she was told by the facility that between sometimes last night and today he has had a fever, congestion and a decrease appetite.  Patient also reports that he has been having coughing and shortness of breath since last night.  Has had wheezing.  His nasal cannula also fell out at one point. He denies any nausea, vomiting or diarrhea abdominal pain.  Unsure of any sick contact at [facility].  On EMS arrival, he was found to have his nasal cannula was displaced and he had 80% oxygen saturation on room air.  He initially required high flow and and was given IM epi and Solu-Medrol by EMS.  In the ED, he was able to wean down to 4 L and eventually back to his home 2 L following treatment with 2 albuterol nebulizers.   He was noted to have a leukocytosis of around 18, chest x-ray otherwise negative.  However based on symptoms there is concerns of pneumonia and he was started on Rocephin and azithromycin and hospitalist was consulted for admission.  Review of Systems: Constitutional: No Weight Change, + Fever ENT/Mouth: No sore throat, No Rhinorrhea Eyes: No Eye Pain, No Vision Changes Cardiovascular: No Chest Pain, + SOB Respiratory:+ Cough,  + Sputum, + Wheezing, + Dyspnea  Gastrointestinal: No Nausea, No Vomiting, No Diarrhea, No Constipation, No Pain Genitourinary: no Urinary Incontinence Musculoskeletal: No Arthralgias, No Myalgias Skin: No Skin Lesions, No Pruritus, Neuro: no Weakness, No Numbness Psych: No Anxiety/Panic, No Depression, + decrease appetite Heme/Lymph: No Bruising, No Bleeding  Past Medical History:  Diagnosis Date  . 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 CAD (coronary artery disease) 2001   CABG  . COPD (chronic obstructive pulmonary disease) ([place])   . CVA (cerebral vascular accident) ([place]) 2001, 2013   x2  . Expressive aphasia   . 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 Hyperlipidemia   . Hypertension     Past Surgical History:  Procedure Laterality Date  . APPENDECTOMY    . CORONARY ARTERY BYPASS GRAFT  2001  . LUMBAR SPINE SURGERY       reports that he quit smoking about 8 years ago. He has never used smokeless tobacco. He reports that he does not drink alcohol or use drugs.  Allergies  Allergen Reactions  . Clonidine Diarrhea    Not listed on MAR  . Sulfa Antibiotics Other (See Comments)    Unknown; not listed on MAR    Family History  Problem Relation Age of Onset  . CVA Mother 65  . Aneurysm Father      Prior to Admission medications   Medication Sig Start Date End Date Taking? Authorizing Provider  acetaminophen (TYLENOL) 500 MG tablet Take  500 mg by mouth every 4 (four) hours as needed for mild pain, moderate pain, fever or headache.    Yes [provider]  alum & mag hydroxide-simeth (MINTOX) 200-200-20 MG/5ML suspension Take 30 mLs by mouth as needed for indigestion or heartburn.   Yes [provider]  amLODipine (NORVASC) 10 MG tablet Take 10 mg by mouth at bedtime.    Yes [provider]  aspirin 325 MG EC tablet Take 325 mg by mouth daily with breakfast.    Yes [provider]  atorvastatin  (LIPITOR) 80 MG tablet Take 80 mg by mouth at bedtime.    Yes [provider]  baclofen (LIORESAL) 20 MG tablet Take 20 mg by mouth 3 (three) times daily.   Yes [provider]  clopidogrel (PLAVIX) 75 MG tablet Take 75 mg by mouth daily with breakfast.    Yes [provider]  dicyclomine (BENTYL) 10 MG capsule Take 10 mg by mouth daily.   Yes [provider]  furosemide (LASIX) 20 MG tablet Take 20 mg by mouth every other day.   Yes [provider]  guaifenesin (ROBAFEN) 100 MG/5ML syrup Take 200 mg by mouth every 6 (six) hours as needed for cough.   Yes [provider]  hydrALAZINE (APRESOLINE) 100 MG tablet Take 100 mg by mouth 2 (two) times daily.   Yes [provider]  labetalol (NORMODYNE) 200 MG tablet Take 200 mg by mouth 2 (two) times daily.   Yes [provider]  levETIRAcetam (KEPPRA) 750 MG tablet Take 750 mg by mouth 2 (two) times daily.   Yes [provider]  levothyroxine (SYNTHROID, LEVOTHROID) 25 MCG tablet Take 1 tablet by mouth daily. 03/25/18  Yes [provider]  loperamide (IMODIUM) 2 MG capsule Take by mouth as needed for diarrhea or loose stools.   Yes [provider]  losartan (COZAAR) 50 MG tablet Take 50 mg by mouth daily with breakfast.   Yes [provider]  magnesium hydroxide (MILK OF MAGNESIA) 400 MG/5ML suspension Take 30 mLs by mouth at bedtime as needed for mild constipation.   Yes [provider]  neomycin-bacitracin-polymyxin (NEOSPORIN) ointment Apply 1 application topically as needed for wound care.   Yes [provider]  Polyethyl Glycol-Propyl Glycol (SYSTANE ULTRA) 0.4-0.3 % SOLN Apply 1 drop to eye 3 (three) times daily.   Yes [provider]  polyethylene glycol powder (GLYCOLAX/MIRALAX) powder Take 17 g by mouth 2 (two) times daily. Use for constipation, as prescribed, until daily soft stools  OTC Patient taking  differently: Take 17 g by mouth daily as needed for mild constipation or moderate constipation.  04/15/16  Yes Antony Madura, PA-C  sertraline (ZOLOFT) 100 MG tablet Take 100 mg by mouth daily with breakfast.    Yes [provider]  tamsulosin (FLOMAX) 0.4 MG CAPS capsule Take 1 capsule (0.4 mg total) by mouth daily. 04/22/16  Yes Johnson, Clanford L, MD  traZODone (DESYREL) 100 MG tablet Take 100 mg by mouth at bedtime. 06/22/19  Yes [provider]  albuterol (PROVENTIL) (2.5 MG/3ML) 0.083% nebulizer solution Take 3 mLs (2.5 mg total) by nebulization every 4 (four) hours as needed for wheezing or shortness of breath. Patient not taking: Reported on 08/13/2019 04/22/16   Standley Dakins L, MD  budesonide (PULMICORT) 0.25 MG/2ML nebulizer solution Take 2 mLs (0.25 mg total) by nebulization 2 (two) times daily. Patient not taking: Reported on 08/13/2019 04/22/16   Cleora Fleet, MD  dicyclomine (BENTYL) 20 MG tablet Take 1 tablet (20 mg total) by mouth 2 (two) times daily. Take for abdominal pain/cramping Patient not taking: Reported on 08/13/2019 04/15/16   Antony Madura, PA-C  furosemide (LASIX) 20 MG tablet Take 1 tablet (20 mg total) by mouth daily. Patient not taking: Reported on 08/13/2019 03/10/17 06/08/17  Rollene Rotunda, MD    Physical Exam: Vitals:   08/13/19 1545 08/13/19 1645 08/13/19 1730 08/13/19 1845  BP: 130/62 (!) 115/52 122/71 (!) 117/51  Pulse: 61 61 63 68  Resp: (!) 21 (!) 27 20 (!) 21  Temp:  98.9 F (37.2 C)    TempSrc:  Rectal    SpO2: 95% 96% 94% 95%  Height:        Constitutional: NAD, calm, comfortable, well-appearing elderly male sitting upright in bed Vitals:   08/13/19 1545 08/13/19 1645 08/13/19 1730 08/13/19 1845  BP: 130/62 (!) 115/52 122/71 (!) 117/51  Pulse: 61 61 63 68  Resp: (!) 21 (!) 27 20 (!) 21  Temp:  98.9 F (37.2 C)    TempSrc:  Rectal    SpO2: 95% 96% 94% 95%  Height:       Eyes: PERRL, lids and conjunctivae  normal ENMT: Mucous membranes are moist. Posterior pharynx clear of any exudate or lesions.Normal dentition.  Neck: normal, supple Respiratory: Diminished breath sounds throughout with wheezing of the left upper and lower lobes on 2 L via nasal cannula.  Normal respiratory effort. No accessory muscle use.  Cardiovascular: Regular rate and rhythm, no murmurs / rubs / gallops. No extremity edema. 2+ pedal pulses.  Abdomen: no tenderness, no masses palpated.Bowel sounds positive.  Musculoskeletal: no clubbing / cyanosis. No joint deformity upper and lower extremities.  Slight contracture of the right upper extremity. Skin: no rashes, lesions, ulcers. No induration Neurologic: CN 2-12 grossly intact.  Aphasia-patient mostly able to answer yes or no questions and can write out answers.  Sensation intact.  0/5 strength of the right upper extremity due to history of stroke.  5 out of 5 strength of all other extremities. Psychiatric: Normal judgment and insight. Alert and oriented x 3.  Pleasant  mood.     Labs on Admission: I have personally reviewed following labs and imaging studies  CBC: Recent Labs  Lab 08/13/19 1447  WBC 18.1*  NEUTROABS 16.5*  HGB 14.3  HCT 44.8  MCV 90.0  PLT 194   Basic Metabolic Panel: Recent Labs  Lab 08/13/19 1447  NA 143  K 3.6  CL 106  CO2 26  GLUCOSE 112*  BUN 15  CREATININE 1.01  CALCIUM 8.6*   GFR: CrCl cannot be calculated (Unknown ideal weight.). Liver Function Tests: Recent Labs  Lab 08/13/19 1447  AST 16  ALT 20  ALKPHOS 72  BILITOT 1.2  PROT 6.6  ALBUMIN 3.8   No results for input(s): LIPASE, AMYLASE in the last 168 hours. No results for input(s): AMMONIA in the last 168 hours. Coagulation Profile: No results for input(s): INR, PROTIME in the last 168 hours. Cardiac Enzymes: No results for input(s): CKTOTAL, CKMB, CKMBINDEX, TROPONINI in the last 168 hours. BNP (last 3 results) No results for input(s): PROBNP in the last 8760  hours. HbA1C: No results for input(s): HGBA1C in the last 72 hours. CBG: No results for input(s): GLUCAP in the last 168 hours. Lipid Profile: No results for input(s): CHOL, HDL, LDLCALC, TRIG, CHOLHDL, LDLDIRECT in the last 72 hours. Thyroid Function Tests: No results for input(s): TSH, T4TOTAL, FREET4,  T3FREE, THYROIDAB in the last 72 hours. Anemia Panel: No results for input(s): VITAMINB12, FOLATE, FERRITIN, TIBC, IRON, RETICCTPCT in the last 72 hours. Urine analysis:    Component Value Date/Time   COLORURINE AMBER (A) 04/16/2016 1830   APPEARANCEUR CLEAR 04/16/2016 1830   LABSPEC >1.046 (H) 04/16/2016 1830   PHURINE 6.0 04/16/2016 1830   GLUCOSEU NEGATIVE 04/16/2016 1830   HGBUR NEGATIVE 04/16/2016 1830   BILIRUBINUR SMALL (A) 04/16/2016 1830   KETONESUR NEGATIVE 04/16/2016 1830   PROTEINUR 30 (A) 04/16/2016 1830   NITRITE NEGATIVE 04/16/2016 1830   LEUKOCYTESUR NEGATIVE 04/16/2016 1830    Radiological Exams on Admission: DG Chest Portable 1 View  Result Date: 08/13/2019 CLINICAL DATA:  Fever, chills, short of breath, chest tightness EXAM: PORTABLE CHEST 1 VIEW COMPARISON:  02/04/2019 FINDINGS: Single frontal view of the chest demonstrates postsurgical changes from median sternotomy. The cardiac silhouette is stable. There is chronic central vascular congestion and diffuse interstitial prominence. No airspace disease, effusion, or pneumothorax. IMPRESSION: 1. Chronic interstitial lung disease. No acute intrathoracic process. Electronically Signed   By: Sharlet Salina M.D.   On: 08/13/2019 15:26    EKG: Independently reviewed.  Assessment/Plan  Acute on chronic hypoxemic respiratory failure secondary to pneumonia and COPD exacerbation Patient able to wean back down to his baseline 2 L We will continue Rocephin and azithromycin.  We will also obtain PCT to help further evaluate if this could be bacterial or viral or if this could potentially just be a worsening of his COPD  exacerbation. Will do scheduled DuoNebs every 6 Low suspicion for Covid since patient has had an infection back in December and last month has finished his second dose of Covid vaccine.  He also has leukocytosis which is not what we would usually expect with Covid infections.  History of CVA history Patient has a baseline right-sided upper extremity weakness and aphasia Continue aspirin and Plavix Continue baclofen  History of CAD s/p CABG Continue aspirin and Plavix Continue statin  Hypertension Continue amlodipine, Lasix, hydralazine, labetalol and losartan  History of seizure Continue Keppra  OSA Not on CPAP  DVT prophylaxis:.Lovenox Code Status: DNR Family Communication: Plan discussed with patient and ex-wife at bedside  disposition Plan: Assisted living facility with observation Consults called:  Admission status: Observation   Laksh Hinners T Brody Bonneau DO [facility]   If 7PM-7AM, please contact night-coverage [email or web address]   08/13/2019, 8:17 PM   Addendum: Nursing staff was concerned about his numerous antihypertensives with current BP with systolic of 102. Advised okay to hold Labetolol and hydralazine for now. Although based on his current regimen I suspect he normally has blood pressure that is difficult to control. Advised resuming BP meds tomorrow if it remains stable.

## 2019-08-13 NOTE — ED Provider Notes (Signed)
[facility]-EMERGENCY DEPT Provider Note   CSN: [id number] Arrival date & time: 08/13/19  1411     History Chief Complaint  Patient presents with  . Shortness of Breath    Daniel Oconnell is a 64 y.o. male.  The history is provided by the patient and medical records. No language interpreter was used.  Shortness of Breath Severity:  Severe Onset quality:  Gradual Duration:  3 days Timing:  Constant Progression:  Worsening Chronicity:  New Relieved by:  Nothing Worsened by:  Nothing Ineffective treatments:  None tried Associated symptoms: cough, fever (subjective), sputum production and wheezing (per EMS report to nursing)   Associated symptoms: no abdominal pain, no chest pain, no diaphoresis, no headaches and no vomiting   Risk factors: no hx of PE/DVT        Past Medical History:  Diagnosis Date  . 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 CAD (coronary artery disease) 2001   CABG  . COPD (chronic obstructive pulmonary disease) (HCC)   . CVA (cerebral vascular accident) (HCC) 2001, 2013   x2  . Expressive aphasia   . 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 Hyperlipidemia   . Hypertension     Patient Active Problem List   Diagnosis Date Noted  . Cerebrovascular accident (CVA) (HCC) 02/24/2019  . Dyslipidemia 02/23/2019  . Leg swelling 03/10/2017  . Medication management 03/10/2017  . Hyperlipidemia 03/10/2017  . Adrenal abnormality (HCC)   . Expressive aphasia   . Acute respiratory failure with hypoxia (HCC) 04/17/2016  . Abdominal pain, acute, right lower quadrant 04/17/2016  . Nonspecific abnormal electrocardiogram (ECG) (EKG) 04/17/2016  . H/O: CVA (cerebrovascular accident) 04/17/2016  . COPD (chronic obstructive pulmonary disease) (HCC) 04/17/2016  . CAD (coronary artery disease) 04/17/2016  . Essential hypertension 04/17/2016  . Right lower quadrant abdominal pain   . HCAP (healthcare-associated  pneumonia) 04/16/2016    Past Surgical History:  Procedure Laterality Date  . APPENDECTOMY    . CORONARY ARTERY BYPASS GRAFT  2001  . LUMBAR SPINE SURGERY         Family History  Problem Relation Age of Onset  . CVA Mother 38  . Aneurysm Father     Social History   Tobacco Use  . Smoking status: Former Smoker    Quit date: 2013    Years since quitting: 8.2  . Smokeless tobacco: Never Used  Substance Use Topics  . Alcohol use: No    Comment: h/o heavy ETOH use  . Drug use: No    Types: Cocaine, Marijuana    Comment: h/o drug use    Home Medications Prior to Admission medications   Medication Sig Start Date End Date Taking? Authorizing Provider  acetaminophen (TYLENOL) 500 MG tablet Take 500 mg by mouth every 4 (four) hours as needed for mild pain, moderate pain, fever or headache.     [provider]  albuterol (PROVENTIL) (2.5 MG/3ML) 0.083% nebulizer solution Take 3 mLs (2.5 mg total) by nebulization every 4 (four) hours as needed for wheezing or shortness of breath. 04/22/16   Johnson, Clanford L, MD  alum & mag hydroxide-simeth (MINTOX) 200-200-20 MG/5ML suspension Take 30 mLs by mouth as needed for indigestion or heartburn.    [provider]  amLODipine (NORVASC) 10 MG tablet Take 10 mg by mouth at bedtime.     [provider]  aspirin 325 MG EC tablet Take 325 mg by mouth daily with  breakfast.     [provider]  atorvastatin (LIPITOR) 80 MG tablet Take 80 mg by mouth at bedtime.     [provider]  baclofen (LIORESAL) 20 MG tablet Take 20 mg by mouth 3 (three) times daily.    [provider]  budesonide (PULMICORT) 0.25 MG/2ML nebulizer solution Take 2 mLs (0.25 mg total) by nebulization 2 (two) times daily. 04/22/16   Johnson, Clanford L, MD  cetirizine (ZYRTEC) 10 MG tablet Take 10 mg by mouth daily with breakfast.     [provider]  Cholecalciferol (VITAMIN D) 2000 units tablet Take 2,000 Units by  mouth daily with breakfast.    [provider]  clopidogrel (PLAVIX) 75 MG tablet Take 75 mg by mouth daily with breakfast.     [provider]  dicyclomine (BENTYL) 20 MG tablet Take 1 tablet (20 mg total) by mouth 2 (two) times daily. Take for abdominal pain/cramping 04/15/16   Antony MaduraHumes, Kelly, PA-C  furosemide (LASIX) 20 MG tablet Take 1 tablet (20 mg total) by mouth daily. 03/10/17 06/08/17  Rollene RotundaHochrein, James, MD  guaifenesin (ROBAFEN) 100 MG/5ML syrup Take 200 mg by mouth every 6 (six) hours as needed for cough.    [provider]  hydrALAZINE (APRESOLINE) 100 MG tablet Take 100 mg by mouth 2 (two) times daily.    [provider]  hydrOXYzine (ATARAX/VISTARIL) 25 MG tablet Take 25 mg by mouth every 6 (six) hours as needed for itching.     [provider]  labetalol (NORMODYNE) 200 MG tablet Take 200 mg by mouth 2 (two) times daily.    [provider]  lactobacillus acidophilus & bulgar (LACTINEX) chewable tablet Chew 1 tablet by mouth daily with breakfast.     [provider]  levETIRAcetam (KEPPRA) 750 MG tablet Take 750 mg by mouth 2 (two) times daily.    [provider]  levothyroxine (SYNTHROID, LEVOTHROID) 25 MCG tablet Take 1 tablet by mouth daily. 03/25/18   [provider]  loperamide (IMODIUM) 2 MG capsule Take by mouth as needed for diarrhea or loose stools.    [provider]  losartan (COZAAR) 50 MG tablet Take 50 mg by mouth daily with breakfast.    [provider]  magnesium hydroxide (MILK OF MAGNESIA) 400 MG/5ML suspension Take 30 mLs by mouth at bedtime as needed for mild constipation.    [provider]  neomycin-bacitracin-polymyxin (NEOSPORIN) ointment Apply 1 application topically as needed for wound care.    [provider]  polyethylene glycol powder (GLYCOLAX/MIRALAX) powder Take 17 g by mouth 2 (two) times daily. Use for constipation, as prescribed, until daily  soft stools  OTC 04/15/16   Antony MaduraHumes, Kelly, PA-C  sertraline (ZOLOFT) 100 MG tablet Take 100 mg by mouth daily with breakfast.     [provider]  tamsulosin (FLOMAX) 0.4 MG CAPS capsule Take 1 capsule (0.4 mg total) by mouth daily. 04/22/16   Cleora FleetJohnson, Clanford L, MD  temazepam (RESTORIL) 15 MG capsule  03/10/18   [provider]  tiotropium (SPIRIVA) 18 MCG inhalation capsule Place 18 mcg into inhaler and inhale daily.    [provider]  vitamin B-12 (CYANOCOBALAMIN) 1000 MCG tablet Take 1,000 mcg by mouth daily with breakfast.     [provider]  zolpidem (AMBIEN CR) 12.5 MG CR tablet Take 12.5 mg by mouth at bedtime.     [provider]    Allergies    Clonidine and Sulfa antibiotics  Review  of Systems   Review of Systems  Constitutional: Positive for chills, fatigue and fever (subjective). Negative for diaphoresis.  HENT: Positive for congestion.   Eyes: Negative for visual disturbance.  Respiratory: Positive for cough, sputum production, chest tightness, shortness of breath and wheezing (per EMS report to nursing). Negative for stridor.   Cardiovascular: Negative for chest pain, palpitations and leg swelling.  Gastrointestinal: Negative for abdominal pain, constipation, diarrhea, nausea and vomiting.  Genitourinary: Negative for flank pain.  Musculoskeletal: Negative for back pain.  Skin: Negative for wound.  Neurological: Negative for dizziness, light-headedness and headaches.  Psychiatric/Behavioral: Negative for agitation.  All other systems reviewed and are negative.   Physical Exam Updated Vital Signs BP 127/64 (BP Location: Right Arm)   Pulse 64   Temp 98.9 F (37.2 C) (Oral)   Resp 18   Ht 5\' 9"  (1.753 m)   SpO2 95%   BMI 28.24 kg/m   Physical Exam Vitals and nursing note reviewed.  Constitutional:      General: He is not in acute distress.    Appearance: He is well-developed. He is not ill-appearing,  toxic-appearing or diaphoretic.  HENT:     Head: Normocephalic and atraumatic.  Eyes:     Conjunctiva/sclera: Conjunctivae normal.     Pupils: Pupils are equal, round, and reactive to light.  Cardiovascular:     Rate and Rhythm: Normal rate and regular rhythm.     Heart sounds: No murmur.  Pulmonary:     Effort: Pulmonary effort is normal. Tachypnea present. No respiratory distress.     Breath sounds: Rhonchi present. No decreased breath sounds, wheezing or rales.  Chest:     Chest wall: No tenderness.  Abdominal:     Palpations: Abdomen is soft.     Tenderness: There is no abdominal tenderness.  Musculoskeletal:     Cervical back: Neck supple.     Right lower leg: No tenderness. No edema.     Left lower leg: No tenderness. No edema.  Skin:    General: Skin is warm and dry.     Capillary Refill: Capillary refill takes less than 2 seconds.  Neurological:     General: No focal deficit present.     Mental Status: He is alert.  Psychiatric:        Mood and Affect: Mood normal. Mood is not anxious.     ED Results / Procedures / Treatments   Labs (all labs ordered are listed, but only abnormal results are displayed) Labs Reviewed  SARS CORONAVIRUS 2 (TAT 6-24 HRS)  CULTURE, BLOOD (ROUTINE X 2)  CULTURE, BLOOD (ROUTINE X 2)  CBC WITH DIFFERENTIAL/PLATELET  COMPREHENSIVE METABOLIC PANEL  LACTIC ACID, PLASMA  LACTIC ACID, PLASMA  BRAIN NATRIURETIC PEPTIDE  D-DIMER, QUANTITATIVE (NOT AT [facility])  TROPONIN I (HIGH SENSITIVITY)    EKG None ED ECG REPORT   Date: 08/13/2019  Rate: 59  Rhythm: sinus bradycardia  QRS Axis: normal  Intervals: normal  ST/T Wave abnormalities: nonspecific T wave changes  Conduction Disutrbances:none  Narrative Interpretation:   Old EKG Reviewed: unchanged  I have personally reviewed the EKG tracing and agree with the computerized printout as noted.    Radiology DG Chest Portable 1 View  Result Date: 08/13/2019 CLINICAL DATA:  Fever,  chills, short of breath, chest tightness EXAM: PORTABLE CHEST 1 VIEW COMPARISON:  02/04/2019 FINDINGS: Single frontal view of the chest demonstrates postsurgical changes from median sternotomy. The cardiac silhouette is stable. There is chronic central vascular congestion and  diffuse interstitial prominence. No airspace disease, effusion, or pneumothorax. IMPRESSION: 1. Chronic interstitial lung disease. No acute intrathoracic process. Electronically Signed   By: Randa Ngo M.D.   On: 08/13/2019 15:26    Procedures Procedures (including critical care time)  Medications Ordered in ED Medications - No data to display  ED Course  I have reviewed the triage vital signs and the nursing notes.  Pertinent labs & imaging results that were available during my care of the patient were reviewed by me and considered in my medical decision making (see chart for details).    MDM Rules/Calculators/A&P                      Daniel Oconnell is a 64 y.o. male with a past medical history significant for prior stroke with residual speech difficulties and right-sided weakness, CAD status post CABG, hypertension, hyperlipidemia, and COPD on 2 L home oxygen at baseline after Covid infection in December who presents with subjective fevers, chills, productive cough, shortness of breath, chest tightness, and increased oxygen requirement.  According to patient, he is at [place] and due to the worsening shortness of breath called EMS today.  He is normally on 2 L.  EMS reportedly found him with oxygen saturations of 80%.  Patient had wheezing at that point and they gave him 1 to have Solu-Medrol and IM epinephrine.  On my initial evaluation he does not have any wheezing however.  Patient reports that he is still having chest tightness and some chest discomfort.  He reports he does not know any sick contacts and reports this does not feel exactly like his previous Covid infection.  He does report productive cough but no  hemoptysis.  No history of DVT or PE.  No reported leg pain or leg swelling.  No abdominal pain, nausea or vomiting, urinary or GI symptoms.  On exam, patient has rhonchi but no significant wheezing on my exam.  He is hypoxic with oxygen saturations in the upper 80s.  Oxygen requirement increased to 4 L.  Initially was on 6 but we try to get him to 4 to maintain in the mid 90% range.  No murmur.  Abdomen and chest nontender.  Patient has difficulty with speaking but is able to answer questions appropriately.  Right arm and right leg weakness is reportedly at his baseline.  Given his increased oxygen requirement, patient will be evaluated with labs and imaging.  Has I do not hear further wheezing, will hold on further wheezing medications and will continue to watch him after the Solu-Medrol and epi.  We will get a D-dimer and other labs.  This may just be COPD exacerbation that is improved after the interventions with EMS however the patient starts getting worse and hypoxia with increased ox requirement, he may require admission.  Given the increased oxygen requirement and the chest discomfort shortness of breath, anticipate following up on work-up to determine disposition.  As his Covid infection was back in early December, we will repeat a Covid test as has been greater than 3 months since his infection.  Care transferred to oncoming team while awaiting workup results.     Final Clinical Impression(s) / ED Diagnoses Final diagnoses:  Hypoxia  Shortness of breath  Chills  Cough  Chest tightness    Clinical Impression: 1. Hypoxia   2. Shortness of breath   3. Chills   4. Cough   5. Chest tightness     Disposition:  Care transferred to oncoming team while awaiting workup results.   This note was prepared with assistance of Conservation officer, historic buildings. Occasional wrong-word or sound-a-like substitutions may have occurred due to the inherent limitations of voice recognition  software.      Rigley Niess, Canary Brim, MD 08/13/19 1534

## 2019-08-13 NOTE — ED Triage Notes (Signed)
Pt arriving by EMS from [facility]. Staff sent d/t SOB and cough. 2 L [place] at baseline.   EMS reports O2 sat was 80% and [place] was not on when they arrived.   118/60 BP  P 56 CBG 91 98.1 T    3 epi IM 125 mg solu-medrol   Hx dementia and stroke

## 2019-08-14 DIAGNOSIS — J159 Unspecified bacterial pneumonia: Secondary | ICD-10-CM | POA: Diagnosis not present

## 2019-08-14 LAB — CBC
HCT: 42.8 % (ref 39.0–52.0)
Hemoglobin: 14.1 g/dL (ref 13.0–17.0)
MCH: 29.3 pg (ref 26.0–34.0)
MCHC: 32.9 g/dL (ref 30.0–36.0)
MCV: 89 fL (ref 80.0–100.0)
Platelets: 193 10*3/uL (ref 150–400)
RBC: 4.81 MIL/uL (ref 4.22–5.81)
RDW: 13.5 % (ref 11.5–15.5)
WBC: 13.9 10*3/uL — ABNORMAL HIGH (ref 4.0–10.5)
nRBC: 0 % (ref 0.0–0.2)

## 2019-08-14 LAB — SARS CORONAVIRUS 2 (TAT 6-24 HRS): SARS Coronavirus 2: NEGATIVE

## 2019-08-14 LAB — COMPREHENSIVE METABOLIC PANEL
ALT: 20 U/L (ref 0–44)
AST: 18 U/L (ref 15–41)
Albumin: 3.5 g/dL (ref 3.5–5.0)
Alkaline Phosphatase: 68 U/L (ref 38–126)
Anion gap: 9 (ref 5–15)
BUN: 15 mg/dL (ref 8–23)
CO2: 24 mmol/L (ref 22–32)
Calcium: 8.6 mg/dL — ABNORMAL LOW (ref 8.9–10.3)
Chloride: 104 mmol/L (ref 98–111)
Creatinine, Ser: 0.82 mg/dL (ref 0.61–1.24)
GFR calc Af Amer: >60 mL/min (ref >60)
GFR calc non Af Amer: >60 mL/min (ref >60)
Glucose, Bld: 126 mg/dL — ABNORMAL HIGH (ref 70–99)
Potassium: 3.6 mmol/L (ref 3.5–5.1)
Sodium: 137 mmol/L (ref 135–145)
Total Bilirubin: 0.8 mg/dL (ref 0.3–1.2)
Total Protein: 6.5 g/dL (ref 6.5–8.1)

## 2019-08-14 LAB — MRSA PCR SCREENING: MRSA by PCR: NEGATIVE

## 2019-08-14 LAB — HIV ANTIBODY (ROUTINE TESTING W REFLEX): HIV Screen 4th Generation wRfx: NONREACTIVE

## 2019-08-14 MED ORDER — METHYLPREDNISOLONE SODIUM SUCC 40 MG IJ SOLR
40.0000 mg | Freq: Two times a day (BID) | INTRAMUSCULAR | Status: DC
  Administered 2019-08-14 – 2019-08-15 (×3): 40 mg via INTRAVENOUS
  Filled 2019-08-14 (×3): qty 1

## 2019-08-14 MED ORDER — LEVOTHYROXINE SODIUM 25 MCG PO TABS
25.0000 ug | ORAL_TABLET | Freq: Every day | ORAL | Status: DC
  Administered 2019-08-15: 06:00:00 25 ug via ORAL
  Filled 2019-08-14: qty 1

## 2019-08-14 MED ORDER — LABETALOL HCL 100 MG PO TABS
50.0000 mg | ORAL_TABLET | Freq: Two times a day (BID) | ORAL | Status: DC
  Administered 2019-08-14 – 2019-08-15 (×2): 50 mg via ORAL
  Filled 2019-08-14 (×3): qty 0.5

## 2019-08-14 MED ORDER — IPRATROPIUM-ALBUTEROL 0.5-2.5 (3) MG/3ML IN SOLN
3.0000 mL | Freq: Three times a day (TID) | RESPIRATORY_TRACT | Status: DC
  Administered 2019-08-14 – 2019-08-15 (×5): 3 mL via RESPIRATORY_TRACT
  Filled 2019-08-14 (×5): qty 3

## 2019-08-14 NOTE — Progress Notes (Addendum)
PROGRESS NOTE    Daniel Oconnell  WER:[id number] DOB: 04-17-1956 DOA: 08/13/2019 PCP: Jeanette Caprice, PA-C   Brief Narrative: 64 year old male admitted from [facility] assisted living with history of stroke with right-sided residual weakness and aphasia, COPD oxygen dependent on 2 L 24/7, history of CABG, hypertension, history of seizures, obstructive sleep apnea not on CPAP admitted with fever shortness of breath cough and decreased appetite. He is admitted for COPD exacerbation/pneumonia He had Covid in July 2020.  Currently he is Covid negative.  Assessment & Plan:   Principal Problem:   Community acquired bacterial pneumonia Active Problems:   H/O: CVA (cerebrovascular accident)   COPD with acute exacerbation ([place])   CAD (coronary artery disease)   Essential hypertension   History of seizure   OSA (obstructive sleep apnea)  #1 acute on chronic hypoxic respiratory failure secondary to bacterial  pneumonia and COPD exacerbation.  Patient admitted with fever cough shortness of breath from assisted living facility.  He was found to have leukocytosis and soft blood pressure though he is on multiple antihypertensives.  He was started on Rocephin and azithromycin with improvement in his white count and symptoms.  However he is very wheezy this morning I will start him on Solu-Medrol and taper as tolerated. MRSA PCR negative Blood cultures negative Covid - 08/13/2019 Chest x-ray shows chronic interstitial lung disease. His white count has improved to 13.9 from 18.1 on admission with IV antibiotics. Procalcitonin less than 0.10, lactic acid 1.1, BNP 266, troponins negative. Start solumedrol He received Solu-Medrol in the ER. Continue IV antibiotics.  #2 history of stroke with right-sided residual weakness and aphasia on aspirin Plavix and baclofen  #3 history of CAD CABG on aspirin and Plavix and statin  #4 history of essential hypertension on multiple medications amlodipine Lasix  hydralazine labetalol and losartan Blood pressure today 124/69. Hold Lasix and Norvasc Decrease the dose of labetalol hold Cozaar and to hold hydralazine.  #5 history of seizure likely secondary to stroke on Keppra  #6 history of obstructive sleep apnea on CPAP  #7 DNR on admission admitted from [facility] assisted living facility  #8 covid positive in July  #9 BPH on Flomax   Estimated body mass index is 26.79 kg/m as calculated from the following:   Height as of this encounter: 5\' 10"  (1.778 m).   Weight as of this encounter: 84.7 kg.  DVT prophylaxis: Lovenox Code Status: DO NOT RESUSCITATE Family Communication: Discussed with patient's daughter Joycelyn Schmid  disposition Plan: Patient came from assisted living Will return to assisted living Barriers to discharge hypoxia Healthcare associated pneumonia with COPD exacerbation  Consultants:   None  Procedures: None Antimicrobials: Subjective: He is resting in bed aphasic states yes or no to questions and counts fingers to answer questions  Objective: Vitals:   08/14/19 0217 08/14/19 0400 08/14/19 0624 08/14/19 0805  BP: 128/71  124/69   Pulse: 68  67 69  Resp: 19  20 16   Temp: (!) 97.5 F (36.4 C)  98.2 F (36.8 C)   TempSrc: Oral  Oral   SpO2: 92% 96% 92% 93%  Weight:      Height:        Intake/Output Summary (Last 24 hours) at 08/14/2019 0845 Last data filed at 08/14/2019 0507 Gross per 24 hour  Intake --  Output 400 ml  Net -400 ml   Filed Weights   08/13/19 2142  Weight: 84.7 kg    Examination:  General exam: Appears calm and comfortable  Respiratory system wheezing and coarse rhonchi scattered to auscultation. Respiratory effort normal. Cardiovascular system: S1 & S2 heard, RRR. No JVD, murmurs, rubs, gallops or clicks. No pedal edema. Gastrointestinal system: Abdomen is nondistended, soft and nontender. No organomegaly or masses felt. Normal bowel sounds heard. Central nervous system: Alert  and oriented. No focal neurological deficits. Extremities: Symmetric 5 x 5 power. Skin: No rashes, lesions or ulcers Psychiatry: Judgement and insight appear normal. Mood & affect appropriate.     Data Reviewed: I have personally reviewed following labs and imaging studies  CBC: Recent Labs  Lab 08/13/19 1447 08/14/19 0518  WBC 18.1* 13.9*  NEUTROABS 16.5*  --   HGB 14.3 14.1  HCT 44.8 42.8  MCV 90.0 89.0  PLT 194 193   Basic Metabolic Panel: Recent Labs  Lab 08/13/19 1447 08/14/19 0518  NA 143 137  K 3.6 3.6  CL 106 104  CO2 26 24  GLUCOSE 112* 126*  BUN 15 15  CREATININE 1.01 0.82  CALCIUM 8.6* 8.6*   GFR: Estimated Creatinine Clearance: 91.5 mL/min (by C-G formula based on SCr of 0.82 mg/dL). Liver Function Tests: Recent Labs  Lab 08/13/19 1447 08/14/19 0518  AST 16 18  ALT 20 20  ALKPHOS 72 68  BILITOT 1.2 0.8  PROT 6.6 6.5  ALBUMIN 3.8 3.5   No results for input(s): LIPASE, AMYLASE in the last 168 hours. No results for input(s): AMMONIA in the last 168 hours. Coagulation Profile: No results for input(s): INR, PROTIME in the last 168 hours. Cardiac Enzymes: No results for input(s): CKTOTAL, CKMB, CKMBINDEX, TROPONINI in the last 168 hours. BNP (last 3 results) No results for input(s): PROBNP in the last 8760 hours. HbA1C: No results for input(s): HGBA1C in the last 72 hours. CBG: No results for input(s): GLUCAP in the last 168 hours. Lipid Profile: No results for input(s): CHOL, HDL, LDLCALC, TRIG, CHOLHDL, LDLDIRECT in the last 72 hours. Thyroid Function Tests: No results for input(s): TSH, T4TOTAL, FREET4, T3FREE, THYROIDAB in the last 72 hours. Anemia Panel: No results for input(s): VITAMINB12, FOLATE, FERRITIN, TIBC, IRON, RETICCTPCT in the last 72 hours. Sepsis Labs: Recent Labs  Lab 08/13/19 1447 08/13/19 2014  PROCALCITON  --  <0.10  LATICACIDVEN 1.1  --     Recent Results (from the past 240 hour(s))  SARS CORONAVIRUS 2 (TAT 6-24  HRS) Nasopharyngeal Nasopharyngeal Swab     Status: None   Collection Time: 08/13/19  2:48 PM   Specimen: Nasopharyngeal Swab  Result Value Ref Range Status   SARS Coronavirus 2 NEGATIVE NEGATIVE Final    Comment: (NOTE) SARS-CoV-2 target nucleic acids are NOT DETECTED. The SARS-CoV-2 RNA is generally detectable in upper and lower respiratory specimens during the acute phase of infection. Negative results do not preclude SARS-CoV-2 infection, do not rule out co-infections with other pathogens, and should not be used as the sole basis for treatment or other patient management decisions. Negative results must be combined with clinical observations, patient history, and epidemiological information. The expected result is Negative. Fact Sheet for Patients: HairSlick.no Fact Sheet for Healthcare Providers: quierodirigir.com This test is not yet approved or cleared by the [place] FDA and  has been authorized for detection and/or diagnosis of SARS-CoV-2 by FDA under an Emergency Use Authorization (EUA). This EUA will remain  in effect (meaning this test can be used) for the duration of the COVID-19 declaration under Section 56 4(b)(1) of the Act, 21 U.S.C. section 360bbb-3(b)(1), unless the authorization is terminated or  revoked sooner. Performed at [facility], [address] [place], [place] [address]   Blood culture (routine x 2)     Status: None (Preliminary result)   Collection Time: 08/13/19  2:50 PM   Specimen: BLOOD RIGHT HAND  Result Value Ref Range Status   Specimen Description BLOOD RIGHT HAND  Final   Special Requests   Final    BOTTLES DRAWN AEROBIC AND ANAEROBIC Blood Culture results may not be optimal due to an inadequate volume of blood received in culture bottles Performed at [facility], [address], [place], [place] [address]    Culture NO GROWTH < 12 HOURS  Final   Report  Status PENDING  Incomplete  Blood culture (routine x 2)     Status: None (Preliminary result)   Collection Time: 08/13/19  2:55 PM   Specimen: BLOOD  Result Value Ref Range Status   Specimen Description BLOOD RIGHT ANTECUBITAL  Final   Special Requests   Final    BOTTLES DRAWN AEROBIC AND ANAEROBIC Blood Culture results may not be optimal due to an inadequate volume of blood received in culture bottles Performed at [facility], [address] [place], [place] [address]    Culture NO GROWTH < 12 HOURS  Final   Report Status PENDING  Incomplete  MRSA PCR Screening     Status: None   Collection Time: 08/13/19  9:47 PM   Specimen: Nasopharyngeal  Result Value Ref Range Status   MRSA by PCR NEGATIVE NEGATIVE Final    Comment:        The GeneXpert MRSA Assay (FDA approved for NASAL specimens only), is one component of a comprehensive MRSA colonization surveillance program. It is not intended to diagnose MRSA infection nor to guide or monitor treatment for MRSA infections. Performed at [facility], [address] [place], [place] [address]          Radiology Studies: DG Chest Portable 1 View  Result Date: 08/13/2019 CLINICAL DATA:  Fever, chills, short of breath, chest tightness EXAM: PORTABLE CHEST 1 VIEW COMPARISON:  02/04/2019 FINDINGS: Single frontal view of the chest demonstrates postsurgical changes from median sternotomy. The cardiac silhouette is stable. There is chronic central vascular congestion and diffuse interstitial prominence. No airspace disease, effusion, or pneumothorax. IMPRESSION: 1. Chronic interstitial lung disease. No acute intrathoracic process. Electronically Signed   By: Sharlet Salina M.D.   On: 08/13/2019 15:26        Scheduled Meds: . amLODipine  10 mg Oral QHS  . aspirin  325 mg Oral Q breakfast  . atorvastatin  80 mg Oral QHS  . baclofen  20 mg Oral TID  . clopidogrel  75 mg Oral Q breakfast  .  dicyclomine  10 mg Oral Daily  . enoxaparin (LOVENOX) injection  40 mg Subcutaneous Q24H  . furosemide  20 mg Oral QODAY  . hydrALAZINE  100 mg Oral BID  . ipratropium-albuterol  3 mL Nebulization TID  . labetalol  200 mg Oral BID  . levETIRAcetam  750 mg Oral BID  . losartan  50 mg Oral Q breakfast  . sertraline  100 mg Oral Q breakfast  . tamsulosin  0.4 mg Oral Daily  . traZODone  100 mg Oral QHS   Continuous Infusions: . azithromycin    . cefTRIAXone (ROCEPHIN)  IV       LOS: 0 days     Alwyn Ren, MD  08/14/2019, 8:46 AM

## 2019-08-14 NOTE — Progress Notes (Signed)
Pt daughter Francine Graven [facility]) gives permission to speak with patients ex-wife Mauro Arps when she calls, the patient also agrees and gives permission.

## 2019-08-14 NOTE — Evaluation (Signed)
Physical Therapy Evaluation Patient Details Name: Daniel Oconnell MRN: [id number] DOB: 1955-07-17 Today's Date: 08/14/2019   History of Present Illness  64 year old male admitted from [facility] with history of stroke with right-sided residual weakness and expressive aphasia, COPD oxygen dependent on 2 L 24/7, history of CABG, hypertension, history of seizures, obstructive sleep apnea not on CPAP admitted with fever shortness of breath cough and decreased appetite. Pt admitted for COPD exacerbation/pneumonia  Clinical Impression  Pt admitted with above diagnosis.  Pt currently with functional limitations due to the deficits listed below (see PT Problem List). Pt will benefit from skilled PT to increase their independence and safety with mobility to allow discharge to the venue listed below.  Pt ambulates independently to dining hall.  Pt reports he does not have portable oxygen however he is supposed to be on 2L O2 [name] at baseline (only has tubing extension for apt use).  Pt reports he goes to the dining hall or shopping without oxygen and becomes very SOB.  Pt has requested portable oxygen tank he can carry, so he can partake in community without respiratory issues.     Follow Up Recommendations No PT follow up    Equipment Recommendations  None recommended by PT    Recommendations for Other Services       Precautions / Restrictions Precautions Precautions: Fall Precaution Comments: 2L O2 [place] baseline, R UE hemiplegia, expressive aphasia      Mobility  Bed Mobility Overal bed mobility: Needs Assistance Bed Mobility: Supine to Sit     Supine to sit: Min guard;HOB elevated     General bed mobility comments: increased time and effort however pt able to self assist with bed rail  Transfers Overall transfer level: Needs assistance Equipment used: None Transfers: Sit to/from Stand Sit to Stand: Min guard         General transfer comment: min/guard for  safety  Ambulation/Gait Ambulation/Gait assistance: Min guard Gait Distance (Feet): 260 Feet Assistive device: IV Pole Gait Pattern/deviations: Step-through pattern;Decreased stride length     General Gait Details: pt pushed IV pole, SpO2 95% on 2L O2 [name], no overt LOB observed, increased work of breathing and wheezing upon return to room however improved with sitting/rest  Financial trader [place] (Stroke Patients Only)       Balance Overall balance assessment: Mild deficits observed, not formally tested                                           Pertinent Vitals/Pain Pain Assessment: No/denies pain    Home Living Family/patient expects to be discharged to:: Assisted living               Home Equipment: None      Prior Function Level of Independence: Independent               Hand Dominance        Extremity/Trunk Assessment   Upper Extremity Assessment Upper Extremity Assessment: RUE deficits/detail RUE Deficits / Details: hemiplegia from previous CVA    Lower Extremity Assessment Lower Extremity Assessment: RLE deficits/detail RLE Deficits / Details: residual weakness from previous CVA       Communication   Communication: Expressive difficulties  Cognition Arousal/Alertness: Awake/alert Behavior During Therapy: WFL for tasks assessed/performed Overall  Cognitive Status: Within Functional Limits for tasks assessed                                        General Comments      Exercises     Assessment/Plan    PT Assessment Patient needs continued PT services  PT Problem List Decreased strength;Decreased mobility;Decreased activity tolerance;Decreased knowledge of use of DME;Cardiopulmonary status limiting activity       PT Treatment Interventions DME instruction;Therapeutic activities;Gait training;Therapeutic exercise;Functional mobility training;Balance  training;Patient/family education    PT Goals (Current goals can be found in the Care Plan section)  Acute Rehab PT Goals PT Goal Formulation: With patient Time For Goal Achievement: 08/21/19 Potential to Achieve Goals: Good    Frequency Min 3X/week   Barriers to discharge        Co-evaluation               AM-PAC PT "6 Clicks" Mobility  Outcome Measure Help needed turning from your back to your side while in a flat bed without using bedrails?: A Little Help needed moving from lying on your back to sitting on the side of a flat bed without using bedrails?: A Little Help needed moving to and from a bed to a chair (including a wheelchair)?: A Little Help needed standing up from a chair using your arms (e.g., wheelchair or bedside chair)?: A Little Help needed to walk in hospital room?: A Little Help needed climbing 3-5 steps with a railing? : A Little 6 Click Score: 18    End of Session Equipment Utilized During Treatment: Gait belt;Oxygen Activity Tolerance: Patient tolerated treatment well Patient left: in chair;with call bell/phone within reach;with family/visitor present(pt agreeable to use call bell for assist out of recliner)   PT Visit Diagnosis: Difficulty in walking, not elsewhere classified (R26.2)    Time: 7517-0017 PT Time Calculation (min) (ACUTE ONLY): 23 min   Charges:   PT Evaluation $PT Eval Low Complexity: 1 Low         Kati PT, DPT Acute Rehabilitation Services Office: [phone]  Sarajane Jews 08/14/2019, 12:55 PM

## 2019-08-15 DIAGNOSIS — J159 Unspecified bacterial pneumonia: Secondary | ICD-10-CM | POA: Diagnosis not present

## 2019-08-15 LAB — COMPREHENSIVE METABOLIC PANEL
ALT: 20 U/L (ref 0–44)
AST: 20 U/L (ref 15–41)
Albumin: 3.2 g/dL — ABNORMAL LOW (ref 3.5–5.0)
Alkaline Phosphatase: 58 U/L (ref 38–126)
Anion gap: 8 (ref 5–15)
BUN: 15 mg/dL (ref 8–23)
CO2: 27 mmol/L (ref 22–32)
Calcium: 8.3 mg/dL — ABNORMAL LOW (ref 8.9–10.3)
Chloride: 104 mmol/L (ref 98–111)
Creatinine, Ser: 0.85 mg/dL (ref 0.61–1.24)
GFR calc Af Amer: >60 mL/min (ref >60)
GFR calc non Af Amer: >60 mL/min (ref >60)
Glucose, Bld: 137 mg/dL — ABNORMAL HIGH (ref 70–99)
Potassium: 3.7 mmol/L (ref 3.5–5.1)
Sodium: 139 mmol/L (ref 135–145)
Total Bilirubin: 0.6 mg/dL (ref 0.3–1.2)
Total Protein: 6.1 g/dL — ABNORMAL LOW (ref 6.5–8.1)

## 2019-08-15 LAB — CBC
HCT: 41.6 % (ref 39.0–52.0)
Hemoglobin: 13.6 g/dL (ref 13.0–17.0)
MCH: 28.6 pg (ref 26.0–34.0)
MCHC: 32.7 g/dL (ref 30.0–36.0)
MCV: 87.4 fL (ref 80.0–100.0)
Platelets: 221 10*3/uL (ref 150–400)
RBC: 4.76 MIL/uL (ref 4.22–5.81)
RDW: 13.6 % (ref 11.5–15.5)
WBC: 17.2 10*3/uL — ABNORMAL HIGH (ref 4.0–10.5)
nRBC: 0 % (ref 0.0–0.2)

## 2019-08-15 MED ORDER — PREDNISONE 10 MG PO TABS: ORAL_TABLET | ORAL | 0 refills | Status: DC

## 2019-08-15 MED ORDER — AMOXICILLIN-POT CLAVULANATE 875-125 MG PO TABS: 1.0000 | ORAL_TABLET | Freq: Two times a day (BID) | ORAL | 0 refills | Status: AC

## 2019-08-15 NOTE — Progress Notes (Signed)
Patient walked 40ft O2 sat at 87% on room-air and 97% on 2Ls via  nasal cannula.

## 2019-08-15 NOTE — TOC Transition Note (Signed)
Transition of Care [facility]) - CM/SW Discharge Note   Patient Details  Name: Daniel Oconnell MRN: [id number] Date of Birth: 04/06/1956  [facility] of [facility]) CM/SW Contact:  Ida Rogue, LCSW Phone Number: 08/15/2019, 1:25 PM   Clinical Narrative:   Patient    To d/c  Today. Return to [facility].  Needs portable O2.  Called Dr to get orders, [employer] for provision of O2.  Wife will transport back.  Nursing, please call report to [phone].        Barriers to Discharge: No Barriers Identified   Patient Goals and CMS Choice        Discharge Placement                       Discharge Plan and Services                                     Social Determinants of Health (SDOH) Interventions     Readmission Risk Interventions No flowsheet data found.

## 2019-08-15 NOTE — Discharge Summary (Signed)
Physician Discharge Summary  Hammond Obeirne BHA:[id number] DOB: 07-Jun-1955 DOA: 08/13/2019  PCP: Uvaldo Bristle, PA-C  Admit date: 08/13/2019 Discharge date: 08/15/2019  Admitted From: Home Disposition: Home Recommendations for Outpatient Follow-up:  1. Follow up with PCP in 1-2 weeks 2. Please obtain BMP/CBC in one week 3. Please note that I have stopped several of his antihypertensives as his blood pressure was low normal during the hospital stay.  Home Health: None Equipment/Devices: None  Discharge Condition: Stable and improved CODE STATUS DNR  diet recommendation: Cardiac  Brief/Interim Summary: 64 year old male admitted from [facility] with history of stroke with right-sided residual weakness and aphasia, COPD oxygen dependent on 2 L 24/7, history of CABG, hypertension, history of seizures, obstructive sleep apnea not on CPAP admitted with fever shortness of breath cough and decreased appetite. He is admitted for COPD exacerbation/pneumonia He had Covid in July 2020.  Currently he is Covid negative  Discharge Diagnoses:  Principal Problem:   Community acquired bacterial pneumonia Active Problems:   H/O: CVA (cerebrovascular accident)   COPD with acute exacerbation (HCC)   CAD (coronary artery disease)   Essential hypertension   History of seizure   OSA (obstructive sleep apnea)  #1 acute on chronic hypoxic respiratory failure secondary to bacterial  pneumonia and COPD exacerbation.  Patient admitted with fever cough shortness of breath.  He was found to have leukocytosis and soft blood pressure though he is on multiple antihypertensives.  He was started on Rocephin and azithromycin with improvement in his white count and symptoms. MRSA PCR negative Blood cultures negative Covid - 08/13/2019 NEGATIVE Chest x-ray shows chronic interstitial lung disease. We will discharge him on Augmentin and prednisone taper.   #2 history of stroke with right-sided  residual weakness and aphasia on aspirin Plavix and baclofen  #3 history of CAD CABG on aspirin and Plavix and statin  #4 history of essential hypertension on multiple medications amlodipine Lasix hydralazine labetalol and losartan Blood pressure today 124/69. STOP  Lasix and Norvasc and Cozaar hydralazine Continue labetalol   #5 history of seizure likely secondary to stroke on Keppra  #6 history of obstructive sleep apnea on CPAP  #7 DNR on admission admitted from [facility]   Estimated body mass index is 26.79 kg/m as calculated from the following:   Height as of this encounter: 5\' 10"  (1.778 m).   Weight as of this encounter: 84.7 kg.  Discharge Instructions  Discharge Instructions    Diet - low sodium heart healthy   Complete by: As directed    Increase activity slowly   Complete by: As directed      Allergies as of 08/15/2019      Reactions   Clonidine Diarrhea   Not listed on MAR   Sulfa Antibiotics Other (See Comments)   Unknown; not listed on MAR      Medication List    STOP taking these medications   amLODipine 10 MG tablet Commonly known as: NORVASC   budesonide 0.25 MG/2ML nebulizer solution Commonly known as: PULMICORT   furosemide 20 MG tablet Commonly known as: LASIX   losartan 50 MG tablet Commonly known as: COZAAR     TAKE these medications   acetaminophen 500 MG tablet Commonly known as: TYLENOL Take 500 mg by mouth every 4 (four) hours as needed for mild pain, moderate pain, fever or headache.   albuterol (2.5 MG/3ML) 0.083% nebulizer solution Commonly known as: PROVENTIL Take 3 mLs (2.5 mg total) by nebulization  every 4 (four) hours as needed for wheezing or shortness of breath.   aspirin 325 MG EC tablet Take 325 mg by mouth daily with breakfast.   atorvastatin 80 MG tablet Commonly known as: LIPITOR Take 80 mg by mouth at bedtime.   baclofen 20 MG tablet Commonly known as: LIORESAL Take 20 mg  by mouth 3 (three) times daily.   clopidogrel 75 MG tablet Commonly known as: PLAVIX Take 75 mg by mouth daily with breakfast.   dicyclomine 10 MG capsule Commonly known as: BENTYL Take 10 mg by mouth daily. What changed: Another medication with the same name was removed. Continue taking this medication, and follow the directions you see here.   hydrALAZINE 100 MG tablet Commonly known as: APRESOLINE Take 100 mg by mouth 2 (two) times daily.   labetalol 200 MG tablet Commonly known as: NORMODYNE Take 200 mg by mouth 2 (two) times daily.   levETIRAcetam 750 MG tablet Commonly known as: KEPPRA Take 750 mg by mouth 2 (two) times daily.   levothyroxine 25 MCG tablet Commonly known as: SYNTHROID Take 1 tablet by mouth daily.   loperamide 2 MG capsule Commonly known as: IMODIUM Take by mouth as needed for diarrhea or loose stools.   magnesium hydroxide 400 MG/5ML suspension Commonly known as: MILK OF MAGNESIA Take 30 mLs by mouth at bedtime as needed for mild constipation.   Mintox 200-200-20 MG/5ML suspension Generic drug: alum & mag hydroxide-simeth Take 30 mLs by mouth as needed for indigestion or heartburn.   neomycin-bacitracin-polymyxin ointment Commonly known as: NEOSPORIN Apply 1 application topically as needed for wound care.   polyethylene glycol powder 17 GM/SCOOP powder Commonly known as: GLYCOLAX/MIRALAX Take 17 g by mouth 2 (two) times daily. Use for constipation, as prescribed, until daily soft stools  OTC What changed:   when to take this  reasons to take this  additional instructions   Robafen 100 MG/5ML syrup Generic drug: guaifenesin Take 200 mg by mouth every 6 (six) hours as needed for cough.   sertraline 100 MG tablet Commonly known as: ZOLOFT Take 100 mg by mouth daily with breakfast.   Systane Ultra 0.4-0.3 % Soln Generic drug: Polyethyl Glycol-Propyl Glycol Apply 1 drop to eye 3 (three) times daily.   tamsulosin 0.4 MG Caps  capsule Commonly known as: FLOMAX Take 1 capsule (0.4 mg total) by mouth daily.   traZODone 100 MG tablet Commonly known as: DESYREL Take 100 mg by mouth at bedtime.      Follow-up Information    Kurth-Bowen, Cornelia, PA-C Follow up.   Specialty: Physician Assistant Contact information: [address] [place] [address] [phone]          Allergies  Allergen Reactions  . Clonidine Diarrhea    Not listed on MAR  . Sulfa Antibiotics Other (See Comments)    Unknown; not listed on MAR    Consultations: None  Procedures/Studies: DG Chest Portable 1 View  Result Date: 08/13/2019 CLINICAL DATA:  Fever, chills, short of breath, chest tightness EXAM: PORTABLE CHEST 1 VIEW COMPARISON:  02/04/2019 FINDINGS: Single frontal view of the chest demonstrates postsurgical changes from median sternotomy. The cardiac silhouette is stable. There is chronic central vascular congestion and diffuse interstitial prominence. No airspace disease, effusion, or pneumothorax. IMPRESSION: 1. Chronic interstitial lung disease. No acute intrathoracic process. Electronically Signed   By: Sharlet Salina M.D.   On: 08/13/2019 15:26    (Echo, Carotid, EGD, Colonoscopy, ERCP)    Subjective:  Patient is resting  in bed awake and alert back to his baseline in no acute distress on 2 L of oxygen no events overnight Discharge Exam: Vitals:   08/15/19 0514 08/15/19 0735  BP: 139/68   Pulse: 63   Resp: 20   Temp: 98.6 F (37 C)   SpO2: 95% 94%   Vitals:   08/14/19 2034 08/14/19 2200 08/15/19 0514 08/15/19 0735  BP: 123/66  139/68   Pulse: (!) 59 65 63   Resp: 19  20   Temp: 98.4 F (36.9 C)  98.6 F (37 C)   TempSrc: Oral  Oral   SpO2: 94%  95% 94%  Weight:      Height:        General: Pt is alert, awake, not in acute distress Cardiovascular: RRR, S1/S2 +, no rubs, no gallops Respiratory: Scattered rhonchi and wheezing bilaterally, no wheezing, no rhonchi Abdominal: Soft, NT, ND,  bowel sounds + Extremities: no edema, no cyanosis    The results of significant diagnostics from this hospitalization (including imaging, microbiology, ancillary and laboratory) are listed below for reference.     Microbiology: Recent Results (from the past 240 hour(s))  SARS CORONAVIRUS 2 (TAT 6-24 HRS) Nasopharyngeal Nasopharyngeal Swab     Status: None   Collection Time: 08/13/19  2:48 PM   Specimen: Nasopharyngeal Swab  Result Value Ref Range Status   SARS Coronavirus 2 NEGATIVE NEGATIVE Final    Comment: (NOTE) SARS-CoV-2 target nucleic acids are NOT DETECTED. The SARS-CoV-2 RNA is generally detectable in upper and lower respiratory specimens during the acute phase of infection. Negative results do not preclude SARS-CoV-2 infection, do not rule out co-infections with other pathogens, and should not be used as the sole basis for treatment or other patient management decisions. Negative results must be combined with clinical observations, patient history, and epidemiological information. The expected result is Negative. Fact Sheet for Patients: HairSlick.no Fact Sheet for Healthcare Providers: quierodirigir.com This test is not yet approved or cleared by the [place] FDA and  has been authorized for detection and/or diagnosis of SARS-CoV-2 by FDA under an Emergency Use Authorization (EUA). This EUA will remain  in effect (meaning this test can be used) for the duration of the COVID-19 declaration under Section 56 4(b)(1) of the Act, 21 U.S.C. section 360bbb-3(b)(1), unless the authorization is terminated or revoked sooner. Performed at [facility], [address], [place], [place] [address]   Blood culture (routine x 2)     Status: None (Preliminary result)   Collection Time: 08/13/19  2:50 PM   Specimen: BLOOD RIGHT HAND  Result Value Ref Range Status   Specimen Description BLOOD RIGHT HAND  Final    Special Requests   Final    BOTTLES DRAWN AEROBIC AND ANAEROBIC Blood Culture results may not be optimal due to an inadequate volume of blood received in culture bottles Performed at [facility], [address], [place], [place] [address]    Culture NO GROWTH < 12 HOURS  Final   Report Status PENDING  Incomplete  Blood culture (routine x 2)     Status: None (Preliminary result)   Collection Time: 08/13/19  2:55 PM   Specimen: BLOOD  Result Value Ref Range Status   Specimen Description BLOOD RIGHT ANTECUBITAL  Final   Special Requests   Final    BOTTLES DRAWN AEROBIC AND ANAEROBIC Blood Culture results may not be optimal due to an inadequate volume of blood received in culture bottles Performed at  [facility], [place] [address], [place], [place] [address]    Culture NO GROWTH < 12 HOURS  Final   Report Status PENDING  Incomplete  MRSA PCR Screening     Status: None   Collection Time: 08/13/19  9:47 PM   Specimen: Nasopharyngeal  Result Value Ref Range Status   MRSA by PCR NEGATIVE NEGATIVE Final    Comment:        The GeneXpert MRSA Assay (FDA approved for NASAL specimens only), is one component of a comprehensive MRSA colonization surveillance program. It is not intended to diagnose MRSA infection nor to guide or monitor treatment for MRSA infections. Performed at [facility], [place] [address], [place], [place] [address]      Labs: BNP (last 3 results) Recent Labs    08/13/19 1447  BNP 678.9*   Basic Metabolic Panel: Recent Labs  Lab 08/13/19 1447 08/14/19 0518 08/15/19 0545  NA 143 137 139  K 3.6 3.6 3.7  CL 106 104 104  CO2 26 24 27   GLUCOSE 112* 126* 137*  BUN 15 15 15   CREATININE 1.01 0.82 0.85  CALCIUM 8.6* 8.6* 8.3*   Liver Function Tests: Recent Labs  Lab 08/13/19 1447 08/14/19 0518 08/15/19 0545  AST 16 18 20   ALT 20 20 20   ALKPHOS 72 68 58  BILITOT 1.2 0.8 0.6  PROT 6.6 6.5 6.1*   ALBUMIN 3.8 3.5 3.2*   No results for input(s): LIPASE, AMYLASE in the last 168 hours. No results for input(s): AMMONIA in the last 168 hours. CBC: Recent Labs  Lab 08/13/19 1447 08/14/19 0518 08/15/19 0545  WBC 18.1* 13.9* 17.2*  NEUTROABS 16.5*  --   --   HGB 14.3 14.1 13.6  HCT 44.8 42.8 41.6  MCV 90.0 89.0 87.4  PLT 194 193 221   Cardiac Enzymes: No results for input(s): CKTOTAL, CKMB, CKMBINDEX, TROPONINI in the last 168 hours. BNP: Invalid input(s): POCBNP CBG: No results for input(s): GLUCAP in the last 168 hours. D-Dimer Recent Labs    08/13/19 1447  DDIMER 0.39   Hgb A1c No results for input(s): HGBA1C in the last 72 hours. Lipid Profile No results for input(s): CHOL, HDL, LDLCALC, TRIG, CHOLHDL, LDLDIRECT in the last 72 hours. Thyroid function studies No results for input(s): TSH, T4TOTAL, T3FREE, THYROIDAB in the last 72 hours.  Invalid input(s): FREET3 Anemia work up No results for input(s): VITAMINB12, FOLATE, FERRITIN, TIBC, IRON, RETICCTPCT in the last 72 hours. Urinalysis    Component Value Date/Time   COLORURINE AMBER (A) 04/16/2016 1830   APPEARANCEUR CLEAR 04/16/2016 1830   LABSPEC >1.046 (H) 04/16/2016 1830   PHURINE 6.0 04/16/2016 1830   GLUCOSEU NEGATIVE 04/16/2016 1830   HGBUR NEGATIVE 04/16/2016 1830   BILIRUBINUR SMALL (A) 04/16/2016 1830   KETONESUR NEGATIVE 04/16/2016 1830   PROTEINUR 30 (A) 04/16/2016 1830   NITRITE NEGATIVE 04/16/2016 1830   LEUKOCYTESUR NEGATIVE 04/16/2016 1830   Sepsis Labs Invalid input(s): PROCALCITONIN,  WBC,  LACTICIDVEN Microbiology Recent Results (from the past 240 hour(s))  SARS CORONAVIRUS 2 (TAT 6-24 HRS) Nasopharyngeal Nasopharyngeal Swab     Status: None   Collection Time: 08/13/19  2:48 PM   Specimen: Nasopharyngeal Swab  Result Value Ref Range Status   SARS Coronavirus 2 NEGATIVE NEGATIVE Final    Comment: (NOTE) SARS-CoV-2 target nucleic acids are NOT DETECTED. The SARS-CoV-2 RNA is  generally detectable in upper and lower respiratory specimens during the acute phase of infection. Negative results do  not preclude SARS-CoV-2 infection, do not rule out co-infections with other pathogens, and should not be used as the sole basis for treatment or other patient management decisions. Negative results must be combined with clinical observations, patient history, and epidemiological information. The expected result is Negative. Fact Sheet for Patients: HairSlick.nohttps://www.fda.gov/media/138098/download Fact Sheet for Healthcare Providers: quierodirigir.comhttps://www.fda.gov/media/138095/download This test is not yet approved or cleared by the [place] FDA and  has been authorized for detection and/or diagnosis of SARS-CoV-2 by FDA under an Emergency Use Authorization (EUA). This EUA will remain  in effect (meaning this test can be used) for the duration of the COVID-19 declaration under Section 56 4(b)(1) of the Act, 21 U.S.C. section 360bbb-3(b)(1), unless the authorization is terminated or revoked sooner. Performed at [facility], [address] [place], [place] [address]   Blood culture (routine x 2)     Status: None (Preliminary result)   Collection Time: 08/13/19  2:50 PM   Specimen: BLOOD RIGHT HAND  Result Value Ref Range Status   Specimen Description BLOOD RIGHT HAND  Final   Special Requests   Final    BOTTLES DRAWN AEROBIC AND ANAEROBIC Blood Culture results may not be optimal due to an inadequate volume of blood received in culture bottles Performed at [facility], [address] [place], [place] [address]    Culture NO GROWTH < 12 HOURS  Final   Report Status PENDING  Incomplete  Blood culture (routine x 2)     Status: None (Preliminary result)   Collection Time: 08/13/19  2:55 PM   Specimen: BLOOD  Result Value Ref Range Status   Specimen Description BLOOD RIGHT ANTECUBITAL  Final   Special Requests   Final    BOTTLES DRAWN AEROBIC AND  ANAEROBIC Blood Culture results may not be optimal due to an inadequate volume of blood received in culture bottles Performed at [facility], [address] [place], [place] [address]    Culture NO GROWTH < 12 HOURS  Final   Report Status PENDING  Incomplete  MRSA PCR Screening     Status: None   Collection Time: 08/13/19  9:47 PM   Specimen: Nasopharyngeal  Result Value Ref Range Status   MRSA by PCR NEGATIVE NEGATIVE Final    Comment:        The GeneXpert MRSA Assay (FDA approved for NASAL specimens only), is one component of a comprehensive MRSA colonization surveillance program. It is not intended to diagnose MRSA infection nor to guide or monitor treatment for MRSA infections. Performed at [facility], [address] [place], [place] [address]      Time coordinating discharge:  39 minutes  SIGNED:   Alwyn RenElizabeth G Royelle Hinchman, MD  [facility] 08/15/2019, 9:53 AM Pager   If 7PM-7AM, please contact night-coverage [email or web address] Password [email or web address]

## 2019-08-15 NOTE — Discharge Instructions (Signed)
Community-Acquired Pneumonia, Adult Pneumonia is an infection of the lungs. It causes swelling in the airways of the lungs. Mucus and fluid may also build up inside the airways. One type of pneumonia can happen while a person is in a hospital. A different type can happen when a person is not in a hospital (community-acquired pneumonia).  What are the causes?  This condition is caused by germs (viruses, bacteria, or fungi). Some types of germs can be passed from one person to another. This can happen when you breathe in droplets from the cough or sneeze of an infected person. What increases the risk? You are more likely to develop this condition if you:  Have a long-term (chronic) disease, such as: ? Chronic obstructive pulmonary disease (COPD). ? Asthma. ? Cystic fibrosis. ? Congestive heart failure. ? Diabetes. ? Kidney disease.  Have HIV.  Have sickle cell disease.  Have had your spleen removed.  Do not take good care of your teeth and mouth (poor dental hygiene).  Have a medical condition that increases the risk of breathing in droplets from your own mouth and nose.  Have a weakened body defense system (immune system).  Are a smoker.  Travel to areas where the germs that cause this illness are common.  Are around certain animals or the places they live. What are the signs or symptoms?  A dry cough.  A wet (productive) cough.  Fever.  Sweating.  Chest pain. This often happens when breathing deeply or coughing.  Fast breathing or trouble breathing.  Shortness of breath.  Shaking chills.  Feeling tired (fatigue).  Muscle aches. How is this treated? Treatment for this condition depends on many things. Most adults can be treated at home. In some cases, treatment must happen in a hospital. Treatment may include:  Medicines given by mouth or through an IV tube.  Being given extra oxygen.  Respiratory therapy. In rare cases, treatment for very bad pneumonia  may include:  Using a machine to help you breathe.  Having a procedure to remove fluid from around your lungs. Follow these instructions at home: Medicines  Take over-the-counter and prescription medicines only as told by your doctor. ? Only take cough medicine if you are losing sleep.  If you were prescribed an antibiotic medicine, take it as told by your doctor. Do not stop taking the antibiotic even if you start to feel better. General instructions   Sleep with your head and neck raised (elevated). You can do this by sleeping in a recliner or by putting a few pillows under your head.  Rest as needed. Get at least 8 hours of sleep each night.  Drink enough water to keep your pee (urine) pale yellow.  Eat a healthy diet that includes plenty of vegetables, fruits, whole grains, low-fat dairy products, and lean protein.  Do not use any products that contain nicotine or tobacco. These include cigarettes, e-cigarettes, and chewing tobacco. If you need help quitting, ask your doctor.  Keep all follow-up visits as told by your doctor. This is important. How is this prevented? A shot (vaccine) can help prevent pneumonia. Shots are often suggested for:  People older than 65 years of age.  People older than 66 years of age who: ? Are having cancer treatment. ? Have long-term (chronic) lung disease. ? Have problems with their body's defense system. You may also prevent pneumonia if you take these actions:  Get the flu (influenza) shot every year.  Go to the dentist as   often as told.  Wash your hands often. If you cannot use soap and water, use hand sanitizer. Contact a doctor if:  You have a fever.  You lose sleep because your cough medicine does not help. Get help right away if:  You are short of breath and it gets worse.  You have more chest pain.  Your sickness gets worse. This is very serious if: ? You are an older adult. ? Your body's defense system is weak.  You  cough up blood. Summary  Pneumonia is an infection of the lungs.  Most adults can be treated at home. Some will need treatment in a hospital.  Drink enough water to keep your pee pale yellow.  Get at least 8 hours of sleep each night. This information is not intended to replace advice given to you by your health care provider. Make sure you discuss any questions you have with your health care provider. Document Revised: 08/24/2018 Document Reviewed: 12/30/2017 Elsevier Patient Education  2020 [employer]  

## 2019-08-15 NOTE — Progress Notes (Addendum)
SATURATION QUALIFICATIONS: (This note is used to comply with regulatory documentation for home oxygen)  Patient Saturations on Room Air at Rest =92%  Patient Saturations on Room Air while Ambulating = 87%  Patient Saturations on 2Liters of oxygen while Ambulating = 97%  Please briefly explain why patient needs home oxygen: Dyspnea noted while patient was walking on room-air. Patient will need O2 to avoid dyspnea with activities.

## 2019-08-18 LAB — CULTURE, BLOOD (ROUTINE X 2)
Culture: NO GROWTH
Culture: NO GROWTH

## 2020-03-03 NOTE — Progress Notes (Signed)
Cardiology Office Note   Date:  03/05/2020   ID:  Daniel Oconnell, DOB 18-Apr-1956, MRN [id number]  PCP:  Uvaldo Bristle, PA-C  Cardiologist:   Rollene Rotunda, MD    Chief Complaint  Patient presents with  . Coronary Artery Disease      History of Present Illness: Daniel Oconnell is a 64 y.o. male who presents for follow-up of coronary disease. He had a history of bypass surgery in 2001 at [facility].   He did have a stroke in 2001 as well.  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 He had carotid stenting.     He did have COVID in August 2020.   He was admitted in March with COPD exacerbation.  I reviewed these records for this visit.  He lives at a nursing home.  He does walk although he has right hemiparesis.  His weakness is greater in his arm and his leg.  The patient denies any new symptoms such as chest discomfort, neck or arm discomfort. There has been no new shortness of breath, PND or orthopnea. There have been no reported palpitations, presyncope or syncope.  He does sleep in a recliner just because it is more comfortable for him.  He is on oxygen 24/7 2 to 3 L.   Past Medical History:  Diagnosis Date  . 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 CAD (coronary artery disease) 2001   CABG  . COPD (chronic obstructive pulmonary disease) (HCC)   . CVA (cerebral vascular accident) (HCC) 2001, 2013   x2  . Expressive aphasia   . 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 Hyperlipidemia   . Hypertension     Past Surgical History:  Procedure Laterality Date  . APPENDECTOMY    . CORONARY ARTERY BYPASS GRAFT  2001  . LUMBAR SPINE SURGERY       Current Outpatient Medications  Medication Sig Dispense Refill  . acetaminophen (TYLENOL) 500 MG tablet Take 500 mg by mouth every 4 (four) hours as needed for mild pain, moderate pain, fever or headache.     . albuterol (PROVENTIL) (2.5 MG/3ML) 0.083% nebulizer solution Take 3  mLs (2.5 mg total) by nebulization every 4 (four) hours as needed for wheezing or shortness of breath. 75 mL 0  . alum & mag hydroxide-simeth (MINTOX) 200-200-20 MG/5ML suspension Take 30 mLs by mouth as needed for indigestion or heartburn.    Marland Kitchen aspirin 325 MG EC tablet Take 325 mg by mouth daily with breakfast.     . atorvastatin (LIPITOR) 80 MG tablet Take 80 mg by mouth at bedtime.     . baclofen (LIORESAL) 20 MG tablet Take 20 mg by mouth 3 (three) times daily.    . clopidogrel (PLAVIX) 75 MG tablet Take 75 mg by mouth daily with breakfast.     . dicyclomine (BENTYL) 10 MG capsule Take 10 mg by mouth daily.    Marland Kitchen guaifenesin (ROBAFEN) 100 MG/5ML syrup Take 200 mg by mouth every 6 (six) hours as needed for cough.    . hydrALAZINE (APRESOLINE) 100 MG tablet Take 100 mg by mouth 2 (two) times daily.    Marland Kitchen labetalol (NORMODYNE) 200 MG tablet Take 200 mg by mouth 2 (two) times daily.    Marland Kitchen levETIRAcetam (KEPPRA) 750 MG tablet Take 750 mg by mouth 2 (two) times daily.    Marland Kitchen levothyroxine (SYNTHROID, LEVOTHROID) 25 MCG tablet Take 1 tablet by mouth  daily.    . loperamide (IMODIUM) 2 MG capsule Take by mouth as needed for diarrhea or loose stools.    . magnesium hydroxide (MILK OF MAGNESIA) 400 MG/5ML suspension Take 30 mLs by mouth at bedtime as needed for mild constipation.    . melatonin 3 MG TABS tablet Take 3 mg by mouth at bedtime.    Marland Kitchen neomycin-bacitracin-polymyxin (NEOSPORIN) ointment Apply 1 application topically as needed for wound care.    Bertram Gala Glycol-Propyl Glycol (SYSTANE ULTRA) 0.4-0.3 % SOLN Apply 1 drop to eye 3 (three) times daily.    . polyethylene glycol powder (GLYCOLAX/MIRALAX) powder Take 17 g by mouth 2 (two) times daily. Use for constipation, as prescribed, until daily soft stools  OTC (Patient taking differently: Take 17 g by mouth daily as needed for mild constipation or moderate constipation. ) 119 g 0  . predniSONE (DELTASONE) 10 MG tablet Take 30 mg daily for 3 days  then 20 mg daily for the next 3 days and then 10 mg daily till all the tablets are done. 20 tablet 0  . sertraline (ZOLOFT) 100 MG tablet Take 100 mg by mouth daily with breakfast.     . tamsulosin (FLOMAX) 0.4 MG CAPS capsule Take 1 capsule (0.4 mg total) by mouth daily. 30 capsule 0  . traZODone (DESYREL) 100 MG tablet Take 100 mg by mouth at bedtime.     No current facility-administered medications for this visit.    Allergies:   Clonidine and Sulfa antibiotics    ROS:  Please see the history of present illness.   Otherwise, review of systems are positive for none.   All other systems are reviewed and negative.    PHYSICAL EXAM: VS:  BP 132/74 (BP Location: Left Arm, Patient Position: Sitting, Cuff Size: Normal)   Pulse (!) 58   Ht 5\' 10"  (1.778 m)   Wt 180 lb (81.6 kg)   BMI 25.83 kg/m  , BMI Body mass index is 25.83 kg/m.  GEN:  No distress NECK:  No jugular venous distention at 90 degrees, waveform within normal limits, carotid upstroke brisk and symmetric, no bruits, no thyromegaly LYMPHATICS:  No cervical adenopathy LUNGS:  Clear to auscultation bilaterally BACK:  No CVA tenderness CHEST:  Unremarkable HEART:  S1 and S2 within normal limits, no S3, no S4, no clicks, no rubs, no murmurs ABD:  Positive bowel sounds normal in frequency in pitch, no bruits, no rebound, no guarding, unable to assess midline mass or bruit with the patient seated. EXT:  2 plus pulses throughout, trace edema, no cyanosis no clubbing SKIN:  No rashes no nodules NEURO:  Cranial nerves II through XII grossly intact, motor grossly intact throughout, right hemiparesis. PSYCH:  Cognitively intact, oriented to person place and time   EKG:  EKG is  ordered today. Sinus rhythm, rate 58, axis within normal limits, QTC is slightly prolonged, lateral T-wave inversions unchanged from previous.  02/04/2019   Recent Labs: 08/13/2019: B Natriuretic Peptide 266.7 08/15/2019: ALT 20; BUN 15; Creatinine, Ser  0.85; Hemoglobin 13.6; Platelets 221; Potassium 3.7; Sodium 139    Lipid Panel    Component Value Date/Time   CHOL 109 03/10/2017 1451   TRIG 135 03/10/2017 1451   HDL 35 (L) 03/10/2017 1451   CHOLHDL 3.1 03/10/2017 1451   LDLCALC 47 03/10/2017 1451      Wt Readings from Last 3 Encounters:  03/05/20 180 lb (81.6 kg)  08/13/19 186 lb 11.7 oz (84.7 kg)  06/21/19 191  lb 3.2 oz (86.7 kg)      Other studies Reviewed: Additional studies/ records that were reviewed today include: Labs Review of the above records demonstrates:  See elsewhere in the note.    ASSESSMENT AND PLAN:  CAD:    The patient has no new sypmtoms.  No further cardiovascular testing is indicated.  We will continue with aggressive risk reduction and meds as listed.  HTN:  The blood pressure is at target.  No change in therapy.   DYSLIPIDEMIA:   His LDL has not been checked since 2018.  I will draw this today.  It would be difficult for him to come back fasting so this is a nonfasting lab.  He had an excellent lipid profile previously.  They can check and see if they have recent labs from her primary provider.  If not to call we should get a fasting lipid profile.   CVA:     He had carotid stenting and has been discharged from follow-up with his neuroradiologist at [facility].   Current medicines are reviewed at length with the patient today.  The patient does not have concerns regarding medicines.  The following changes have been made:  None  Labs/ tests ordered today include:  None  Orders Placed This Encounter  Procedures  . Hepatic function panel  . Lipid panel  . EKG 12-Lead     Disposition:   FU with me in APP in 12 months.   Signed, Rollene Rotunda, MD  03/05/2020 2:45 PM    [facility]

## 2020-03-05 ENCOUNTER — Ambulatory Visit: Payer: Medicare HMO | Admitting: Cardiology

## 2020-03-05 ENCOUNTER — Encounter: Payer: Self-pay | Admitting: Cardiology

## 2020-03-05 ENCOUNTER — Other Ambulatory Visit: Payer: Self-pay

## 2020-03-05 VITALS — BP 132/74 | HR 58 | Ht 70.0 in | Wt 180.0 lb

## 2020-03-05 DIAGNOSIS — M7989 Other specified soft tissue disorders: Secondary | ICD-10-CM | POA: Diagnosis not present

## 2020-03-05 DIAGNOSIS — I2510 Atherosclerotic heart disease of native coronary artery without angina pectoris: Secondary | ICD-10-CM

## 2020-03-05 DIAGNOSIS — I10 Essential (primary) hypertension: Secondary | ICD-10-CM | POA: Diagnosis not present

## 2020-03-05 DIAGNOSIS — E785 Hyperlipidemia, unspecified: Secondary | ICD-10-CM

## 2020-03-05 NOTE — Patient Instructions (Signed)
Medication Instructions:  No changes *If you need a refill on your cardiac medications before your next appointment, please call your pharmacy*   Lab Work: Your provider would like for you to have the following labs today: Lipid and liver  If you have labs (blood work) drawn today and your tests are completely normal, you will receive your results only by: Marland Kitchen MyChart Message (if you have MyChart) OR . A paper copy in the mail If you have any lab test that is abnormal or we need to change your treatment, we will call you to review the results.   Testing/Procedures: None ordered   Follow-Up: At [facility], you and your health needs are our priority.  As part of our continuing mission to provide you with exceptional heart care, we have created designated Provider Care Teams.  These Care Teams include your primary Cardiologist (physician) and Advanced Practice Providers (APPs -  Physician Assistants and Nurse Practitioners) who all work together to provide you with the care you need, when you need it.  We recommend signing up for the patient portal called "MyChart".  Sign up information is provided on this After Visit Summary.  MyChart is used to connect with patients for Virtual Visits (Telemedicine).  Patients are able to view lab/test results, encounter notes, upcoming appointments, etc.  Non-urgent messages can be sent to your provider as well.   To learn more about what you can do with MyChart, go to [email or web address].    Your next appointment:   12 month(s)  The format for your next appointment:   In Person  Provider:   You will see one of the following Advanced Practice Providers on your designated Care Team:    Theodore Demark, PA-C  Joni Reining, DNP, ANP

## 2020-03-06 LAB — HEPATIC FUNCTION PANEL
ALT: 22 IU/L (ref 0–44)
AST: 17 IU/L (ref 0–40)
Albumin: 4.3 g/dL (ref 3.8–4.8)
Alkaline Phosphatase: 89 IU/L (ref 44–121)
Bilirubin Total: 0.3 mg/dL (ref 0.0–1.2)
Bilirubin, Direct: 0.11 mg/dL (ref 0.00–0.40)
Total Protein: 6.1 g/dL (ref 6.0–8.5)

## 2020-03-06 LAB — LIPID PANEL
Chol/HDL Ratio: 3.2 ratio (ref 0.0–5.0)
Cholesterol, Total: 111 mg/dL (ref 100–199)
HDL: 35 mg/dL — ABNORMAL LOW (ref >39)
LDL Chol Calc (NIH): 52 mg/dL (ref 0–99)
Triglycerides: 139 mg/dL (ref 0–149)
VLDL Cholesterol Cal: 24 mg/dL (ref 5–40)

## 2020-07-30 ENCOUNTER — Encounter: Payer: Self-pay | Admitting: *Deleted

## 2020-07-30 ENCOUNTER — Other Ambulatory Visit: Payer: Self-pay

## 2020-07-30 ENCOUNTER — Ambulatory Visit: Payer: Medicare HMO | Attending: Physician Assistant | Admitting: Occupational Therapy

## 2020-07-30 DIAGNOSIS — I69851 Hemiplegia and hemiparesis following other cerebrovascular disease affecting right dominant side: Secondary | ICD-10-CM | POA: Diagnosis present

## 2020-07-30 DIAGNOSIS — R208 Other disturbances of skin sensation: Secondary | ICD-10-CM | POA: Diagnosis present

## 2020-07-30 DIAGNOSIS — M25641 Stiffness of right hand, not elsewhere classified: Secondary | ICD-10-CM | POA: Insufficient documentation

## 2020-07-30 NOTE — Patient Instructions (Signed)
WEARING SCHEDULE:  Wear splint at night. Begin by wearing your splint during the day for an hour, then remove and check your hand for possible signs of pressure areas. Next, wear for 2 hours at a time until you can tolerate it for 4 hours. You may then begin to sleep in it at night at that time. Do not wear during the day after you have built up tolerance to sleeping in it. This splint is meant for night time use only.  PURPOSE:  Wear at night to help straighten your fingers and prevent contractures.  CARE OF SPLINT:  Keep splint away from heat sources including: stove, radiator or furnace, or a car in sunlight. The splint can melt and will no longer fit you properly  Keep away from pets and children  Clean the splint with rubbing alcohol as needed  * During this time, make sure you also clean your hand/arm as instructed by your therapist and/or perform dressing changes as needed. Then dry hand/arm completely before replacing splint. (When cleaning hand/arm, keep it immobilized in same position until splint is replaced)  PRECAUTIONS/POTENTIAL PROBLEMS: *If you notice or experience increased pain, swelling, numbness, or a lingering reddened area from the splint: Contact your therapist immediately by calling [phone]. You must wear the splint for protection, but we will get you scheduled for adjustments as quickly as possible.  (If only straps or hooks need to be replaced and NO adjustments to the splint need to be made, just call the office ahead and let them know you are coming in)  If you have any medical concerns or signs of infection, please call your doctor immediately

## 2020-07-30 NOTE — Therapy (Signed)
[facility] [address] [place], [place], [address] Phone: [phone]   Fax:  [phone]  Occupational Therapy Evaluation  Patient Details  Name: Daniel Oconnell MRN: [id number] Date of Birth: 05/16/1956 Referring Provider (OT): Renaee Munda, [place]   Encounter Date: 07/30/2020   OT End of Session - 07/30/20 1216    Visit Number 1    Number of Visits 4    Date for OT Re-Evaluation 08/30/20    Authorization Type [employer]    Authorization Time Period Every 10 visits    Authorization - Visit Number 1    Authorization - Number of Visits 4    Progress Note Due on Visit 3    OT Start Time 1008    OT Stop Time 1144    OT Time Calculation (min) 96 min    Activity Tolerance Patient tolerated treatment well    Behavior During Therapy [name] for tasks assessed/performed;Impulsive           Past Medical History:  Diagnosis Date  . 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 CAD (coronary artery disease) 2001   CABG  . COPD (chronic obstructive pulmonary disease) (HCC)   . CVA (cerebral vascular accident) (HCC) 2001, 2013   x2  . Expressive aphasia   . 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 Hyperlipidemia   . Hypertension     Past Surgical History:  Procedure Laterality Date  . APPENDECTOMY    . CORONARY ARTERY BYPASS GRAFT  2001  . LUMBAR SPINE SURGERY      There were no vitals filed for this visit.   Subjective Assessment - 07/30/20 1014    Subjective  Pt presents to out-pt OT for right noc resting splint following stroke in 2002 and 2013 with right hemiparesis.    Patient is accompanied by: Family member   Daniel Oconnell - Ex wife   Pertinent History CVA 2001 & 2013 w/ spastic hemiparesis R UE per pt/ex-wife report. apraxia, aphasia, sensory impairment R UE    Patient Stated Goals Get a new splint right UE    Currently in Pain? No/denies    Multiple Pain Sites No              [facility] OT Assessment - 07/30/20 0001      Assessment   Medical Diagnosis CVA    Referring Provider (OT) Cornelia Kurth, P-C    Onset Date/Surgical Date --   CVA 2002 and 2013   Hand Dominance Right   Eats and uses left hand as dominant hand since CVAs   Next MD Visit Sees 1x/week PRN at ALF    Prior Therapy Yes   Previous splints here     Precautions   Precautions Fall   Expressive aphasia, sensory impairment R UE   Precaution Comments Noc Splint    Required Braces or Orthoses Other Brace/Splint   Noc resting hand splint     Restrictions   Weight Bearing Restrictions No      Balance Screen   Has the patient fallen in the past 6 months No    Is the patient reluctant to leave their home because of a fear of falling?  No      Home  Environment   Family/patient expects to be discharged to: Assisted living    Lives With Other (Comment)   Lives a ALF     Prior Function   Level of Independence Independent with  basic ADLs    Vocation Retired    Leisure Sleeps, watches TV      ADL   ADL comments Pt reports Mod I ADL's, ALF prepares meals/assist with cutting food      Written Expression   Dominant Hand --   Right HD prior to CVA 19 years ago, now Left hand dom     Vision - History   Baseline Vision Wears glasses all the time      Vision Assessment   Comment No recent changes in vision.      Cognition   Overall Cognitive Status Within Functional Limits for tasks assessed   Pt appears impulsive, significant expressive aphasia     Sensation   Light Touch Impaired by gross assessment      Coordination   Gross Motor Movements are Fluid and Coordinated No    Fine Motor Movements are Fluid and Coordinated No                    OT Treatments/Exercises (OP) - 07/30/20 0001      Splinting   Splinting Therapist fabricated custom noc resting hand splint R UE. Pt and pt caregiver (ex-wife) were educated in splinting use, care and precautions. Pt demonstrated  don/doff of splint in clinic after fabrication noted. Pt will begin use of splint in 30-60 min intervals during the day, working up to 4 hours per day. If no pressure areas, then pt may begin sleeping in splint at noc. He will then transition to wearing at noc only and discontinue day time use. Minor adjustments  were made to his previous splint  so this may be used as a backup in the future. Both pt and caregiver verbalized understanding of treatment recommendations and splinting.                 OT Education - 07/30/20 1214    Education Details Splinting use, care and precautions R UE noc resting splint. Use old splint as back up PRN    Person(s) Educated Patient;Caregiver(s)   Pt Ex wife, whom brought pt today. Pt lives at ALF   Methods Explanation;Demonstration;Verbal cues;Handout    Comprehension Verbalized understanding;Returned demonstration            OT Short Term Goals - 07/30/20 1230      OT SHORT TERM GOAL #1   Title STG's=LTG's   Pt/caregiver will be Mod I w/ splint use, care and precautions R UE as observed in clinic setting    Time 6    Period Weeks    Status New    Target Date 09/10/20      OT SHORT TERM GOAL #2   Title Pt/caregiver will be Mod I updated HEP for don/doffing noc resting hand splint as observed in clinic setting.    Time 6    Period Weeks    Status New    Target Date 09/10/20                    Plan - 07/30/20 1218    Clinical Impression Statement Pt is a pleasant 65 y/o male s/p CVA in 2001 and 2013 with residual right hemiparesis with spasticity. He lives in an ALF and was referred today by Renaee Munda, PA-C. He presents to clinic today for fabrication of a new Noc Resting hand splint as his last splint, fabricated in Dec 2020 "has broken" per pt caregiver report. He presents with the following deificts: R hemiparesis, spasticity,  aphasia, sensory deficits R UE and broken splint which impeade performance and proper positioning of R  UE at Noc to prevent contractures due to spasticity. He can benefit from out-pt OT to address splinting needs and pt education for prevention of  contractures and improved positioning as his last splint had broken. A night resting hand splint was fabricated for his R UE and he was able to don/doff in clinic after instruction today. He will followup for up to an additional 3-4 visits for splinting adjustments over the next 6 weeks.     OT Occupational Profile and History Problem Focused Assessment - Including review of records relating to presenting problem    Occupational performance deficits (Please refer to evaluation for details): ADL's    Body Structure / Function / Physical Skills ADL;UE functional use;Flexibility;GMC;Sensation;FMC;Dexterity    Rehab Potential Good    Clinical Decision Making Several treatment options, min-mod task modification necessary    Comorbidities Affecting Occupational Performance: May have comorbidities impacting occupational performance    Modification or Assistance to Complete Evaluation  Min-Moderate modification of tasks or assist with assess necessary to complete eval    OT Frequency Other (comment)   Eval +3-4 additional visits 1x/week over next 6 weeks   OT Duration 6 weeks    OT Treatment/Interventions Self-care/ADL training;Splinting;Therapeutic activities;Patient/family education;Manual Therapy;Fluidtherapy    Plan Splint check and adjustments PRN R night resting hand splint. Pt/family education.    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 Community acquired bacterial pneumonia 08/13/2019  . History of seizure 08/13/2019  . OSA (obstructive sleep apnea) 08/13/2019  . Cerebrovascular accident (CVA) (HCC) 02/24/2019  . Dyslipidemia 02/23/2019  . Leg swelling 03/10/2017  . Medication management 03/10/2017  . Hyperlipidemia 03/10/2017  . Adrenal abnormality (HCC)   . Expressive aphasia   . Acute respiratory failure with hypoxia (HCC) 04/17/2016  . Abdominal pain, acute, right lower quadrant 04/17/2016  . Nonspecific abnormal electrocardiogram (ECG) (EKG) 04/17/2016  . H/O: CVA (cerebrovascular accident) 04/17/2016  . COPD with acute exacerbation (HCC) 04/17/2016  . CAD (coronary artery disease) 04/17/2016  . Essential hypertension 04/17/2016  . Right lower quadrant abdominal pain   . HCAP (healthcare-associated pneumonia) 04/16/2016   WEARING SCHEDULE:  Wear splint at night. Begin by wearing your splint during the day for an hour, then remove and check your hand for possible signs of pressure areas. Next, wear for 2 hours at a time until you can tolerate it for 4 hours. You may then begin to sleep in it at night at that time. Do not wear during the day after you have built up tolerance to sleeping in it. This splint is meant for night time use only.  PURPOSE:  Wear at night to help straighten your fingers and prevent contractures.  CARE OF SPLINT:  Keep splint away from heat sources including: stove, radiator or furnace, or a car in sunlight. The splint can melt and will no longer fit you properly  Keep away from pets and children  Clean the splint with rubbing alcohol as needed  * During this time, make sure you also clean  your hand/arm as instructed by your therapist and/or perform dressing changes as needed. Then dry hand/arm completely before replacing splint. (When cleaning hand/arm, keep it immobilized in same position until splint is replaced)  PRECAUTIONS/POTENTIAL PROBLEMS: *If you notice or experience increased pain, swelling, numbness, or a lingering reddened area from the splint: Contact your therapist immediately by calling [phone]. You must wear the splint for protection, but we will get you scheduled for adjustments as quickly as possible.  (If only straps or hooks need to be replaced and NO adjustments to the splint need to be made, just call the office ahead and let them know you are coming in)  If you have any medical concerns or signs of infection, please call your doctor immediately  Alm Bustard, OTR/L 07/30/2020, 12:41 PM  [facility] [address] [place], [place], [address] Phone: [phone]   Fax:  [phone]  Name: Daniel Oconnell MRN: [id number] Date of Birth: 06/04/1955

## 2020-08-05 ENCOUNTER — Encounter: Payer: Medicare HMO | Admitting: Occupational Therapy

## 2020-08-07 ENCOUNTER — Ambulatory Visit: Payer: Medicare HMO | Admitting: Occupational Therapy

## 2020-08-07 ENCOUNTER — Other Ambulatory Visit: Payer: Self-pay

## 2020-08-07 DIAGNOSIS — M25641 Stiffness of right hand, not elsewhere classified: Secondary | ICD-10-CM

## 2020-08-07 DIAGNOSIS — I69851 Hemiplegia and hemiparesis following other cerebrovascular disease affecting right dominant side: Secondary | ICD-10-CM

## 2020-08-07 NOTE — Therapy (Signed)
[facility] [address] [place], [place], [address] Phone: [phone]   Fax:  [phone]  Occupational Therapy Treatment  Patient Details  Name: Daniel Oconnell MRN: [id number] Date of Birth: 1955/06/06 Referring Provider (OT): Renaee Munda, [place]   Encounter Date: 08/07/2020   OT End of Session - 08/07/20 1212    Visit Number 2    Number of Visits 4    Date for OT Re-Evaluation 08/30/20    Authorization Type [employer]    Authorization Time Period Every 10 visits    Authorization - Visit Number 2    Authorization - Number of Visits 4    OT Start Time 1015    OT Stop Time 1100    OT Time Calculation (min) 45 min    Activity Tolerance Patient tolerated treatment well    Behavior During Therapy [facility] for tasks assessed/performed;Impulsive           Past Medical History:  Diagnosis Date  . 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 CAD (coronary artery disease) 2001   CABG  . COPD (chronic obstructive pulmonary disease) (HCC)   . CVA (cerebral vascular accident) (HCC) 2001, 2013   x2  . Expressive aphasia   . 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 Hyperlipidemia   . Hypertension     Past Surgical History:  Procedure Laterality Date  . APPENDECTOMY    . CORONARY ARTERY BYPASS GRAFT  2001  . LUMBAR SPINE SURGERY      There were no vitals filed for this visit.   Subjective Assessment - 08/07/20 1210    Patient is accompanied by: Family member   Darl Pikes (ex-wife)   Pertinent History CVA 2001 & 2013 w/ spastic hemiparesis R UE per pt/ex-wife report. apraxia, aphasia, sensory impairment R UE    Currently in Pain? Yes   unable to verbalize d/t aphasia but pointing to area of splint causing pain           Pt arrived with complaints of current problem areas with splint (pt points to spots of concern d/t aphasia and ex wife also able to report problems w/ splint)  particularly radial side of splint at base of thumb and wrist and position of thumb. Upon further evaluation, both these areas appear to be more related to wrist not sitting well in splint and popping up. Therefore adjusted strapping and hook placement for wrist strap to hold wrist in better position which improved both areas. Therapist also padded radial side of splint for increased comfort.  Pt wanted a whole new splint fabricated, but after adjusting splint and explaining rationale for why it was bothering him, he agreed to gradually build up tolerance over this next week to get used to current splint (fabricated last week) - anticipate this will resolve the issues. This therapist also explained that even if we make a new one, it cannot possibly be exactly like the older splint and it would have the same issues due to pt's spasticity.                       OT Short Term Goals - 08/07/20 1213      OT SHORT TERM GOAL #1   Title STG's=LTG's   Pt/caregiver will be Mod I w/ splint use, care and precautions R UE as observed in clinic setting    Time 6    Period Weeks  Status On-going    Target Date 09/10/20      OT SHORT TERM GOAL #2   Title Pt/caregiver will be Mod I updated HEP for don/doffing noc resting hand splint as observed in clinic setting.    Time 6    Period Weeks    Status Deferred   not needed, but can don/doff I'ly   Target Date 09/10/20                    Plan - 08/07/20 1214    Clinical Impression Statement Pt progressing towards splinting goal. Adjustments made today to most likely reduce/eliminate discomfort    Occupational performance deficits (Please refer to evaluation for details): ADL's    Body Structure / Function / Physical Skills ADL;UE functional use;Flexibility;GMC;Sensation;FMC;Dexterity    OT Treatment/Interventions Self-care/ADL training;Splinting;Therapeutic activities;Patient/family education;Manual Therapy;Fluidtherapy    Plan  further adjustments prn to current splint (if it still really bothers him, will consider making new one but anticipate adjustments made today will help with complaints). 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 Community acquired bacterial pneumonia 08/13/2019  . History of seizure 08/13/2019  . OSA (obstructive sleep apnea) 08/13/2019  . Cerebrovascular accident (CVA) (HCC) 02/24/2019  . Dyslipidemia 02/23/2019  . Leg swelling 03/10/2017  . Medication management 03/10/2017  . Hyperlipidemia 03/10/2017  . Adrenal abnormality (HCC)   . Expressive aphasia   . Acute respiratory failure with hypoxia (HCC) 04/17/2016  . Abdominal pain, acute, right lower quadrant 04/17/2016  . Nonspecific abnormal electrocardiogram (ECG) (EKG) 04/17/2016  . H/O: CVA (cerebrovascular accident) 04/17/2016  . COPD with acute exacerbation (HCC) 04/17/2016  . CAD (coronary artery disease) 04/17/2016  . Essential hypertension 04/17/2016  . Right lower quadrant abdominal pain   . HCAP (healthcare-associated pneumonia) 04/16/2016    Kelli Churn, OTR/L 08/07/2020, 12:17 PM  [facility] [address] [place], [place], [address] Phone: [phone]   Fax:  [phone]  Name: Ramsey Guadamuz MRN: [id number] Date of Birth: 1955/07/07

## 2020-08-13 ENCOUNTER — Other Ambulatory Visit: Payer: Self-pay

## 2020-08-13 ENCOUNTER — Ambulatory Visit: Payer: Medicare HMO | Admitting: Occupational Therapy

## 2020-08-13 DIAGNOSIS — M25641 Stiffness of right hand, not elsewhere classified: Secondary | ICD-10-CM

## 2020-08-13 DIAGNOSIS — I69851 Hemiplegia and hemiparesis following other cerebrovascular disease affecting right dominant side: Secondary | ICD-10-CM

## 2020-08-13 NOTE — Therapy (Signed)
[facility] [address] [place], [place], [address] Phone: [phone]   Fax:  [phone]  Occupational Therapy Treatment  Patient Details  Name: Daniel Oconnell MRN: [id number] Date of Birth: 1955-09-07 Referring Provider (OT): Raelyn Number, [place]   Encounter Date: 08/13/2020   OT End of Session - 08/13/20 1345    Visit Number 3    Number of Visits 4    Date for OT Re-Evaluation 08/30/20    Authorization Type [employer]    Authorization Time Period Every 10 visits    Authorization - Visit Number 3    Authorization - Number of Visits 4    OT Start Time 9470    OT Stop Time 1345    OT Time Calculation (min) 30 min    Activity Tolerance Patient tolerated treatment well    Behavior During Therapy [facility] for tasks assessed/performed;Impulsive           Past Medical History:  Diagnosis Date  . 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 CAD (coronary artery disease) 2001   CABG  . COPD (chronic obstructive pulmonary disease) ([place])   . CVA (cerebral vascular accident) ([place]) 2001, 2013   x2  . Expressive aphasia   . 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 Hyperlipidemia   . Hypertension     Past Surgical History:  Procedure Laterality Date  . APPENDECTOMY    . CORONARY ARTERY BYPASS GRAFT  2001  . LUMBAR SPINE SURGERY      There were no vitals filed for this visit.   Subjective Assessment - 08/13/20 1344    Subjective  Per yes/no - pt reports splint is doing better and able to now sleep in it    Patient is accompanied by: Family member   Daniel Oconnell (ex-wife)   Pertinent History CVA 2001 & 2013 w/ spastic hemiparesis R UE per pt/ex-wife report. apraxia, aphasia, sensory impairment R UE    Currently in Pain? No/denies           Reviewed proper donning of new resting hand splint and making sure pt is in splint fully and wrist is down w/ adapted strap. Made several minor  adjustments to older splint including strapping/hooks as he uses this splint as a back up. Pt has no further issues and will return only if needed.                        OT Short Term Goals - 08/13/20 1346      OT SHORT TERM GOAL #1   Title STG's=LTG's   Pt/caregiver will be Mod I w/ splint use, care and precautions R UE as observed in clinic setting    Time 6    Period Weeks    Status Achieved    Target Date 09/10/20      OT SHORT TERM GOAL #2   Title Pt/caregiver will be Mod I updated HEP for don/doffing noc resting hand splint as observed in clinic setting.    Time 6    Period Weeks    Status Deferred   not needed, but can don/doff I'ly   Target Date 09/10/20                    Plan - 08/13/20 1346    Clinical Impression Statement Pt has met splinting goal at this time.    Occupational performance deficits (Please refer to evaluation  for details): ADL's    Body Structure / Function / Physical Skills ADL;UE functional use;Flexibility;GMC;Sensation;FMC;Dexterity    OT Treatment/Interventions Self-care/ADL training;Splinting;Therapeutic activities;Patient/family education;Manual Therapy;Fluidtherapy    Plan Will leave last scheduled appointment for next week only if needed - pt/wife to call and cancel if not needed and will d/c episode of care at that time    Consulted and Agree with Plan of Care Patient;Family member/caregiver    Family Member Consulted Ex-wife           Patient will benefit from skilled therapeutic intervention in order to improve the following deficits and impairments:   Body Structure / Function / Physical Skills: ADL,UE functional use,Flexibility,GMC,Sensation,FMC,Dexterity       Visit Diagnosis: 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  Community acquired bacterial pneumonia 08/13/2019  . History of seizure 08/13/2019  . OSA (obstructive sleep apnea) 08/13/2019  . Cerebrovascular accident (CVA) ([place]) 02/24/2019  . Dyslipidemia 02/23/2019  . Leg swelling 03/10/2017  . Medication management 03/10/2017  . Hyperlipidemia 03/10/2017  . Adrenal abnormality ([place])   . Expressive aphasia   . Acute respiratory failure with hypoxia ([place]) 04/17/2016  . Abdominal pain, acute, right lower quadrant 04/17/2016  . Nonspecific abnormal electrocardiogram (ECG) (EKG) 04/17/2016  . H/O: CVA (cerebrovascular accident) 04/17/2016  . COPD with acute exacerbation ([place]) 04/17/2016  . CAD (coronary artery disease) 04/17/2016  . Essential hypertension 04/17/2016  . Right lower quadrant abdominal pain   . HCAP (healthcare-associated pneumonia) 04/16/2016    Carey Bullocks, OTR/L 08/13/2020, 1:47 PM  [place] [address] [place], [place], [address] Phone: [phone]   Fax:  [phone]  Name: Daniel Oconnell MRN: [id number] Date of Birth: Sep 10, 1955

## 2020-08-19 ENCOUNTER — Encounter: Payer: Medicare HMO | Admitting: Occupational Therapy

## 2020-08-19 ENCOUNTER — Ambulatory Visit: Payer: Medicare HMO | Admitting: Occupational Therapy

## 2020-08-20 ENCOUNTER — Ambulatory Visit: Payer: Medicare HMO | Admitting: Occupational Therapy

## 2020-09-09 ENCOUNTER — Other Ambulatory Visit: Payer: Self-pay

## 2020-09-09 ENCOUNTER — Ambulatory Visit: Payer: Medicare HMO | Attending: Physician Assistant | Admitting: Occupational Therapy

## 2020-09-09 DIAGNOSIS — M25641 Stiffness of right hand, not elsewhere classified: Secondary | ICD-10-CM | POA: Diagnosis present

## 2020-09-09 DIAGNOSIS — I69851 Hemiplegia and hemiparesis following other cerebrovascular disease affecting right dominant side: Secondary | ICD-10-CM | POA: Insufficient documentation

## 2020-09-09 NOTE — Therapy (Signed)
[facility] [address] [place], [place], [address] Phone: [phone]   Fax:  [phone]  Occupational Therapy Treatment  Patient Details  Name: Daniel Oconnell MRN: [id number] Date of Birth: 1955/10/02 Referring Provider (OT): Renaee Munda, [place]   Encounter Date: 09/09/2020   OT End of Session - 09/09/20 1129    Visit Number 4    Number of Visits 4    Date for OT Re-Evaluation 08/30/20    Authorization Type [employer]    Authorization Time Period Every 10 visits    Authorization - Visit Number 4    Authorization - Number of Visits 4    OT Start Time 1015    OT Stop Time 1115    OT Time Calculation (min) 60 min    Activity Tolerance Patient tolerated treatment well    Behavior During Therapy [facility] for tasks assessed/performed;Impulsive           Past Medical History:  Diagnosis Date  . 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 CAD (coronary artery disease) 2001   CABG  . COPD (chronic obstructive pulmonary disease) (HCC)   . CVA (cerebral vascular accident) (HCC) 2001, 2013   x2  . Expressive aphasia   . 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 Hyperlipidemia   . Hypertension     Past Surgical History:  Procedure Laterality Date  . APPENDECTOMY    . CORONARY ARTERY BYPASS GRAFT  2001  . LUMBAR SPINE SURGERY      There were no vitals filed for this visit.   Subjective Assessment - 09/09/20 1128    Subjective  wife reports he isn't wearing new splint and cannot tolerate it    Patient is accompanied by: Family member   Darl Pikes (ex-wife)   Pertinent History CVA 2001 & 2013 w/ spastic hemiparesis R UE per pt/ex-wife report. apraxia, aphasia, sensory impairment R UE    Currently in Pain? No/denies           Despite adjustments and adjusting wrist strap, pt is not wearing newer splint and caregiver (ex-wife) reports he is unable to tolerate it. Therefore made  another new resting hand splint today for increased comfort, better fit, and greater ease donning/doffing. Pt very pleased with this splint and do not anticipate any problems with splint. Pt/caregiver to call only if problems arise.                      OT Education - 09/09/20 1115    Education Details splint wear and care review    Person(s) Educated Patient;Caregiver(s)    Methods Explanation;Demonstration;Verbal cues;Handout    Comprehension Verbalized understanding            OT Short Term Goals - 08/13/20 1346      OT SHORT TERM GOAL #1   Title STG's=LTG's   Pt/caregiver will be Mod I w/ splint use, care and precautions R UE as observed in clinic setting    Time 6    Period Weeks    Status Achieved    Target Date 09/10/20      OT SHORT TERM GOAL #2   Title Pt/caregiver will be Mod I updated HEP for don/doffing noc resting hand splint as observed in clinic setting.    Time 6    Period Weeks    Status Deferred   not needed, but can don/doff I'ly   Target Date 09/10/20  Plan - 09/09/20 1130    Clinical Impression Statement Pt unable to tolerate newer splint therefore made new splint today for better fit and ease donning/doffing    Occupational performance deficits (Please refer to evaluation for details): ADL's    Body Structure / Function / Physical Skills ADL;UE functional use;Flexibility;GMC;Sensation;FMC;Dexterity    OT Treatment/Interventions Self-care/ADL training;Splinting;Therapeutic activities;Patient/family education;Manual Therapy;Fluidtherapy    Plan will call only if needed for further splint adjustments since new splint made today - however pt was very pleased with this new splint and could also don/doff better    Consulted and Agree with Plan of Care Patient;Family member/caregiver    Family Member Consulted Ex-wife           Patient will benefit from skilled therapeutic intervention in order to improve the  following deficits and impairments:   Body Structure / Function / Physical Skills: ADL,UE functional use,Flexibility,GMC,Sensation,FMC,Dexterity       Visit Diagnosis: 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 Community acquired bacterial pneumonia 08/13/2019  . History of seizure 08/13/2019  . OSA (obstructive sleep apnea) 08/13/2019  . Cerebrovascular accident (CVA) (HCC) 02/24/2019  . Dyslipidemia 02/23/2019  . Leg swelling 03/10/2017  . Medication management 03/10/2017  . Hyperlipidemia 03/10/2017  . Adrenal abnormality (HCC)   . Expressive aphasia   . Acute respiratory failure with hypoxia (HCC) 04/17/2016  . Abdominal pain, acute, right lower quadrant 04/17/2016  . Nonspecific abnormal electrocardiogram (ECG) (EKG) 04/17/2016  . H/O: CVA (cerebrovascular accident) 04/17/2016  . COPD with acute exacerbation (HCC) 04/17/2016  . CAD (coronary artery disease) 04/17/2016  . Essential hypertension 04/17/2016  . Right lower quadrant abdominal pain   . HCAP (healthcare-associated pneumonia) 04/16/2016    Kelli Churn, OTR/L 09/09/2020, 11:32 AM  [facility] [address] [place], [place], [address] Phone: [phone]   Fax:  [phone]  Name: Strider Vallance MRN: [id number] Date of Birth: [birth year]

## 2020-09-09 NOTE — Patient Instructions (Signed)
  WEARING SCHEDULE:  Wear splint mostly at night. Build up tolerance during the day initially adding an hour each time until you can tolerate 4 consecutive hours. Redness is normal and should fade within 20 minutes like a sock line.   PURPOSE:  For better positioning and to prevent contractures  CARE OF SPLINT:  Keep splint away from heat sources including: stove, radiator or furnace, or a car in sunlight. The splint can melt and will no longer fit you properly  Keep away from pets and children  Clean the splint with rubbing alcohol 1-2 times per day.  * During this time, make sure you also clean your hand/arm. Then dry hand/arm completely before replacing splint.  PRECAUTIONS/POTENTIAL PROBLEMS: *If you notice or experience increased pain, swelling, numbness, or a lingering reddened area (greater than 20 minutes) from the splint: Contact your therapist immediately by calling [phone]. You must wear the splint for protection, but we will get you scheduled for adjustments as quickly as possible.  (If only straps or hooks need to be replaced and NO adjustments to the splint need to be made, just call the office ahead and let them give you the best time to come in)   Michel Santee (will need 2 appointment slots for a new splint)

## 2020-09-11 ENCOUNTER — Ambulatory Visit: Payer: Medicare HMO | Admitting: Occupational Therapy

## 2021-07-18 ENCOUNTER — Other Ambulatory Visit: Payer: Self-pay

## 2021-07-18 ENCOUNTER — Encounter: Payer: Self-pay | Admitting: Pulmonary Disease

## 2021-07-18 ENCOUNTER — Ambulatory Visit: Payer: Medicare HMO | Admitting: Pulmonary Disease

## 2021-07-18 VITALS — BP 138/84 | HR 63 | Ht 70.0 in | Wt 180.0 lb

## 2021-07-18 DIAGNOSIS — J439 Emphysema, unspecified: Secondary | ICD-10-CM

## 2021-07-18 DIAGNOSIS — J9611 Chronic respiratory failure with hypoxia: Secondary | ICD-10-CM | POA: Diagnosis not present

## 2021-07-18 DIAGNOSIS — Z87891 Personal history of nicotine dependence: Secondary | ICD-10-CM

## 2021-07-18 MED ORDER — ALBUTEROL SULFATE HFA 108 (90 BASE) MCG/ACT IN AERS: 1.0000 | INHALATION_SPRAY | Freq: Four times a day (QID) | RESPIRATORY_TRACT | 6 refills | Status: DC | PRN

## 2021-07-18 MED ORDER — BEVESPI AEROSPHERE 9-4.8 MCG/ACT IN AERO: 2.0000 | INHALATION_SPRAY | Freq: Two times a day (BID) | RESPIRATORY_TRACT | 6 refills | Status: DC

## 2021-07-18 NOTE — Patient Instructions (Addendum)
Start Bevespi inhaler 2 puffs twice daily ? ?Use albuterol inhaler 1-2 puffs every 4-6 hours as needed ? ?We will refer you to our lung cancer screening program ? ?Continue to use supplemental oxygen with goal of around 92%. Can titrate O2 level accordingly.  ? ?Follow up in 6 months  ? ? ?

## 2021-07-18 NOTE — Progress Notes (Signed)
Synopsis: Referred in March 2023 for COPD by Uvaldo Bristle, PA  Subjective:   PATIENT ID: Daniel Oconnell GENDER: male DOB: 13-Aug-1955, MRN: [id number]   HPI  Chief Complaint  Patient presents with   Consult    Referred by PCP for COPD. Currently using 2L of O2. States his SOB has increased over the last 3-4 weeks. Uses [employer] as his DME.    Yordan Warzecha is a 66 year old male, former smoker with hypertension, coronary artery disease s/p CABG and CVA who is referred to pulmonary clinic for COPD.   He is accompanied by his ex-wife, Darl Pikes. He has right sided deficit and does have degree of aphasia given his history of CVA. He reports being diagnosed with COPD many years ago and has been on supplemental oxygen since a hospitalization for pneumonia in 2017. He is currently using albuterol 2 puffs three times daily. He reports increasing dyspnea with his PT/OT sessions at [facility]. He lives at a nursing home. He is using 2.5L at rest and up to 4L with physical activity. He denies any cough or wheezing. He quit smoking in 2013 after his stroke. 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   He has over a 20 pack year smoking history. He is disabled due to the stroke.   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    Hyperlipidemia    Hypertension      Family History  Problem Relation Age of Onset   CVA Mother 19   Aneurysm Father      Social History   Socioeconomic History   Marital status: Divorced    Spouse name: Not on file   Number of children: 2   Years of education: Not on file   Highest education  level: Not on file  Occupational History   Occupation: Disabled    Comment: Licensed conveyancer  Tobacco Use   Smoking status: Former    Types: Cigarettes    Quit date: 2013    Years since quitting: 10.1   Smokeless tobacco: Never  Substance and Sexual Activity   Alcohol use: No    Comment: h/o heavy ETOH use   Drug use: No    Types: Cocaine, Marijuana    Comment: h/o drug use   Sexual activity: Not on file  Other Topics Concern   Not on file  Social History Narrative   Not on file   Social Determinants of Health   Financial Resource Strain: Not on file  Food Insecurity: Not on file  Transportation Needs: Not on file  Physical Activity: Not on file  Stress: Not on file  Social Connections: Not on file  Intimate Partner Violence: Not on file     Allergies  Allergen Reactions   Clonidine Diarrhea    Not listed on MAR   Sulfa Antibiotics Other (See Comments)    Unknown; not listed on MAR     Outpatient Medications Prior to Visit  Medication Sig Dispense Refill   acetaminophen (TYLENOL) 500 MG tablet Take 500 mg by mouth every 4 (four) hours as needed for mild  pain, moderate pain, fever or headache.      albuterol (PROVENTIL) (2.5 MG/3ML) 0.083% nebulizer solution Take 3 mLs (2.5 mg total) by nebulization every 4 (four) hours as needed for wheezing or shortness of breath. 75 mL 0   alum & mag hydroxide-simeth (MAALOX/MYLANTA) 200-200-20 MG/5ML suspension Take 30 mLs by mouth as needed for indigestion or heartburn.     aspirin 325 MG EC tablet Take 325 mg by mouth daily with breakfast.      atorvastatin (LIPITOR) 80 MG tablet Take 80 mg by mouth at bedtime.      baclofen (LIORESAL) 20 MG tablet Take 20 mg by mouth 3 (three) times daily.     clopidogrel (PLAVIX) 75 MG tablet Take 75 mg by mouth daily with breakfast.      dicyclomine (BENTYL) 10 MG capsule Take 10 mg by mouth daily.     guaifenesin (ROBITUSSIN) 100 MG/5ML syrup Take 200 mg by mouth every 6 (six) hours  as needed for cough.     hydrALAZINE (APRESOLINE) 100 MG tablet Take 100 mg by mouth 2 (two) times daily.     labetalol (NORMODYNE) 200 MG tablet Take 200 mg by mouth 2 (two) times daily.     levETIRAcetam (KEPPRA) 750 MG tablet Take 750 mg by mouth 2 (two) times daily.     levothyroxine (SYNTHROID, LEVOTHROID) 25 MCG tablet Take 1 tablet by mouth daily.     loperamide (IMODIUM) 2 MG capsule Take by mouth as needed for diarrhea or loose stools.     magnesium hydroxide (MILK OF MAGNESIA) 400 MG/5ML suspension Take 30 mLs by mouth at bedtime as needed for mild constipation.     melatonin 3 MG TABS tablet Take 3 mg by mouth at bedtime.     neomycin-bacitracin-polymyxin (NEOSPORIN) ointment Apply 1 application topically as needed for wound care.     Polyethyl Glycol-Propyl Glycol (SYSTANE ULTRA) 0.4-0.3 % SOLN Apply 1 drop to eye 3 (three) times daily.     polyethylene glycol powder (GLYCOLAX/MIRALAX) powder Take 17 g by mouth 2 (two) times daily. Use for constipation, as prescribed, until daily soft stools  OTC (Patient taking differently: Take 17 g by mouth daily as needed for mild constipation or moderate constipation.) 119 g 0   sertraline (ZOLOFT) 100 MG tablet Take 100 mg by mouth daily with breakfast.      tamsulosin (FLOMAX) 0.4 MG CAPS capsule Take 1 capsule (0.4 mg total) by mouth daily. 30 capsule 0   traZODone (DESYREL) 100 MG tablet Take 100 mg by mouth at bedtime.     predniSONE (DELTASONE) 10 MG tablet Take 30 mg daily for 3 days then 20 mg daily for the next 3 days and then 10 mg daily till all the tablets are done. 20 tablet 0   No facility-administered medications prior to visit.   Review of Systems  Constitutional:  Negative for chills, fever, malaise/fatigue and weight loss.  HENT:  Negative for congestion, sinus pain and sore throat.   Eyes: Negative.   Respiratory:  Positive for shortness of breath. Negative for cough, hemoptysis, sputum production and wheezing.    Cardiovascular:  Negative for chest pain, palpitations, orthopnea, claudication and leg swelling.  Gastrointestinal:  Negative for abdominal pain, heartburn, nausea and vomiting.  Genitourinary: Negative.   Musculoskeletal:  Negative for joint pain and myalgias.  Skin:  Negative for rash.  Neurological:  Negative for weakness.  Endo/Heme/Allergies: Negative.   Psychiatric/Behavioral: Negative.     Objective:   Vitals:  07/18/21 1526  BP: 138/84  Pulse: 63  SpO2: 95%  Weight: 180 lb (81.6 kg)  Height: 5\' 10"  (1.778 m)   Physical Exam Constitutional:      General: He is not in acute distress.    Comments: Nasal canula in place, POC  HENT:     Head: Normocephalic and atraumatic.  Eyes:     Extraocular Movements: Extraocular movements intact.     Conjunctiva/sclera: Conjunctivae normal.     Pupils: Pupils are equal, round, and reactive to light.  Cardiovascular:     Rate and Rhythm: Normal rate and regular rhythm.     Pulses: Normal pulses.     Heart sounds: Normal heart sounds. No murmur heard. Pulmonary:     Effort: Pulmonary effort is normal.     Breath sounds: Decreased breath sounds present. No wheezing, rhonchi or rales.  Abdominal:     General: Bowel sounds are normal.     Palpations: Abdomen is soft.  Musculoskeletal:     Right lower leg: No edema.     Left lower leg: No edema.  Lymphadenopathy:     Cervical: No cervical adenopathy.  Skin:    General: Skin is warm and dry.  Neurological:     General: No focal deficit present.     Mental Status: He is alert.  Psychiatric:        Mood and Affect: Mood normal.        Behavior: Behavior normal.        Thought Content: Thought content normal.        Judgment: Judgment normal.    CBC    Component Value Date/Time   WBC 17.2 (H) 08/15/2019 0545   RBC 4.76 08/15/2019 0545   HGB 13.6 08/15/2019 0545   HCT 41.6 08/15/2019 0545   PLT 221 08/15/2019 0545   MCV 87.4 08/15/2019 0545   MCH 28.6 08/15/2019 0545    MCHC 32.7 08/15/2019 0545   RDW 13.6 08/15/2019 0545   LYMPHSABS 0.8 08/13/2019 1447   MONOABS 0.6 08/13/2019 1447   EOSABS 0.1 08/13/2019 1447   BASOSABS 0.1 08/13/2019 1447   BMP Latest Ref Rng & Units 08/15/2019 08/14/2019 08/13/2019  Glucose 70 - 99 mg/dL 08/15/2019) 371(I) 967(E)  BUN 8 - 23 mg/dL 15 15 15   Creatinine 0.61 - 1.24 mg/dL 938(B 0.17  BUN/Creat Ratio 10 - 24 - - -  Sodium 135 - 145 mmol/L 139 137 143  Potassium 3.5 - 5.1 mmol/L 3.7 3.6 3.6  Chloride 98 - 111 mmol/L 104 104 106  CO2 22 - 32 mmol/L 27 24 26   Calcium 8.9 - 10.3 mg/dL 8.3(L) 8.6(L) 8.6(L)   Chest imaging: CXR 08/13/19 Single frontal view of the chest demonstrates postsurgical changes from median sternotomy. The cardiac silhouette is stable. There is chronic central vascular congestion and diffuse interstitial prominence. No airspace disease, effusion, or pneumothorax.  CTA Chest 04/16/16 Mediastinum/Nodes: No mediastinal lymphadenopathy. There is no hilar lymphadenopathy. Borderline lymphadenopathy is identified in the right hilum. The esophagus has normal imaging features. There is no axillary lymphadenopathy.   Lungs/Pleura: Emphysema noted bilaterally with interlobular septal thickening and bronchial wall thickening. Dependent collapse/consolidation is identified in both lower lobes and small bilateral pleural effusions are evident.  PFT: No flowsheet data found.  Labs:  Path:  Echo:  Heart Catheterization:  Assessment & Plan:   Pulmonary emphysema, unspecified emphysema type (HCC) - Plan: Glycopyrrolate-Formoterol (BEVESPI AEROSPHERE) 9-4.8 MCG/ACT AERO, albuterol (VENTOLIN HFA) 108 (90 Base) MCG/ACT inhaler  Chronic hypoxemic respiratory failure (HCC)  Former smoker - Plan: Ambulatory Referral for Lung Cancer Scre  Discussion: Daniel JacobKirk Castilleja is a 66 year old male, former smoker with hypertension, coronary artery disease s/p CABG and CVA who is referred to pulmonary clinic for COPD.    He has history of emphysema as noted by his previous CT chest scan in 2017.   We will start him on bevespi aerosphere, 2 puffs twice daily. He can continue to use albuterol inhaler as needed 1-2 puffs every 4-6 hours.   We will refer him to our lung cancer screening program as he has over a 20 pack year smoking history and quit within the past 15 years.   He is to continue supplemental oxygen with a goal O2 saturation around 92%.   Follow up in 6 months.   Melody ComasJonathan Britzy Graul, MD [facility] Office: [phone]    Current Outpatient Medications:    acetaminophen (TYLENOL) 500 MG tablet, Take 500 mg by mouth every 4 (four) hours as needed for mild pain, moderate pain, fever or headache. , Disp: , Rfl:    albuterol (PROVENTIL) (2.5 MG/3ML) 0.083% nebulizer solution, Take 3 mLs (2.5 mg total) by nebulization every 4 (four) hours as needed for wheezing or shortness of breath., Disp: 75 mL, Rfl: 0   albuterol (VENTOLIN HFA) 108 (90 Base) MCG/ACT inhaler, Inhale 1-2 puffs into the lungs every 6 (six) hours as needed for wheezing or shortness of breath., Disp: 8 g, Rfl: 6   alum & mag hydroxide-simeth (MAALOX/MYLANTA) 200-200-20 MG/5ML suspension, Take 30 mLs by mouth as needed for indigestion or heartburn., Disp: , Rfl:    aspirin 325 MG EC tablet, Take 325 mg by mouth daily with breakfast. , Disp: , Rfl:    atorvastatin (LIPITOR) 80 MG tablet, Take 80 mg by mouth at bedtime. , Disp: , Rfl:    baclofen (LIORESAL) 20 MG tablet, Take 20 mg by mouth 3 (three) times daily., Disp: , Rfl:    clopidogrel (PLAVIX) 75 MG tablet, Take 75 mg by mouth daily with breakfast. , Disp: , Rfl:    dicyclomine (BENTYL) 10 MG capsule, Take 10 mg by mouth daily., Disp: , Rfl:    Glycopyrrolate-Formoterol (BEVESPI AEROSPHERE) 9-4.8 MCG/ACT AERO, Inhale 2 puffs into the lungs 2 (two) times daily., Disp: 10.7 g, Rfl: 6   guaifenesin (ROBITUSSIN) 100 MG/5ML syrup, Take 200 mg by mouth every 6 (six)  hours as needed for cough., Disp: , Rfl:    hydrALAZINE (APRESOLINE) 100 MG tablet, Take 100 mg by mouth 2 (two) times daily., Disp: , Rfl:    labetalol (NORMODYNE) 200 MG tablet, Take 200 mg by mouth 2 (two) times daily., Disp: , Rfl:    levETIRAcetam (KEPPRA) 750 MG tablet, Take 750 mg by mouth 2 (two) times daily., Disp: , Rfl:    levothyroxine (SYNTHROID, LEVOTHROID) 25 MCG tablet, Take 1 tablet by mouth daily., Disp: , Rfl:    loperamide (IMODIUM) 2 MG capsule, Take by mouth as needed for diarrhea or loose stools., Disp: , Rfl:    magnesium hydroxide (MILK OF MAGNESIA) 400 MG/5ML suspension, Take 30 mLs by mouth at bedtime as needed for mild constipation., Disp: , Rfl:    melatonin 3 MG TABS tablet, Take 3 mg by mouth at bedtime., Disp: , Rfl:    neomycin-bacitracin-polymyxin (NEOSPORIN) ointment, Apply 1 application topically as needed for wound care., Disp: , Rfl:    Polyethyl Glycol-Propyl Glycol (SYSTANE ULTRA) 0.4-0.3 % SOLN, Apply 1  drop to eye 3 (three) times daily., Disp: , Rfl:    polyethylene glycol powder (GLYCOLAX/MIRALAX) powder, Take 17 g by mouth 2 (two) times daily. Use for constipation, as prescribed, until daily soft stools  OTC (Patient taking differently: Take 17 g by mouth daily as needed for mild constipation or moderate constipation.), Disp: 119 g, Rfl: 0   sertraline (ZOLOFT) 100 MG tablet, Take 100 mg by mouth daily with breakfast. , Disp: , Rfl:    tamsulosin (FLOMAX) 0.4 MG CAPS capsule, Take 1 capsule (0.4 mg total) by mouth daily., Disp: 30 capsule, Rfl: 0   traZODone (DESYREL) 100 MG tablet, Take 100 mg by mouth at bedtime., Disp: , Rfl:

## 2021-07-19 ENCOUNTER — Encounter: Payer: Self-pay | Admitting: Pulmonary Disease

## 2021-07-21 ENCOUNTER — Telehealth: Payer: Self-pay | Admitting: Pulmonary Disease

## 2021-07-22 NOTE — Telephone Encounter (Signed)
Called and left voicemail for Asher Muir to call office back in regards to inhalers  ?

## 2021-07-23 NOTE — Progress Notes (Signed)
?  ?Cardiology Office Note ? ? ?Date:  07/24/2021  ? ?ID:  Daniel Oconnell, DOB February 01, 1956, MRN [id number] ? ?PCP:  Jeanette Caprice, PA-C  ?Cardiologist:   Minus Breeding, MD  ? ? ?Chief Complaint  ?Patient presents with  ? Shortness of Breath  ? ? ?  ?History of Present Illness: ?Daniel Oconnell is a 66 y.o. male who presents for follow-up of coronary disease. He had a history of bypass surgery in 2001 at [place].   He did have a stroke in 2001 as well.  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 He had carotid stenting.    ? ?He did have COVID in August 2020.   He was admitted in March 2021 with COPD exacerbation.  I reviewed these records for this visit.  He lives at a nursing home.  He does walk although he has right hemiparesis.   ? ?He came today because he has been having shortness of breath increased for the last 3 weeks.  He is chronically on oxygen but he has been having oxygen saturation he had had fallen into the 80s with walking even with his oxygen on.  He has physical therapy that works with him and does a little walking and stretching.  He is not having any new PND or orthopnea.  He is not having any new palpitations, presyncope or syncope.  He has had no new weight gain although he has some mild chronic edema.  He has not had any chest pressure, neck or arm discomfort.  He has not had any cough fevers or chills.  He did see his pulmonologist and his meds were changed recently.  Apparently a CT is going to be scheduled because of his smoking history. ? ? ?Past Medical History:  ?Diagnosis Date  ? 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 CAD (coronary artery disease) 2001  ? CABG  ? COPD (chronic obstructive pulmonary disease) ([place])   ? CVA (cerebral vascular accident) ([employer]) 2001, 2013  ? x2  ? Expressive aphasia   ? 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 Hyperlipidemia   ? Hypertension   ? ? ?Past Surgical History:  ?Procedure Laterality Date  ?  APPENDECTOMY    ? CORONARY ARTERY BYPASS GRAFT  2001  ? LUMBAR SPINE SURGERY    ? ? ? ?Current Outpatient Medications  ?Medication Sig Dispense Refill  ? acetaminophen (TYLENOL) 500 MG tablet Take 500 mg by mouth every 4 (four) hours as needed for mild pain, moderate pain, fever or headache.     ? albuterol (PROVENTIL) (2.5 MG/3ML) 0.083% nebulizer solution Take 3 mLs (2.5 mg total) by nebulization every 4 (four) hours as needed for wheezing or shortness of breath. 75 mL 0  ? albuterol (VENTOLIN HFA) 108 (90 Base) MCG/ACT inhaler Inhale 1-2 puffs into the lungs every 6 (six) hours as needed for wheezing or shortness of breath. 8 g 6  ? alum & mag hydroxide-simeth (MAALOX/MYLANTA) 867-619-50 MG/5ML suspension Take 30 mLs by mouth as needed for indigestion or heartburn.    ? aspirin 325 MG EC tablet Take 325 mg by mouth daily with breakfast.     ? atorvastatin (LIPITOR) 80 MG tablet Take 80 mg by mouth at bedtime.     ? baclofen (LIORESAL) 20 MG tablet Take 20 mg by mouth 3 (three) times daily.    ? clopidogrel (PLAVIX) 75 MG tablet Take 75 mg by mouth daily with  breakfast.     ? dicyclomine (BENTYL) 10 MG capsule Take 10 mg by mouth daily.    ? Glycopyrrolate-Formoterol (BEVESPI AEROSPHERE) 9-4.8 MCG/ACT AERO Inhale 2 puffs into the lungs 2 (two) times daily. 10.7 g 6  ? guaifenesin (ROBITUSSIN) 100 MG/5ML syrup Take 200 mg by mouth every 6 (six) hours as needed for cough.    ? hydrALAZINE (APRESOLINE) 100 MG tablet Take 100 mg by mouth 2 (two) times daily.    ? labetalol (NORMODYNE) 200 MG tablet Take 200 mg by mouth 2 (two) times daily.    ? levETIRAcetam (KEPPRA) 750 MG tablet Take 750 mg by mouth 2 (two) times daily.    ? levothyroxine (SYNTHROID, LEVOTHROID) 25 MCG tablet Take 1 tablet by mouth daily.    ? loperamide (IMODIUM) 2 MG capsule Take by mouth as needed for diarrhea or loose stools.    ? magnesium hydroxide (MILK OF MAGNESIA) 400 MG/5ML suspension Take 30 mLs by mouth at bedtime as needed for mild  constipation.    ? melatonin 3 MG TABS tablet Take 3 mg by mouth at bedtime.    ? neomycin-bacitracin-polymyxin (NEOSPORIN) ointment Apply 1 application topically as needed for wound care.    ? Polyethyl Glycol-Propyl Glycol (SYSTANE ULTRA) 0.4-0.3 % SOLN Apply 1 drop to eye 3 (three) times daily.    ? polyethylene glycol powder (GLYCOLAX/MIRALAX) powder Take 17 g by mouth 2 (two) times daily. Use for constipation, as prescribed, until daily soft stools ? ?OTC (Patient taking differently: Take 17 g by mouth daily as needed for mild constipation or moderate constipation.) 119 g 0  ? sertraline (ZOLOFT) 100 MG tablet Take 100 mg by mouth daily with breakfast.     ? tamsulosin (FLOMAX) 0.4 MG CAPS capsule Take 1 capsule (0.4 mg total) by mouth daily. 30 capsule 0  ? traZODone (DESYREL) 100 MG tablet Take 100 mg by mouth at bedtime.    ? ?No current facility-administered medications for this visit.  ? ? ?Allergies:   Clonidine and Sulfa antibiotics  ? ? ?ROS:  Please see the history of present illness.   Otherwise, review of systems are positive for none.   All other systems are reviewed and negative.  ? ? ?PHYSICAL EXAM: ?VS:  BP (!) 171/84   Pulse (!) 55   Ht '5\' 10"'  (1.778 m)   Wt 181 lb 9.6 oz (82.4 kg)   SpO2 91%   BMI 26.06 kg/m?  , BMI Body mass index is 26.06 kg/m?.  ?GENERAL:  Well appearing ?NECK:  No jugular venous distention, waveform within normal limits, carotid upstroke brisk and symmetric, no bruits, no thyromegaly ?LUNGS:  Clear to auscultation bilaterally ?CHEST:  Unremarkable ?HEART:  PMI not displaced or sustained,S1 and S2 within normal limits, no S3, no S4, no clicks, no rubs, no murmurs ?ABD:  Flat, positive bowel sounds normal in frequency in pitch, no bruits, no rebound, no guarding, no midline pulsatile mass, no hepatomegaly, no splenomegaly ?EXT:  2 plus pulses throughout, no edema, no cyanosis no clubbing back ?NEURO: Right hemiparesis ? ? ?EKG:  EKG is  ordered today. ?Sinus rhythm, rate  55, axis within normal limits, right bundle branch block, inferior Q waves possible old inferior infarct.  Right bundle branch block is new from previous. ? ? ?Recent Labs: ?No results found for requested labs within last 8760 hours.  ? ? ?Lipid Panel ?   ?Component Value Date/Time  ? CHOL 111 03/05/2020 1435  ? TRIG 139 03/05/2020 1435  ?  HDL 35 (L) 03/05/2020 1435  ? CHOLHDL 3.2 03/05/2020 1435  ? [place] 52 03/05/2020 1435  ? ?  ? ?Wt Readings from Last 3 Encounters:  ?07/24/21 181 lb 9.6 oz (82.4 kg)  ?07/18/21 180 lb (81.6 kg)  ?03/05/20 180 lb (81.6 kg)  ?  ? ? ?Other studies Reviewed: ?Additional studies/ records that were reviewed today include: Pulmonary notes ?Review of the above records demonstrates:  See elsewhere in the note.  ? ? ?ASSESSMENT AND PLAN: ? ?CAD: The patient has increasing shortness of breath and I will have a low threshold for perfusion testing.  I am going to give him a couple of weeks with their medicine change to see if the breathing gets any better as this likely is a pulmonary etiology.  However, if not they will let me know and I will consider Lexiscan Myoview. ? ?HTN:  The blood pressure is elevated today but I look at his blood pressure readings at his nursing home and they are very normal.  No change in therapy.  ? ?CVA:    He has a history of carotid stenting.  He will continue with risk reduction. ? ?SOB:   This will be worked up as above.  I will also check labs to include BNP and CBC and TSH.  Given the EKG changes and the shortness of breath I will be checking an echocardiogram. ? ?DYSLIPIDEMIA: We will check labs to include a c-Met and a fasting lipid profile. ? ?Current medicines are reviewed at length with the patient today.  The patient does not have concerns regarding medicines. ? ?The following changes have been made:  None ? ?Labs/ tests ordered today include:   ? ?Orders Placed This Encounter  ?Procedures  ? CBC with Differential/Platelet  ? TSH  ? Lipid panel  ?  Comprehensive metabolic panel  ? EKG 12-Lead  ? ECHOCARDIOGRAM COMPLETE  ? ? ? ?Disposition:   FU with APP  in 3 months.  ? ?Signed, ?Minus Breeding, MD  ?07/24/2021 11:06 AM    ?[place] ?

## 2021-07-23 NOTE — Telephone Encounter (Signed)
Called and spoke with Roselyn Reef. I advised her that JD wanted the patient to start the [place] and have the albuterol on hand to use if any SOB, wheezing or cough started after using the Bevespi. She verbalized understanding and will update the patient's files.  ? ?Nothing further needed at time of call.  ?

## 2021-07-24 ENCOUNTER — Encounter: Payer: Self-pay | Admitting: Cardiology

## 2021-07-24 ENCOUNTER — Ambulatory Visit: Payer: Medicare HMO | Admitting: Cardiology

## 2021-07-24 ENCOUNTER — Other Ambulatory Visit: Payer: Self-pay

## 2021-07-24 VITALS — BP 171/84 | HR 55 | Ht 70.0 in | Wt 181.6 lb

## 2021-07-24 DIAGNOSIS — R0602 Shortness of breath: Secondary | ICD-10-CM

## 2021-07-24 DIAGNOSIS — I2510 Atherosclerotic heart disease of native coronary artery without angina pectoris: Secondary | ICD-10-CM | POA: Diagnosis not present

## 2021-07-24 DIAGNOSIS — I639 Cerebral infarction, unspecified: Secondary | ICD-10-CM | POA: Diagnosis not present

## 2021-07-24 DIAGNOSIS — I10 Essential (primary) hypertension: Secondary | ICD-10-CM

## 2021-07-24 NOTE — Patient Instructions (Signed)
Medication Instructions:  ?Your physician recommends that you continue on your current medications as directed. Please refer to the Current Medication list given to you today.  ? ?*If you need a refill on your cardiac medications before your next appointment, please call your pharmacy* ? ?Lab Work: ?CBC/LP/CMET/TSH/BNP TODAY  ? ?If you have labs (blood work) drawn today and your tests are completely normal, you will receive your results only by: ?MyChart Message (if you have MyChart) OR ?A paper copy in the mail ?If you have any lab test that is abnormal or we need to change your treatment, we will call you to review the results. ? ?Testing/Procedures: ?Your physician has requested that you have an echocardiogram. Echocardiography is a painless test that uses sound waves to create images of your heart. It provides your doctor with information about the size and shape of your heart and how well your heart?s chambers and valves are working. This procedure takes approximately one hour. There are no restrictions for this procedure. ?[facility] AT [address] ? ?Follow-Up: ?At [facility], you and your health needs are our priority.  As part of our continuing mission to provide you with exceptional heart care, we have created designated Provider Care Teams.  These Care Teams include your primary Cardiologist (physician) and Advanced Practice Providers (APPs -  Physician Assistants and Nurse Practitioners) who all work together to provide you with the care you need, when you need it. ? ?We recommend signing up for the patient portal called "MyChart".  Sign up information is provided on this After Visit Summary.  MyChart is used to connect with patients for Virtual Visits (Telemedicine).  Patients are able to view lab/test results, encounter notes, upcoming appointments, etc.  Non-urgent messages can be sent to your provider as well.   ?To learn more about what you can do with MyChart, go to  [email or web address].   ? ?Your next appointment:   ?3 month(s) ? ?The format for your next appointment:   ?In Person ? ?Provider:   ?PA/NP  { ? ?  ?

## 2021-07-25 ENCOUNTER — Other Ambulatory Visit: Payer: Self-pay | Admitting: *Deleted

## 2021-07-25 DIAGNOSIS — Z87891 Personal history of nicotine dependence: Secondary | ICD-10-CM

## 2021-07-28 ENCOUNTER — Telehealth: Payer: Self-pay | Admitting: Cardiology

## 2021-07-28 NOTE — Telephone Encounter (Signed)
Was returning call for results. Please advise 

## 2021-07-28 NOTE — Telephone Encounter (Signed)
Called patient, spoke with wife. Gave results of lab work.  ? ?T3-T4 was added by lab, this may come through as a fax, will message over to primary RN to make aware of this so she can be aware of the paperwork. They are requesting it be sent to his facility once received.  ? ?

## 2021-07-29 ENCOUNTER — Telehealth: Payer: Self-pay | Admitting: Pulmonary Disease

## 2021-07-29 ENCOUNTER — Other Ambulatory Visit: Payer: Self-pay

## 2021-07-29 DIAGNOSIS — Z79899 Other long term (current) drug therapy: Secondary | ICD-10-CM

## 2021-07-29 NOTE — Telephone Encounter (Signed)
Called and spoke with Daniel Oconnell. She is aware that JD has signed the handicap placard. She is ok with me placing it in the mail. I confirmed her address. Will place in the mail today for her.  ? ?Nothing further needed at time of call.  ?

## 2021-07-30 ENCOUNTER — Other Ambulatory Visit: Payer: Self-pay | Admitting: *Deleted

## 2021-07-30 DIAGNOSIS — Z79899 Other long term (current) drug therapy: Secondary | ICD-10-CM

## 2021-07-31 LAB — T3: T3, Total: 126 ng/dL (ref 71–180)

## 2021-07-31 LAB — T4: T4, Total: 6.9 ug/dL (ref 4.5–12.0)

## 2021-08-04 ENCOUNTER — Other Ambulatory Visit: Payer: Self-pay

## 2021-08-04 ENCOUNTER — Ambulatory Visit (HOSPITAL_COMMUNITY): Payer: Medicare HMO | Attending: Cardiology

## 2021-08-04 DIAGNOSIS — I10 Essential (primary) hypertension: Secondary | ICD-10-CM | POA: Diagnosis present

## 2021-08-04 DIAGNOSIS — R0602 Shortness of breath: Secondary | ICD-10-CM | POA: Diagnosis present

## 2021-08-04 LAB — ECHOCARDIOGRAM COMPLETE
Area-P 1/2: 4.36 cm2
S' Lateral: 2.4 cm

## 2021-08-04 NOTE — Progress Notes (Unsigned)
Patient did not consent to the administration of Definity. 

## 2021-08-06 ENCOUNTER — Encounter: Payer: Self-pay | Admitting: *Deleted

## 2021-08-06 LAB — CBC WITH DIFFERENTIAL/PLATELET
Basophils Absolute: 0.1 10*3/uL (ref 0.0–0.2)
Basos: 1 %
EOS (ABSOLUTE): 0.2 10*3/uL (ref 0.0–0.4)
Eos: 2 %
Hematocrit: 43.7 % (ref 37.5–51.0)
Hemoglobin: 14.7 g/dL (ref 13.0–17.7)
Immature Grans (Abs): 0 10*3/uL (ref 0.0–0.1)
Immature Granulocytes: 0 %
Lymphocytes Absolute: 1.8 10*3/uL (ref 0.7–3.1)
Lymphs: 18 %
MCH: 28.4 pg (ref 26.6–33.0)
MCHC: 33.6 g/dL (ref 31.5–35.7)
MCV: 85 fL (ref 79–97)
Monocytes Absolute: 0.9 10*3/uL (ref 0.1–0.9)
Monocytes: 9 %
Neutrophils Absolute: 7.1 10*3/uL — ABNORMAL HIGH (ref 1.4–7.0)
Neutrophils: 70 %
Platelets: 204 10*3/uL (ref 150–450)
RBC: 5.17 x10E6/uL (ref 4.14–5.80)
RDW: 13.5 % (ref 11.6–15.4)
WBC: 10 10*3/uL (ref 3.4–10.8)

## 2021-08-06 LAB — COMPREHENSIVE METABOLIC PANEL
ALT: 19 IU/L (ref 0–44)
AST: 15 IU/L (ref 0–40)
Albumin/Globulin Ratio: 1.9 (ref 1.2–2.2)
Albumin: 4.1 g/dL (ref 3.8–4.8)
Alkaline Phosphatase: 99 IU/L (ref 44–121)
BUN/Creatinine Ratio: 10 (ref 10–24)
BUN: 10 mg/dL (ref 8–27)
Bilirubin Total: 0.5 mg/dL (ref 0.0–1.2)
Calcium: 9.1 mg/dL (ref 8.6–10.2)
Chloride: 101 mmol/L (ref 96–106)
Creatinine, Ser: 0.97 mg/dL (ref 0.76–1.27)
Globulin, Total: 2.2 g/dL (ref 1.5–4.5)
Glucose: 87 mg/dL (ref 70–99)
Potassium: 4 mmol/L (ref 3.5–5.2)
Sodium: 143 mmol/L (ref 134–144)
Total Protein: 6.3 g/dL (ref 6.0–8.5)
eGFR: 85 mL/min/{1.73_m2} (ref >59)

## 2021-08-06 LAB — LIPID PANEL
Chol/HDL Ratio: 2.9 ratio (ref 0.0–5.0)
Cholesterol, Total: 112 mg/dL (ref 100–199)
HDL: 38 mg/dL — ABNORMAL LOW (ref >39)
LDL Chol Calc (NIH): 59 mg/dL (ref 0–99)
Triglycerides: 73 mg/dL (ref 0–149)
VLDL Cholesterol Cal: 15 mg/dL (ref 5–40)

## 2021-08-06 LAB — TSH: TSH: 9.16 u[IU]/mL — ABNORMAL HIGH (ref 0.450–4.500)

## 2021-08-06 NOTE — Telephone Encounter (Signed)
Left message for pt to call.

## 2021-08-06 NOTE — Telephone Encounter (Signed)
-----   Message from Rollene Rotunda, MD sent at 08/06/2021 10:12 AM EDT ----- ?His ejection fraction is okay.  He has some moderate left ventricular hypertrophy probably related to hypertension.  I was to get a BNP level but I do not see that this was drawn.  Could we please have this drawn and call patient with results of the echo.  Send results to Kurth-Bowen, Cornelia, PA-C ?

## 2021-08-07 ENCOUNTER — Other Ambulatory Visit: Payer: Self-pay | Admitting: *Deleted

## 2021-08-07 DIAGNOSIS — R0602 Shortness of breath: Secondary | ICD-10-CM

## 2021-08-07 NOTE — Telephone Encounter (Signed)
This encounter was created in error - please disregard.

## 2021-08-08 ENCOUNTER — Telehealth: Payer: Self-pay | Admitting: Cardiology

## 2021-08-08 LAB — PRO B NATRIURETIC PEPTIDE: NT-Pro BNP: 3009 pg/mL — ABNORMAL HIGH (ref 0–376)

## 2021-08-08 NOTE — Telephone Encounter (Signed)
Spoke with pt's caregiver Darl Pikes, explained that Dr. Antoine Poche has not commented on the results of pt's most recent blood work. Explained that we results are available we can give her a call back to discuss next steps.  Darl Pikes would like echo and all recent lab work sent to [facility] (fax: [phone]). Will forward to Dr. Antoine Poche.  ?

## 2021-08-08 NOTE — Telephone Encounter (Signed)
Darl Pikes, his caretaker would like patient's lab results.   ?She also like all lab and echo results sent to the [facility] that is where he is at.  ?

## 2021-08-11 ENCOUNTER — Ambulatory Visit: Payer: Medicare HMO | Admitting: Cardiology

## 2021-08-11 NOTE — Telephone Encounter (Signed)
Daniel Oconnell was calling back for update and to be able to pick up the results. Please advise ?

## 2021-08-11 NOTE — Telephone Encounter (Signed)
Spoke with patient's ex-wife about results. She reports his O2 does fine when sitting. When he gets up to move around, even with oxygen, home O2 dropped to 79-80% with exertion. He is having to stop more often to catch his breath. He is having NO weight gain, NO swelling.  ? ?Scheduled for visit on 08/14/21 @ 330pm ? ?Wife requested labs, echo, notes to be faxed to [facility] -- called and got fax # [phone] ?Ex-wife states that MD at [facility] is onsite today  ?

## 2021-08-11 NOTE — Telephone Encounter (Signed)
Lab results: ?Rollene Rotunda, MD  ?08/10/2021  9:30 PM EDT Back to Top  ?  ?BNP was elevated.  I would like to see him back sooner than 3 months.  Please move up his appt.  Thanks.  Send results to Kurth-Bowen, Cornelia, PA-C  ? ?T3/T4: ? Rollene Rotunda, MD  ?07/31/2021  1:35 PM EDT   ?  ?Labs OK.  Call Mr. Critcher with the results and send results to Kurth-Bowen, Cornelia, PA-C  ? ? ?Left message for wife to return call ?

## 2021-08-11 NOTE — Telephone Encounter (Signed)
Office visit, echo and recent labs faxed to the number provided. ?

## 2021-08-12 ENCOUNTER — Other Ambulatory Visit: Payer: Self-pay

## 2021-08-12 ENCOUNTER — Ambulatory Visit
Admission: RE | Admit: 2021-08-12 | Discharge: 2021-08-12 | Disposition: A | Discharge status: Home / Self Care | Payer: Medicare HMO | Source: Ambulatory Visit | Attending: Cardiology | Admitting: Cardiology

## 2021-08-12 ENCOUNTER — Other Ambulatory Visit: Payer: Self-pay | Admitting: *Deleted

## 2021-08-12 ENCOUNTER — Telehealth: Payer: Self-pay | Admitting: Pulmonary Disease

## 2021-08-12 ENCOUNTER — Ambulatory Visit (INDEPENDENT_AMBULATORY_CARE_PROVIDER_SITE_OTHER): Payer: Medicare HMO | Admitting: Acute Care

## 2021-08-12 ENCOUNTER — Encounter: Payer: Self-pay | Admitting: Acute Care

## 2021-08-12 DIAGNOSIS — R0602 Shortness of breath: Secondary | ICD-10-CM

## 2021-08-12 DIAGNOSIS — Z87891 Personal history of nicotine dependence: Secondary | ICD-10-CM

## 2021-08-12 NOTE — Telephone Encounter (Signed)
Spoke with wife. Order for 2-view CXR placed. Wife taking patient to [facility] imaging at [address] this morning. ?

## 2021-08-12 NOTE — Telephone Encounter (Signed)
?  Pt's wife calling, she would like to speak with a nurse regarding pt's xray. She wanted to know if its easier for them to go to one of the imaging place or urgent care. She ask if she can call her back asap ?

## 2021-08-12 NOTE — Telephone Encounter (Signed)
Spoke with pt wife, she would like to go ahead and do the cxr so we can have the results when the patient is seen. ?

## 2021-08-12 NOTE — Patient Instructions (Signed)
Thank you for participating in the  Lung Cancer Screening Program. °It was our pleasure to meet you today. °We will call you with the results of your scan within the next few days. °Your scan will be assigned a Lung RADS category score by the physicians reading the scans.  °This Lung RADS score determines follow up scanning.  °See below for description of categories, and follow up screening recommendations. °We will be in touch to schedule your follow up screening annually or based on recommendations of our providers. °We will fax a copy of your scan results to your Primary Care Physician, or the physician who referred you to the program, to ensure they have the results. °Please call the office if you have any questions or concerns regarding your scanning experience or results.  °Our office number is [phone]. °Please speak with Denise Phelps, RN. She is our Lung Cancer Screening RN. °If she is unavailable when you call, please have the office staff send her a message. She will return your call at her earliest convenience. °Remember, if your scan is normal, we will scan you annually as long as you continue to meet the criteria for the program. (Age 55-77, Current smoker or smoker who has quit within the last 15 years). °If you are a smoker, remember, quitting is the single most powerful action that you can take to decrease your risk of lung cancer and other pulmonary, breathing related problems. °We know quitting is hard, and we are here to help.  °Please let us know if there is anything we can do to help you meet your goal of quitting. °If you are a former smoker, congratulations. We are proud of you! Remain smoke free! °Remember you can refer friends or family members through the number above.  °We will screen them to make sure they meet criteria for the program. °Thank you for helping us take better care of you by participating in Lung Screening. ° °You can receive free nicotine replacement therapy  ( patches, gum or mints) by calling 1-800-QUIT NOW. Please call so we can get you on the path to becoming  a non-smoker. I know it is hard, but you can do this! ° °Lung RADS Categories: ° °Lung RADS 1: no nodules or definitely non-concerning nodules.  °Recommendation is for a repeat annual scan in 12 months. ° °Lung RADS 2:  nodules that are non-concerning in appearance and behavior with a very low likelihood of becoming an active cancer. °Recommendation is for a repeat annual scan in 12 months. ° °Lung RADS 3: nodules that are probably non-concerning , includes nodules with a low likelihood of becoming an active cancer.  Recommendation is for a 6-month repeat screening scan. Often noted after an upper respiratory illness. We will be in touch to make sure you have no questions, and to schedule your 6-month scan. ° °Lung RADS 4 A: nodules with concerning findings, recommendation is most often for a follow up scan in 3 months or additional testing based on our provider's assessment of the scan. We will be in touch to make sure you have no questions and to schedule the recommended 3 month follow up scan. ° °Lung RADS 4 B:  indicates findings that are concerning. We will be in touch with you to schedule additional diagnostic testing based on our provider's  assessment of the scan. ° °Hypnosis for smoking cessation  °[employer] °[phone] ° °Acupuncture for smoking cessation  °[facility] °[phone]  °

## 2021-08-12 NOTE — Progress Notes (Signed)
?Virtual Visit via Telephone Note ? ?I connected with Daniel Oconnell on 08/12/21 at 11:30 AM EDT by telephone and verified that I am speaking with the correct person using two identifiers. ? ?Location: ?Patient:  At home ?Provider: Working from home ?  ?I discussed the limitations, risks, security and privacy concerns of performing an evaluation and management service by telephone and the availability of in person appointments. I also discussed with the patient that there may be a patient responsible charge related to this service. The patient expressed understanding and agreed to proceed. ? ? ? ?Shared Decision Making Visit Lung Cancer Screening Program ?([phone]) ? ? ?Eligibility: ?Age 66 y.o. ?Pack Years Smoking History Calculation 76 pack year smoking history ?(# packs/per year x # years smoked) ?Recent History of coughing up blood  no ?Unexplained weight loss? no ?( >Than 15 pounds within the last 6 months ) ?Prior History Lung / other cancer no ?(Diagnosis within the last 5 years already requiring surveillance chest CT Scans). ?Smoking Status Former Smoker ?Former Smokers: Years since quit: 10 years ? Quit Date: 2013 ? ?Visit Components: ?Discussion included one or more decision making aids. yes ?Discussion included risk/benefits of screening. yes ?Discussion included potential follow up diagnostic testing for abnormal scans. yes ?Discussion included meaning and risk of over diagnosis. yes ?Discussion included meaning and risk of False Positives. yes ?Discussion included meaning of total radiation exposure. yes ? ?Counseling Included: ?Importance of adherence to annual lung cancer LDCT screening. yes ?Impact of comorbidities on ability to participate in the program. yes ?Ability and willingness to under diagnostic treatment. yes ? ?Smoking Cessation Counseling: ?Current Smokers:  ?Discussed importance of smoking cessation. yes ?Information about tobacco cessation classes and interventions provided to patient.  yes ?Patient provided with "ticket" for LDCT Scan.  NA ?Symptomatic Patient. no ? Counseling NA ?Diagnosis Code: Tobacco Use Z72.0 ?Asymptomatic Patient yes ? Counseling (Intermediate counseling: > three minutes counseling) A4166 ?Former Smokers:  ?Discussed the importance of maintaining cigarette abstinence. yes ?Diagnosis Code: Personal History of Nicotine Dependence. A63.016 ?Information about tobacco cessation classes and interventions provided to patient. Yes ?Patient provided with "ticket" for LDCT Scan. yes ?Written Order for Lung Cancer Screening with LDCT placed in Epic. Yes ?(CT Chest Lung Cancer Screening Low Dose W/O CM) [id number] ?Z12.2-Screening of respiratory organs ?Z87.891-Personal history of nicotine dependence ? ?I spent 25 minutes of face to face time/virtual visit time  with [name] and his wife  discussing the risks and benefits of lung cancer screening. We took the time to pause the power point at intervals to allow for questions to be asked and answered to ensure understanding. We discussed that he had taken the single most powerful action possible to decrease his risk of developing lung cancer when he quit smoking. I counseled him to remain smoke free, and to contact me if he ever had the desire to smoke again so that I can provide resources and tools to help support the effort to remain smoke free. We discussed the time and location of the scan, and that either  [name] RN, [name], RN or I  or I will call / send a letter with the results within  24-72 hours of receiving them.  has the office contact information in the event  he needs to speak with me,  he verbalized understanding of all of the above and had no further questions upon leaving the office.  ? ? ? ?I explained to the patient that there has been a  high incidence of coronary artery disease noted on these exams. I explained that this is a non-gated exam therefore degree or severity cannot be determined. This patient is  on statin therapy. I have asked the patient to follow-up with their PCP regarding any incidental finding of coronary artery disease and management with diet or medication as they feel is clinically indicated. The patient verbalized understanding of the above and had no further questions. ?  ? ? ?[name], NP ?08/12/2021 ? ? ? ? ? ? ?

## 2021-08-12 NOTE — Telephone Encounter (Signed)
Called and spoke with patient wife Darl Pikes. Darl Pikes stated patient was seen by Dr. Antoine Poche after Dr. Francine Graven.  Darl Pikes stated labs were drawn and cardiology called to let them know labs showed fluid and that may be causing increased sob.  Patient is scheduled CXR this morning at [facility]. ?Darl Pikes wanted Dr. Francine Graven aware of BNP and CXR. ? ?Message routed to Dr. Francine Graven as Lorain Childes ?

## 2021-08-13 DIAGNOSIS — R0602 Shortness of breath: Secondary | ICD-10-CM | POA: Insufficient documentation

## 2021-08-13 DIAGNOSIS — R0609 Other forms of dyspnea: Secondary | ICD-10-CM | POA: Insufficient documentation

## 2021-08-13 NOTE — Telephone Encounter (Signed)
Darl Pikes is not listed on DPR.  ?Spoke to patient  ? ?Called [facility] at [phone] and spoke to patient. He gave verbal to speak with his wife.  ? ? ?Spoke to Rayle. She stated that Dr. Falls Lions ordered labs and CXR. She would like Dr. Francine Graven to review this.  ? ?Dr. Francine Graven, please advise. Thanks ? ?

## 2021-08-13 NOTE — Progress Notes (Signed)
?  ?Cardiology Office Note ? ? ?Date:  08/14/2021  ? ?ID:  Daniel Oconnell, DOB Feb 27, 1956, MRN [id number] ? ?PCP:  Uvaldo BristleKurth-Bowen, Cornelia, PA-C  ?Cardiologist:   Rollene RotundaJames Maeola Mchaney, MD  ? ? ?Chief Complaint  ?Patient presents with  ? Shortness of Breath  ? ? ?  ?History of Present Illness: ?Daniel Oconnell is a 66 y.o. male who presents for follow-up of coronary disease. He had a history of bypass surgery in 2001 at [facility].   He did have a stroke in 2001 as well.  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 He had carotid stenting.    ? ?He did have COVID in August 2020.   He was admitted in March 2021 with COPD exacerbation.  I reviewed these records for this visit.  He lives at a nursing home.  He does walk although he has right hemiparesis.   ? ?I saw him recently and he was having shortness of breath .  BNP was elevated.  Echocardiogram demonstrated some moderate left ventricular hypertrophy.  He had a well-preserved ejection fraction and no evidence of significant valvular abnormalities.  CXR yesterday demonstrated possible CHF vs multifocal infection.    ? ?He is currently on 4 L of oxygen.  He went to occupational therapy today and they had to go up on this.  He has been running with oxygen sats in the low 80s when he moves around which is lower than it was previously.  He comes back at rest and his levels go up into the 90s.  He sleeps in a lazy boy.  That has not changed.  He sleeps with his head elevated.  He has not had any new swelling.  He is not having any cough fevers or chills.  He is having no chest pressure, neck or arm discomfort. ? ? ?Past Medical History:  ?Diagnosis Date  ? 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 CAD (coronary artery disease) 2001  ? CABG  ? COPD (chronic obstructive pulmonary disease) (HCC)   ? CVA (cerebral vascular accident) (HCC) 2001, 2013  ? x2  ? Expressive aphasia   ? 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 Hyperlipidemia   ?  Hypertension   ? ? ?Past Surgical History:  ?Procedure Laterality Date  ? APPENDECTOMY    ? CORONARY ARTERY BYPASS GRAFT  2001  ? LUMBAR SPINE SURGERY    ? ? ? ?Current Outpatient Medications  ?Medication Sig Dispense Refill  ? albuterol (VENTOLIN HFA) 108 (90 Base) MCG/ACT inhaler Inhale 1-2 puffs into the lungs every 6 (six) hours as needed for wheezing or shortness of breath. 8 g 6  ? atorvastatin (LIPITOR) 80 MG tablet Take 80 mg by mouth at bedtime.     ? baclofen (LIORESAL) 20 MG tablet Take 20 mg by mouth 3 (three) times daily.    ? clopidogrel (PLAVIX) 75 MG tablet Take 75 mg by mouth daily with breakfast.     ? dicyclomine (BENTYL) 10 MG capsule Take 10 mg by mouth daily.    ? Glycopyrrolate-Formoterol (BEVESPI AEROSPHERE) 9-4.8 MCG/ACT AERO Inhale 2 puffs into the lungs 2 (two) times daily. 10.7 g 6  ? labetalol (NORMODYNE) 200 MG tablet Take 200 mg by mouth 2 (two) times daily.    ? levETIRAcetam (KEPPRA) 750 MG tablet Take 750 mg by mouth 2 (two) times daily.    ? levothyroxine (SYNTHROID, LEVOTHROID) 25 MCG tablet Take 1 tablet  by mouth daily.    ? melatonin 3 MG TABS tablet Take 3 mg by mouth at bedtime.    ? mometasone (NASONEX) 50 MCG/ACT nasal spray SMARTSIG:Both Nares    ? sertraline (ZOLOFT) 100 MG tablet Take 100 mg by mouth daily with breakfast.     ? tamsulosin (FLOMAX) 0.4 MG CAPS capsule Take 1 capsule (0.4 mg total) by mouth daily. 30 capsule 0  ? traZODone (DESYREL) 100 MG tablet Take 100 mg by mouth at bedtime.    ? acetaminophen (TYLENOL) 500 MG tablet Take 500 mg by mouth every 4 (four) hours as needed for mild pain, moderate pain, fever or headache.  (Patient not taking: Reported on 08/14/2021)    ? albuterol (PROVENTIL) (2.5 MG/3ML) 0.083% nebulizer solution Take 3 mLs (2.5 mg total) by nebulization every 4 (four) hours as needed for wheezing or shortness of breath. (Patient not taking: Reported on 08/14/2021) 75 mL 0  ? alum & mag hydroxide-simeth (MAALOX/MYLANTA) 200-200-20 MG/5ML  suspension Take 30 mLs by mouth as needed for indigestion or heartburn. (Patient not taking: Reported on 08/14/2021)    ? aspirin 325 MG EC tablet Take 325 mg by mouth daily with breakfast.  (Patient not taking: Reported on 08/14/2021)    ? furosemide (LASIX) 40 MG tablet Take 1 tablet (40 mg total) by mouth daily. 90 tablet 3  ? guaifenesin (ROBITUSSIN) 100 MG/5ML syrup Take 200 mg by mouth every 6 (six) hours as needed for cough. (Patient not taking: Reported on 08/14/2021)    ? hydrALAZINE (APRESOLINE) 100 MG tablet Take 1 tablet (100 mg total) by mouth 3 (three) times daily. 270 tablet 3  ? loperamide (IMODIUM) 2 MG capsule Take by mouth as needed for diarrhea or loose stools. (Patient not taking: Reported on 08/14/2021)    ? magnesium hydroxide (MILK OF MAGNESIA) 400 MG/5ML suspension Take 30 mLs by mouth at bedtime as needed for mild constipation. (Patient not taking: Reported on 08/14/2021)    ? neomycin-bacitracin-polymyxin (NEOSPORIN) ointment Apply 1 application topically as needed for wound care. (Patient not taking: Reported on 08/14/2021)    ? Polyethyl Glycol-Propyl Glycol (SYSTANE ULTRA) 0.4-0.3 % SOLN Apply 1 drop to eye 3 (three) times daily. (Patient not taking: Reported on 08/14/2021)    ? polyethylene glycol powder (GLYCOLAX/MIRALAX) powder Take 17 g by mouth 2 (two) times daily. Use for constipation, as prescribed, until daily soft stools ? ?OTC (Patient not taking: Reported on 08/14/2021) 119 g 0  ? ?No current facility-administered medications for this visit.  ? ? ?Allergies:   Clonidine and Sulfa antibiotics  ? ? ?ROS:  Please see the history of present illness.   Otherwise, review of systems are positive for none.   All other systems are reviewed and negative.  ? ? ?PHYSICAL EXAM: ?VS:  BP (!) 162/64   Pulse (!) 57   Ht 5\' 10"  (1.778 m)   Wt 181 lb 3.2 oz (82.2 kg)   SpO2 95%   BMI 26.00 kg/m?  , BMI Body mass index is 26 kg/m?.  ?GEN:  No distress ?NECK:  No jugular venous distention at 90  degrees, waveform within normal limits, carotid upstroke brisk and symmetric, no bruits, no thyromegaly ?LYMPHATICS:  No cervical adenopathy ?LUNGS:  Clear to auscultation bilaterally ?BACK:  No CVA tenderness ?CHEST:  Unremarkable ?HEART:  S1 and S2 within normal limits, no S3, no S4, no clicks, no rubs, no murmurs ?ABD:  Positive bowel sounds normal in frequency in pitch, no bruits, no  rebound, no guarding, unable to assess midline mass or bruit with the patient seated. ?EXT:  2 plus pulses throughout, no edema, no cyanosis no clubbing ?SKIN:  No rashes no nodules ?NEURO:  Cranial nerves II through XII grossly intact, motor grossly intact throughout ?PSYCH:  Cognitively intact, oriented to person place and time ? ? ?EKG:  EKG is not ordered today. ?NA ? ? ?Recent Labs: ?07/24/2021: ALT 19; BUN 10; Creatinine, Ser 0.97; Hemoglobin 14.7; Platelets 204; Potassium 4.0; Sodium 143; TSH 9.160 ?08/07/2021: NT-Pro BNP 3,009  ? ? ?Lipid Panel ?   ?Component Value Date/Time  ? CHOL 112 07/24/2021 1113  ? TRIG 73 07/24/2021 1113  ? HDL 38 (L) 07/24/2021 1113  ? CHOLHDL 2.9 07/24/2021 1113  ? LDLCALC 59 07/24/2021 1113  ? ?  ? ?Wt Readings from Last 3 Encounters:  ?08/14/21 181 lb 3.2 oz (82.2 kg)  ?07/24/21 181 lb 9.6 oz (82.4 kg)  ?07/18/21 180 lb (81.6 kg)  ?  ? ? ?Other studies Reviewed: ?Additional studies/ records that were reviewed today include:CXR, labs ?Review of the above records demonstrates:  See elsewhere in the note.  ? ? ?ASSESSMENT AND PLAN: ? ?CAD: At this point I am not strongly suspecting an ischemic etiology but I will have a low threshold for perfusion testing in the future if his symptoms persist.   ? ?HTN:  The blood pressure is elevated.  It was last time as well.  I am going to increase his hydralazine to 100 mg 3 times daily.  I am going to ask his caregivers to check his blood pressure frequently for the next couple of weeks and send those results to me. ? ?CVA:   He has a history of carotid stenting.   He continues with risk reduction. ? ?SOB:   He does have evidence of volume overload between the BNP and the chest x-ray.  He eats soft by eating out 3 times a week and I asked him to try to curb this.  I am goi

## 2021-08-13 NOTE — Telephone Encounter (Signed)
Patients wife called, and wanted to inform Dr Francine Graven, that her husband Daniel Oconnell, blood was drawn from Dr Tiney Rouge office, and the results were back but she does not understand how to read them. ?

## 2021-08-14 ENCOUNTER — Ambulatory Visit: Payer: Medicare HMO | Admitting: Cardiology

## 2021-08-14 ENCOUNTER — Encounter: Payer: Self-pay | Admitting: Cardiology

## 2021-08-14 VITALS — BP 162/64 | HR 57 | Ht 70.0 in | Wt 181.2 lb

## 2021-08-14 DIAGNOSIS — R0602 Shortness of breath: Secondary | ICD-10-CM | POA: Diagnosis not present

## 2021-08-14 DIAGNOSIS — I639 Cerebral infarction, unspecified: Secondary | ICD-10-CM

## 2021-08-14 DIAGNOSIS — I2510 Atherosclerotic heart disease of native coronary artery without angina pectoris: Secondary | ICD-10-CM | POA: Diagnosis not present

## 2021-08-14 DIAGNOSIS — I10 Essential (primary) hypertension: Secondary | ICD-10-CM

## 2021-08-14 MED ORDER — HYDRALAZINE HCL 100 MG PO TABS: 100.0000 mg | ORAL_TABLET | Freq: Three times a day (TID) | ORAL | 3 refills | Status: AC

## 2021-08-14 MED ORDER — HYDRALAZINE HCL 100 MG PO TABS: 100.0000 mg | ORAL_TABLET | Freq: Three times a day (TID) | ORAL | 0 refills | Status: DC

## 2021-08-14 MED ORDER — FUROSEMIDE 40 MG PO TABS: 40.0000 mg | ORAL_TABLET | Freq: Every day | ORAL | 3 refills | Status: DC

## 2021-08-14 MED ORDER — FUROSEMIDE 40 MG PO TABS: 40.0000 mg | ORAL_TABLET | Freq: Every day | ORAL | 0 refills | Status: DC

## 2021-08-14 NOTE — Telephone Encounter (Signed)
The physician who ordered these tests has been in touch with the family.  ? ?With the BNP elevated and concern for fluid on the lungs based on chest x-ray, this appears to be an issue with his heart.  ? ?If he has increased sputum production along with fever, chills, sweats or poor appetite there may be concern for infection. ? ?Thanks, ?JD ?

## 2021-08-14 NOTE — Patient Instructions (Signed)
Medication Instructions:  ?INCREASE the Hydralazine to 100 mg three times daily ? ?START the Furosemide 40 mg once daily ? ?*If you need a refill on your cardiac medications before your next appointment, please call your pharmacy* ? ? ?Lab Work: ?Your provider would like for you to return in 2 weeks to have the following labs drawn: BMET. You do not need an appointment for the lab. Once in our office lobby there is a podium where you can sign in and ring the doorbell to alert [place] that you are here. The lab is open from 8:00 am to 4 pm; closed for lunch from 12:45pm-1:45pm. ? ?You may also go to any of these [facility] locations: ?  ?[place] ?- [address] ([facility]) ?- [address] ?- [address] ? ? ?If you have labs (blood work) drawn today and your tests are completely normal, you will receive your results only by: ?MyChart Message (if you have MyChart) OR ?A paper copy in the mail ?If you have any lab test that is abnormal or we need to change your treatment, we will call you to review the results. ? ? ?Testing/Procedures: ?None ordered ? ? ?Follow-Up: ?At [facility], you and your health needs are our priority.  As part of our continuing mission to provide you with exceptional heart care, we have created designated Provider Care Teams.  These Care Teams include your primary Cardiologist (physician) and Advanced Practice Providers (APPs -  Physician Assistants and Nurse Practitioners) who all work together to provide you with the care you need, when you need it. ? ?We recommend signing up for the patient portal called "MyChart".  Sign up information is provided on this After Visit Summary.  MyChart is used to connect with patients for Virtual Visits (Telemedicine).  Patients are able to view lab/test results, encounter notes, upcoming appointments, etc.  Non-urgent messages can be sent to your provider as well.   ?To learn more about what you can  do with MyChart, go to [email or web address].   ? ?Your next appointment:   ?2 month(s) ? ?The format for your next appointment:   ?In Person ? ?Provider:   ?Joni Reining, DNP ? ?Other Instructions ?Dr. Antoine Poche would like you to check your blood pressure 3 times daily for the next 3 weeks.  Keep a journal of these daily blood pressure and heart rate readings and call our office or send a message through MyChart with the results. Thank you! ? ?It is best to check your BP 1-2 hours after taking your medications to see the medications effectiveness on your BP.  ?  ?Here are some tips that our clinical pharmacists share for home BP monitoring: ??         Rest 10 minutes before taking your blood pressure. ??         Don't smoke or drink caffeinated beverages for at least 30 minutes before. ??         Take your blood pressure before (not after) you eat. ??         Sit comfortably with your back supported and both feet on the floor (don't cross your legs). ??         Elevate your arm to heart level on a table or a desk. ??         Use the proper sized cuff. It should fit smoothly and snugly around your bare upper arm. There should  be enough room to slip a fingertip under the cuff. The bottom edge of the cuff should be 1 inch above the crease of the elbow. ? ? ?

## 2021-08-15 ENCOUNTER — Ambulatory Visit (INDEPENDENT_AMBULATORY_CARE_PROVIDER_SITE_OTHER)
Admission: RE | Admit: 2021-08-15 | Discharge: 2021-08-15 | Disposition: A | Discharge status: Home / Self Care | Payer: Medicare HMO | Source: Ambulatory Visit | Attending: Acute Care | Admitting: Acute Care

## 2021-08-15 DIAGNOSIS — Z87891 Personal history of nicotine dependence: Secondary | ICD-10-CM

## 2021-08-15 NOTE — Telephone Encounter (Signed)
Called and spoke with pt's spouse Manuela Schwartz letting her know the info per JD and she verbalized understanding. Nothing further needed. ?

## 2021-08-15 NOTE — Telephone Encounter (Signed)
Darl Pikes wife states patient has fluid on his lungs. Would like to know if patient needs appointment. Darl Pikes phone number is [phone]. ?

## 2021-08-19 ENCOUNTER — Other Ambulatory Visit: Payer: Self-pay

## 2021-08-19 DIAGNOSIS — Z122 Encounter for screening for malignant neoplasm of respiratory organs: Secondary | ICD-10-CM

## 2021-08-19 DIAGNOSIS — Z87891 Personal history of nicotine dependence: Secondary | ICD-10-CM

## 2021-09-05 ENCOUNTER — Telehealth: Payer: Self-pay | Admitting: Cardiology

## 2021-09-05 ENCOUNTER — Other Ambulatory Visit: Payer: Self-pay

## 2021-09-05 LAB — BASIC METABOLIC PANEL
BUN/Creatinine Ratio: 10 (ref 10–24)
BUN: 10 mg/dL (ref 8–27)
CO2: 31 mmol/L — ABNORMAL HIGH (ref 20–29)
Calcium: 9.2 mg/dL (ref 8.6–10.2)
Chloride: 99 mmol/L (ref 96–106)
Creatinine, Ser: 1 mg/dL (ref 0.76–1.27)
Glucose: 90 mg/dL (ref 70–99)
Potassium: 3.4 mmol/L — ABNORMAL LOW (ref 3.5–5.2)
Sodium: 144 mmol/L (ref 134–144)
eGFR: 82 mL/min/{1.73_m2} (ref >59)

## 2021-09-05 MED ORDER — POTASSIUM CHLORIDE CRYS ER 20 MEQ PO TBCR: 20.0000 meq | EXTENDED_RELEASE_TABLET | Freq: Every day | ORAL | 3 refills | Status: DC

## 2021-09-05 NOTE — Telephone Encounter (Signed)
Spouse had a question about [facility] and blood work. informed spouse that any [facility] personnel can see orders for blood work. The patient did get the ordered BMET done yesterday. ?

## 2021-09-05 NOTE — Telephone Encounter (Signed)
Patient's wife states the patient had repeat lab work yesterday with a different [facility] and she assumes they drew the wrong panel. She states a BMET was taken, but she wanted to be sure the patient does not need additional labs. ?

## 2021-09-10 ENCOUNTER — Telehealth: Payer: Self-pay | Admitting: Cardiology

## 2021-09-10 NOTE — Telephone Encounter (Signed)
Patient is needing a smaller pill due to difficulty swallowing. The facility wants to know if there is a smaller pill available.  ?

## 2021-09-10 NOTE — Telephone Encounter (Signed)
Pt c/o medication issue: ? ?1. Name of Medication: Potassium Chloride ? ?2. How are you currently taking this medication (dosage and times per day)? 1 ?Tablet daily ? ?3. Are you having a reaction (difficulty breathing--STAT)?  ? ?4. What is your medication issue? Wants to know if this medicine comes in a smaller pill, patient is having a hard time swallowing it- please fax the new prescription for whatever medicine he decides to prescribe to [facility] , fax number is [phone] ? ?

## 2021-09-12 NOTE — Telephone Encounter (Signed)
Called and spoke to the new at [facility]. She has been made aware and verbalized her understanding. ?Minus Breeding, MD  You 2 days ago  ? ?They can crush potassium into apple sauce.   ? ? ?

## 2021-10-08 ENCOUNTER — Telehealth: Payer: Self-pay | Admitting: Cardiology

## 2021-10-08 NOTE — Telephone Encounter (Signed)
Pt c/o medication issue:  1. Name of Medication: labetalol (NORMODYNE) 200 MG tablet  2. How are you currently taking this medication (dosage and times per day)? 3x's daily  3. Are you having a reaction (difficulty breathing--STAT)?   4. What is your medication issue? Pharmacy called and said it makes his COPD worse.

## 2021-10-09 NOTE — Telephone Encounter (Signed)
Pt daughter-susan (DPR) informed of providers result & recommendations. Pt verbalized understanding. No further questions .  Notified [employer] she needs a written order faxed to her with BP parameters, how long/often to take BP and to stop labetalol . Fax letter to [phone].

## 2021-10-09 NOTE — Telephone Encounter (Signed)
This is not an uncommon side effect of beta blockers, especially the non-cardioselective.  Will forward to Dr. Percival Spanish as well to see if he wants to try something like metoprolol or just d/c

## 2021-10-09 NOTE — Telephone Encounter (Signed)
Notified [facility] to stop Labetalol. Sara-Daughter notified. Make sure to call Sara-Daughter with change of medication. Notified [employer] notified. We will call her with replacement. Send new rx to [facility] per Wilder.

## 2021-10-14 NOTE — Progress Notes (Unsigned)
Cardiology Office Note   Date:  10/15/2021   ID:  Daniel Oconnell, DOB Oct 11, 1955, MRN [id number]  PCP:  Uvaldo Bristle, PA-C  Cardiologist:   Rollene Rotunda, MD    Chief Complaint  Patient presents with   Shortness of Breath      History of Present Illness: Daniel Oconnell is a 66 y.o. male who presents for follow-up of coronary disease. He had a history of bypass surgery in 2001 at [facility].   He did have a stroke in 2001 as well.  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 He had carotid stenting.     He did have COVID in August 2020.   He was admitted in March 2021 with COPD exacerbation.  I reviewed these records for this visit.  He lives at a nursing home.  He does walk although he has right hemiparesis.   At the last visit he had increased dyspnea and I added Lasix.   His EF was normal on echo.   BNP was elevated.  He has called with increased SOB and had his beta blocker discontinued.   He has been getting some increasing shortness of breath and is on 3 L of oxygen chronically.  He does have COPD.  He gets around in a wheelchair since his stroke.  He lives in a nursing home.  He has not been having any increased edema.  He sleeps chronically in a chair which he has done for years.  He is not having any palpitations, presyncope or syncope.  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     albuterol (PROVENTIL) (2.5 MG/3ML) 0.083% nebulizer solution Take 3 mLs (2.5 mg total) by nebulization every 4 (four) hours as needed for wheezing or shortness of breath. 75 mL 0   albuterol (VENTOLIN HFA) 108 (90 Base) MCG/ACT inhaler Inhale 1-2 puffs into the lungs every 6 (six) hours as needed for wheezing or shortness of breath. 8 g 6   alum & mag hydroxide-simeth (MAALOX/MYLANTA) 200-200-20 MG/5ML suspension Take 30 mLs by mouth as needed for indigestion or heartburn.     ASPIRIN 81 PO Take 81 mg by mouth daily.     atorvastatin (LIPITOR) 80 MG tablet Take 80 mg by mouth at bedtime.      baclofen (LIORESAL) 20 MG tablet Take 20 mg by mouth 3 (three) times daily.     clopidogrel (PLAVIX) 75 MG tablet Take 75 mg by mouth daily with breakfast.      dicyclomine (BENTYL) 10 MG capsule Take 10 mg by mouth daily.     docusate sodium (COLACE) 100 MG capsule Take 100 mg by  mouth 2 (two) times daily.     furosemide (LASIX) 40 MG tablet Take 1 tablet (40 mg total) by mouth daily. 90 tablet 3   Glycopyrrolate-Formoterol (BEVESPI AEROSPHERE) 9-4.8 MCG/ACT AERO Inhale 2 puffs into the lungs 2 (two) times daily. 10.7 g 6   guaifenesin (ROBITUSSIN) 100 MG/5ML syrup Take 200 mg by mouth every 6 (six) hours as needed for cough.     hydrALAZINE (APRESOLINE) 100 MG tablet Take 1 tablet (100 mg total) by mouth 3 (three) times daily. 270 tablet 3   levETIRAcetam (KEPPRA) 750 MG tablet Take 750 mg by mouth 2 (two) times daily.     levothyroxine (SYNTHROID, LEVOTHROID) 25 MCG tablet Take 1 tablet by mouth daily.     loperamide (IMODIUM) 2 MG capsule Take by mouth as needed for diarrhea or loose stools.     magnesium hydroxide (MILK OF MAGNESIA) 400 MG/5ML suspension Take 30 mLs by mouth at bedtime as needed for mild constipation.     melatonin 3 MG TABS tablet Take 3 mg by  mouth at bedtime.     mometasone (NASONEX) 50 MCG/ACT nasal spray SMARTSIG:Both Nares     neomycin-bacitracin-polymyxin (NEOSPORIN) ointment Apply 1 application. topically as needed for wound care.     Polyethyl Glycol-Propyl Glycol (SYSTANE ULTRA) 0.4-0.3 % SOLN Apply 1 drop to eye 3 (three) times daily.     polyethylene glycol powder (GLYCOLAX/MIRALAX) powder Take 17 g by mouth 2 (two) times daily. Use for constipation, as prescribed, until daily soft stools  OTC 119 g 0   potassium chloride SA (KLOR-CON M20) 20 MEQ tablet Take 1 tablet (20 mEq total) by mouth daily. 30 tablet 3   sertraline (ZOLOFT) 100 MG tablet Take 100 mg by mouth daily with breakfast.      tamsulosin (FLOMAX) 0.4 MG CAPS capsule Take 1 capsule (0.4 mg total) by mouth daily. 30 capsule 0   traZODone (DESYREL) 100 MG tablet Take 100 mg by mouth at bedtime.     vitamin B-12 (CYANOCOBALAMIN) 1000 MCG tablet Take 1,000 mcg by mouth daily.     potassium chloride 20 MEQ/15ML (10%) SOLN Take 30 mLs (40 mEq total) by mouth daily. 473 mL 11   torsemide (DEMADEX) 20 MG tablet Take 2 tablets (40 mg total) by mouth daily. 180 tablet 1   No current facility-administered medications for this visit.    Allergies:   Clonidine and Sulfa antibiotics    ROS:  Please see the history of present illness.   Otherwise, review of systems are positive for none.   All other systems are reviewed and negative.    PHYSICAL EXAM: VS:  BP 126/68 (BP Location: Left Arm, Patient Position: Sitting, Cuff Size: Normal)   Pulse 63   Ht 5\' 10"  (1.778 m)   Wt 180 lb (81.6 kg)   BMI 25.83 kg/m  , BMI Body mass index is 25.83 kg/m.  GEN:  No distress NECK:  No jugular venous distention at 90 degrees, waveform within normal limits, carotid upstroke brisk and symmetric, no bruits, no thyromegaly LYMPHATICS:  No cervical adenopathy LUNGS:  Clear to auscultation bilaterally BACK:  No CVA tenderness CHEST:  Unremarkable HEART:  S1 and S2 within normal  limits, no S3, no S4, no clicks, no rubs, no murmurs ABD:  Positive bowel sounds normal in frequency in pitch, no bruits, no rebound, no guarding, unable to assess midline mass or bruit with the patient seated. EXT:  2 plus pulses throughout, moderate edema, no cyanosis no clubbing  SKIN:  No rashes no nodules NEURO:  Cranial nerves II through XII grossly intact, motor grossly intact throughout PSYCH:  Cognitively intact, oriented to person place and time  throughout, no edema, no cyanosis no clubbing   EKG:  EKG is not ordered today.    Recent Labs: 07/24/2021: ALT 19; Hemoglobin 14.7; Platelets 204; TSH 9.160 08/07/2021: NT-Pro BNP 3,009 09/04/2021: BUN 10; Creatinine, Ser 1.00; Potassium 3.4; Sodium 144    Lipid Panel    Component Value Date/Time   CHOL 112 07/24/2021 1113   TRIG 73 07/24/2021 1113   HDL 38 (L) 07/24/2021 1113   CHOLHDL 2.9 07/24/2021 1113   LDLCALC 59 07/24/2021 1113      Wt Readings from Last 3 Encounters:  10/15/21 180 lb (81.6 kg)  08/14/21 181 lb 3.2 oz (82.2 kg)  07/24/21 181 lb 9.6 oz (82.4 kg)      Other studies Reviewed: Additional studies/ records that were reviewed today include: Labs Review of the above records demonstrates:  See elsewhere in the note.    ASSESSMENT AND PLAN:  CAD: The patient has no new sypmtoms.  No further cardiovascular testing is indicated.  We will continue with aggressive risk reduction and meds as listed.  At this point I am not strongly suspecting an ischemic etiology but I will have a low threshold for perfusion testing in the future if his symptoms persist.    HTN:  The blood pressure is at target.  No change in therapy.   CVA:   He has a history of carotid stenting.    SOB:   He did have an elevated BNP with some moderate left ventricular hypertrophy.  I am going to change his Lasix to Demadex 40 mg daily and increase his potassium.  I will check a basic metabolic profile.  I am going to check a PYP scan though  I do not have a strong suspicion for amyloid.  DYSLIPIDEMIA:   LDL recently was 59.  No change in therapy.   Current medicines are reviewed at length with the patient today.  The patient does not have concerns regarding medicines.  The following changes have been made: None  Labs/ tests ordered today include:  None  Orders Placed This Encounter  Procedures   Basic metabolic panel   MYOCARDIAL AMYLOID IMAGING PLANAR AND SPECT     Disposition:   FU with me in one month.    Signed, Rollene RotundaJames Georg Ang, MD  10/15/2021 12:48 PM    [facility]

## 2021-10-15 ENCOUNTER — Encounter: Payer: Self-pay | Admitting: Cardiology

## 2021-10-15 ENCOUNTER — Ambulatory Visit: Payer: Medicare HMO | Admitting: Cardiology

## 2021-10-15 VITALS — BP 126/68 | HR 63 | Ht 70.0 in | Wt 180.0 lb

## 2021-10-15 DIAGNOSIS — I10 Essential (primary) hypertension: Secondary | ICD-10-CM | POA: Diagnosis not present

## 2021-10-15 DIAGNOSIS — I2510 Atherosclerotic heart disease of native coronary artery without angina pectoris: Secondary | ICD-10-CM

## 2021-10-15 DIAGNOSIS — I5033 Acute on chronic diastolic (congestive) heart failure: Secondary | ICD-10-CM | POA: Diagnosis not present

## 2021-10-15 DIAGNOSIS — R0602 Shortness of breath: Secondary | ICD-10-CM

## 2021-10-15 DIAGNOSIS — E785 Hyperlipidemia, unspecified: Secondary | ICD-10-CM

## 2021-10-15 MED ORDER — TORSEMIDE 20 MG PO TABS: 40.0000 mg | ORAL_TABLET | Freq: Every day | ORAL | 1 refills | Status: DC

## 2021-10-15 MED ORDER — POTASSIUM CHLORIDE 20 MEQ/15ML (10%) PO SOLN
40.0000 meq | Freq: Every day | ORAL | 11 refills | Status: DC
Start: 2021-10-15 — End: 2021-10-27

## 2021-10-15 MED ORDER — POTASSIUM CHLORIDE 20 MEQ/15ML (10%) PO SOLN: 40.0000 meq | Freq: Every day | ORAL | 11 refills | Status: DC

## 2021-10-15 NOTE — Patient Instructions (Addendum)
Medication Instructions:  STOP Lasix START Demadex 40 mg daily  START Potassium (liquid form)   *If you need a refill on your cardiac medications before your next appointment, please call your pharmacy*   Lab Work: BMET today   If you have labs (blood work) drawn today and your tests are completely normal, you will receive your results only by: MyChart Message (if you have MyChart) OR A paper copy in the mail If you have any lab test that is abnormal or we need to change your treatment, we will call you to review the results.   Testing/Procedures: PYP SCAN    Follow-Up: At [facility], you and your health needs are our priority.  As part of our continuing mission to provide you with exceptional heart care, we have created designated Provider Care Teams.  These Care Teams include your primary Cardiologist (physician) and Advanced Practice Providers (APPs -  Physician Assistants and Nurse Practitioners) who all work together to provide you with the care you need, when you need it.  We recommend signing up for the patient portal called "MyChart".  Sign up information is provided on this After Visit Summary.  MyChart is used to connect with patients for Virtual Visits (Telemedicine).  Patients are able to view lab/test results, encounter notes, upcoming appointments, etc.  Non-urgent messages can be sent to your provider as well.   To learn more about what you can do with MyChart, go to [email or web address].    Your next appointment:   1 month(s)  The format for your next appointment:   In Person  Provider:   Rollene Rotunda, MD

## 2021-10-16 LAB — BASIC METABOLIC PANEL
BUN/Creatinine Ratio: 9 — ABNORMAL LOW (ref 10–24)
BUN: 9 mg/dL (ref 8–27)
CO2: 29 mmol/L (ref 20–29)
Calcium: 8.9 mg/dL (ref 8.6–10.2)
Chloride: 99 mmol/L (ref 96–106)
Creatinine, Ser: 0.98 mg/dL (ref 0.76–1.27)
Glucose: 81 mg/dL (ref 70–99)
Potassium: 4 mmol/L (ref 3.5–5.2)
Sodium: 140 mmol/L (ref 134–144)
eGFR: 84 mL/min/{1.73_m2} (ref >59)

## 2021-10-23 ENCOUNTER — Encounter (HOSPITAL_COMMUNITY): Payer: Self-pay

## 2021-10-23 ENCOUNTER — Ambulatory Visit (HOSPITAL_COMMUNITY)
Admission: EM | Admit: 2021-10-23 | Discharge: 2021-10-23 | Disposition: A | Discharge status: Home / Self Care | Payer: Medicare HMO

## 2021-10-23 ENCOUNTER — Telehealth: Payer: Self-pay | Admitting: Cardiology

## 2021-10-23 DIAGNOSIS — R109 Unspecified abdominal pain: Secondary | ICD-10-CM

## 2021-10-23 DIAGNOSIS — T503X5A Adverse effect of electrolytic, caloric and water-balance agents, initial encounter: Secondary | ICD-10-CM | POA: Diagnosis not present

## 2021-10-23 DIAGNOSIS — T50905A Adverse effect of unspecified drugs, medicaments and biological substances, initial encounter: Secondary | ICD-10-CM

## 2021-10-23 DIAGNOSIS — R197 Diarrhea, unspecified: Secondary | ICD-10-CM

## 2021-10-23 NOTE — Telephone Encounter (Signed)
Called pt's wife. She states they are about to go in to see the provider. She states his symptoms started Saturday (that is when he started the liquid potassium). I told her to call back with an update.

## 2021-10-23 NOTE — ED Triage Notes (Signed)
Pt c/o lower abdominal pain with diarrhea since Sunday after starting liquid potassium. Decrease intake.

## 2021-10-23 NOTE — Telephone Encounter (Signed)
Pt's wife states that pt has been having a upset stomach, stomach aches and diarrhea. Wife wanted to make Dr. Antoine Poche aware that she is taking pt to the Urgent Care. Please advise

## 2021-10-23 NOTE — Discharge Instructions (Addendum)
-  Follow-up with cardiology about potassium supplement -If symptoms get worse - head to the ED

## 2021-10-23 NOTE — ED Provider Notes (Signed)
[facility]    CSN: [id number] Arrival date & time: 10/23/21  1238      History   Chief Complaint Chief Complaint  Patient presents with   Abdominal Pain   Diarrhea    HPI Daniel Oconnell is a 66 y.o. male presenting with diarrhea for 5 days following starting liquid potassium as prescribed by cardiologist.  History of CVA, long-term anticoagulation, COPD on home oxygen.  Here today with family member who helps provide history.  They are concerned that the liquid potassium may be causing the diarrhea.  He did also recently eat an [employer] sandwich.  Approximately 8 episodes of watery diarrhea daily, never melena or hematochezia.  Denies nausea, vomiting.  Tolerating fluids but decreased appetite.  Cardiologist called him back while they were in the urgent care. No recent abx or travel.  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    Hyperlipidemia    Hypertension     Patient Active Problem List   Diagnosis Date Noted   SOB (shortness of breath) 08/13/2021   Community acquired bacterial pneumonia 08/13/2019   History of seizure 08/13/2019   OSA (obstructive sleep apnea) 08/13/2019   Cerebrovascular accident (CVA) (HCC) 02/24/2019   Dyslipidemia 02/23/2019   Leg swelling 03/10/2017   Medication management 03/10/2017   Hyperlipidemia 03/10/2017   Adrenal abnormality (HCC)    Expressive aphasia    Acute respiratory failure with hypoxia (HCC) 04/17/2016   Abdominal pain, acute, right lower quadrant 04/17/2016   Nonspecific abnormal electrocardiogram (ECG) (EKG) 04/17/2016   H/O: CVA (cerebrovascular accident) 04/17/2016   COPD with acute exacerbation (HCC) 04/17/2016   CAD  (coronary artery disease) 04/17/2016   Essential hypertension 04/17/2016   Right lower quadrant abdominal pain    HCAP (healthcare-associated pneumonia) 04/16/2016    Past Surgical History:  Procedure Laterality Date   APPENDECTOMY     CORONARY ARTERY BYPASS GRAFT  2001   LUMBAR SPINE SURGERY         Home Medications    Prior to Admission medications   Medication Sig Start Date End Date Taking? Authorizing Provider  acetaminophen (TYLENOL) 500 MG tablet Take 500 mg by mouth every 4 (four) hours as needed for mild pain, moderate pain, fever or headache.    [provider]  albuterol (PROVENTIL) (2.5 MG/3ML) 0.083% nebulizer solution Take 3 mLs (2.5 mg total) by nebulization every 4 (four) hours as needed for wheezing or shortness of breath. 04/22/16   Johnson, Clanford L, MD  albuterol (VENTOLIN HFA) 108 (90 Base) MCG/ACT inhaler Inhale 1-2 puffs into the lungs every 6 (six) hours as needed for wheezing or shortness of breath. 07/18/21   Martina Sinner, MD  alum & mag hydroxide-simeth (MAALOX/MYLANTA) 200-200-20 MG/5ML suspension Take 30 mLs by mouth as needed for indigestion or heartburn.    [provider]  ASPIRIN 81 PO Take 81 mg by mouth daily.    [provider]  atorvastatin (LIPITOR) 80 MG tablet Take 80 mg by mouth at bedtime.     [provider]  baclofen (LIORESAL) 20 MG tablet Take 20 mg by mouth 3 (three) times daily.    [provider]  clopidogrel (PLAVIX) 75 MG tablet Take 75 mg by mouth daily with breakfast.     [provider]  dicyclomine (BENTYL) 10 MG capsule Take 10 mg by mouth daily.    [provider]  docusate sodium (COLACE) 100 MG capsule Take 100 mg by mouth 2 (two) times daily.    [provider]  furosemide (LASIX) 40 MG tablet Take 1 tablet (40 mg total) by mouth daily. 08/14/21 11/12/21  Rollene RotundaHochrein, James, MD  Glycopyrrolate-Formoterol (BEVESPI AEROSPHERE) 9-4.8 MCG/ACT AERO Inhale 2  puffs into the lungs 2 (two) times daily. 07/18/21   Martina Sinnerewald, Jonathan B, MD  guaifenesin (ROBITUSSIN) 100 MG/5ML syrup Take 200 mg by mouth every 6 (six) hours as needed for cough.    [provider]  hydrALAZINE (APRESOLINE) 100 MG tablet Take 1 tablet (100 mg total) by mouth 3 (three) times daily. 08/14/21   Rollene RotundaHochrein, James, MD  levETIRAcetam (KEPPRA) 750 MG tablet Take 750 mg by mouth 2 (two) times daily.    [provider]  levothyroxine (SYNTHROID, LEVOTHROID) 25 MCG tablet Take 1 tablet by mouth daily. 03/25/18   [provider]  loperamide (IMODIUM) 2 MG capsule Take by mouth as needed for diarrhea or loose stools.    [provider]  magnesium hydroxide (MILK OF MAGNESIA) 400 MG/5ML suspension Take 30 mLs by mouth at bedtime as needed for mild constipation.    [provider]  melatonin 3 MG TABS tablet Take 3 mg by mouth at bedtime.    [provider]  mometasone (NASONEX) 50 MCG/ACT nasal spray SMARTSIG:Both Nares 08/04/21   [provider]  neomycin-bacitracin-polymyxin (NEOSPORIN) ointment Apply 1 application. topically as needed for wound care.    [provider]  Polyethyl Glycol-Propyl Glycol (SYSTANE ULTRA) 0.4-0.3 % SOLN Apply 1 drop to eye 3 (three) times daily.    [provider]  polyethylene glycol powder (GLYCOLAX/MIRALAX) powder Take 17 g by mouth 2 (two) times daily. Use for constipation, as prescribed, until daily soft stools  OTC 04/15/16   Antony MaduraHumes, Kelly, PA-C  potassium chloride 20 MEQ/15ML (10%) SOLN Take 30 mLs (40 mEq total) by mouth daily. 10/15/21   Rollene RotundaHochrein, James, MD  potassium chloride SA (KLOR-CON M20) 20 MEQ tablet Take 1 tablet (20 mEq total) by mouth daily. 09/05/21   Rollene RotundaHochrein, James, MD  sertraline (ZOLOFT) 100 MG tablet Take 100 mg by mouth daily with breakfast.     [provider]  tamsulosin (FLOMAX) 0.4 MG CAPS capsule Take 1 capsule (0.4 mg total) by mouth daily. 04/22/16    Johnson, Clanford L, MD  torsemide (DEMADEX) 20 MG tablet Take 2 tablets (40 mg total) by mouth daily. 10/15/21   Rollene RotundaHochrein, James, MD  traZODone (DESYREL) 100 MG tablet Take 100 mg by mouth at bedtime. 06/22/19   [provider]  vitamin B-12 (CYANOCOBALAMIN) 1000 MCG tablet Take 1,000 mcg by mouth daily.    [provider]    Family History Family History  Problem Relation Age of Onset   CVA Mother 1380   Aneurysm Father     Social History Social History   Tobacco Use   Smoking status: Former    Types: Cigarettes    Quit date: 2013    Years since quitting: 10.4   Smokeless tobacco: Never  Substance Use Topics   Alcohol use: No    Comment: h/o heavy ETOH use   Drug use: No    Types: Cocaine, Marijuana    Comment: h/o drug use  Allergies   Clonidine and Sulfa antibiotics   Review of Systems Review of Systems  Gastrointestinal:  Positive for abdominal pain and diarrhea.  All other systems reviewed and are negative.    Physical Exam Triage Vital Signs ED Triage Vitals  Enc Vitals Group     BP 10/23/21 1258 119/70     Pulse Rate 10/23/21 1258 (!) 102     Resp 10/23/21 1258 18     Temp 10/23/21 1258 97.9 F (36.6 C)     Temp Source 10/23/21 1258 Oral     SpO2 10/23/21 1258 93 %     Weight --      Height --      Head Circumference --      Peak Flow --      Pain Score 10/23/21 1259 0     Pain Loc --      Pain Edu? --      Excl. in GC? --    No data found.  Updated Vital Signs BP 119/70 (BP Location: Left Arm)   Pulse (!) 102   Temp 97.9 F (36.6 C) (Oral)   Resp 18   SpO2 93%   Visual Acuity Right Eye Distance:   Left Eye Distance:   Bilateral Distance:    Right Eye Near:   Left Eye Near:    Bilateral Near:     Physical Exam Vitals reviewed.  Constitutional:      General: He is not in acute distress.    Appearance: Normal appearance. He is not ill-appearing.  HENT:     Head: Normocephalic and atraumatic.      Mouth/Throat:     Mouth: Mucous membranes are moist.     Comments: Moist mucous membranes Eyes:     Extraocular Movements: Extraocular movements intact.     Pupils: Pupils are equal, round, and reactive to light.  Cardiovascular:     Rate and Rhythm: Normal rate and regular rhythm.     Heart sounds: Normal heart sounds.  Pulmonary:     Effort: Pulmonary effort is normal.     Breath sounds: Normal breath sounds. No wheezing, rhonchi or rales.  Abdominal:     General: Bowel sounds are increased. There is no distension.     Palpations: Abdomen is soft. There is no mass.     Tenderness: There is abdominal tenderness. There is no right CVA tenderness, left CVA tenderness, guarding or rebound.     Comments: Lower abd tenderness to palpation, without guarding, rebound, mass, hernia palpated. Comfortable throughout exam.   Skin:    General: Skin is warm.     Capillary Refill: Capillary refill takes less than 2 seconds.     Comments: Good skin turgor  Neurological:     General: No focal deficit present.     Mental Status: He is alert and oriented to person, place, and time.  Psychiatric:        Mood and Affect: Mood normal.        Behavior: Behavior normal.      UC Treatments / Results  Labs (all labs ordered are listed, but only abnormal results are displayed) Labs Reviewed - No data to display  EKG   Radiology No results found.  Procedures Procedures (including critical care time)  Medications Ordered in UC Medications - No data to display  Initial Impression / Assessment and Plan / UC Course  I have reviewed the triage vital signs and the nursing notes.  Pertinent labs &  imaging results that were available during my care of the patient were reviewed by me and considered in my medical decision making (see chart for details).     This patient is a very pleasant 66 y.o. year old male presenting with diarrhea following starting liquid potassium as prescribed by  cardiology. Appears well hydrated, he is having no n/v and minimal abd pain; tolerating fluids by mouth. F/u with cardiology for medication titration, or head to the ED if symptoms worsen. ED return precautions discussed. Patient verbalizes understanding and agreement.  .   Final Clinical Impressions(s) / UC Diagnoses   Final diagnoses:  Adverse effect of drug, initial encounter     Discharge Instructions      -Follow-up with cardiology about potassium supplement -If symptoms get worse - head to the ED   ED Prescriptions   None    PDMP not reviewed this encounter.   Rhys Martini, PA-C 10/23/21 1353

## 2021-10-23 NOTE — Telephone Encounter (Addendum)
Wife stated patient was started on liquid potassium this past Sunday. Since then, he experienced stomach pain, gas, diarrhea. She took patient to urgent care where doctor told her the patinet needs to go back on pill form K+. Patient cannot swallow pill. Asked wife to call nursing home to see if the liquid K+ is being diluted in water before administering. She will call our clinic back to inform us. Pharmacist K. Alvstad does not recommend crushing K+ tablets. At 3:49 pm, wife called to triage to inform clinic that nursing home was not diluting the K+ liquid. He is able to swallow liquids. I suggested the nursing home dilute the K+ in kool aid. She said she would let nursing home know and will also inform our clinic if patient's GI symptoms resolve or not.

## 2021-10-23 NOTE — Telephone Encounter (Signed)
Pt's POA was returning a call f

## 2021-10-24 ENCOUNTER — Ambulatory Visit: Payer: Medicare HMO | Admitting: General Practice

## 2021-10-27 ENCOUNTER — Other Ambulatory Visit: Payer: Self-pay

## 2021-10-27 MED ORDER — POTASSIUM CHLORIDE CRYS ER 10 MEQ PO TBCR: 20.0000 meq | EXTENDED_RELEASE_TABLET | Freq: Every day | ORAL | 3 refills | Status: DC

## 2021-10-27 NOTE — Telephone Encounter (Signed)
Spoke with pt wife, new script printed for the 10 meq potassium, 2 tablets daily. Script faxed to [facility] at wives request.

## 2021-10-27 NOTE — Telephone Encounter (Signed)
  Darl Pikes is calling back to follow up

## 2021-10-27 NOTE — Telephone Encounter (Signed)
The patient did not want to take the potassium over the weekend and did not, even when it was diluted. Stated the patient was feeling better and able to eat more like normal yesterday 6/11. She is asking that the medication be switched to pill form 10 meq yellow tablet so it will be easier for him to take. Will forward request to Dr. Percival Spanish.

## 2021-10-27 NOTE — Addendum Note (Signed)
Addended by: Freddi Starr on: 10/27/2021 10:07 AM   Modules accepted: Orders

## 2021-11-06 ENCOUNTER — Telehealth (HOSPITAL_COMMUNITY): Payer: Self-pay | Admitting: *Deleted

## 2021-11-06 ENCOUNTER — Telehealth: Payer: Self-pay | Admitting: Cardiology

## 2021-11-06 NOTE — Telephone Encounter (Signed)
Would like for nurse to return call regarding scheduled test that pt has tomorrow. Please advise

## 2021-11-06 NOTE — Telephone Encounter (Signed)
Wife Darl Pikes received call with PYP instructions and had further questions. Will it take about 4 hours, if so, is he allowed to eat 2. Will he need to lay down for 4 hours 3. Advised that questions would be sent to Susa Loffler who would be best to answer

## 2021-11-06 NOTE — Telephone Encounter (Signed)
Close encounter 

## 2021-11-06 NOTE — Telephone Encounter (Signed)
Pt c/o medication issue:  1. Name of Medication:   potassium chloride (KLOR-CON M) 10 MEQ tablet    2. How are you currently taking this medication (dosage and times per day)? As written  3. Are you having a reaction (difficulty breathing--STAT)? no  4. What is your medication issue? Jamie from [employer] stating that pt was prescribed a potassium pill but it was too big so it was changed to a liquid. She states that pt is now asking he can get a smaller potassium pill instead of liquid.   I listed the callback number but she said if she doesn't answer, you can ask for [facility].

## 2021-11-07 ENCOUNTER — Ambulatory Visit (HOSPITAL_COMMUNITY)
Admission: RE | Admit: 2021-11-07 | Discharge: 2021-11-07 | Disposition: A | Discharge status: Home / Self Care | Payer: Medicare HMO | Source: Ambulatory Visit | Attending: Internal Medicine | Admitting: Internal Medicine

## 2021-11-07 DIAGNOSIS — I5033 Acute on chronic diastolic (congestive) heart failure: Secondary | ICD-10-CM | POA: Diagnosis present

## 2021-11-07 MED ORDER — AMINOPHYLLINE 25 MG/ML IV SOLN: 75.0000 mg | Freq: Once | INTRAVENOUS | Status: DC

## 2021-11-07 MED ORDER — TECHNETIUM TC 99M PYROPHOSPHATE
21.2000 | Freq: Once | INTRAVENOUS | Status: AC
  Administered 2021-11-07: 21.2 via INTRAVENOUS

## 2021-11-11 ENCOUNTER — Ambulatory Visit: Payer: Medicare HMO | Admitting: Nurse Practitioner

## 2021-11-11 ENCOUNTER — Encounter: Payer: Self-pay | Admitting: Nurse Practitioner

## 2021-11-11 VITALS — BP 134/68 | HR 69 | Temp 97.7°F | Ht 70.0 in | Wt 181.6 lb

## 2021-11-11 DIAGNOSIS — J929 Pleural plaque without asbestos: Secondary | ICD-10-CM | POA: Diagnosis not present

## 2021-11-11 DIAGNOSIS — J439 Emphysema, unspecified: Secondary | ICD-10-CM

## 2021-11-11 DIAGNOSIS — R0609 Other forms of dyspnea: Secondary | ICD-10-CM

## 2021-11-11 DIAGNOSIS — J9611 Chronic respiratory failure with hypoxia: Secondary | ICD-10-CM | POA: Insufficient documentation

## 2021-11-11 DIAGNOSIS — J432 Centrilobular emphysema: Secondary | ICD-10-CM

## 2021-11-11 DIAGNOSIS — I5030 Unspecified diastolic (congestive) heart failure: Secondary | ICD-10-CM | POA: Insufficient documentation

## 2021-11-11 DIAGNOSIS — I5032 Chronic diastolic (congestive) heart failure: Secondary | ICD-10-CM

## 2021-11-11 NOTE — Assessment & Plan Note (Signed)
Mild on imaging. Benefits from LABA/LAMA with Bevespi but still having DOE. Not convinced this is related to his COPD/emphysema but we will check PFTs and may consider step up to triple therapy. In interim, continue Bevespi bid and prn albuterol. Referred to pulm rehab.

## 2021-11-11 NOTE — Assessment & Plan Note (Signed)
Echo from March unremarkable. Pro BNP significantly elevated. Changed from lasix to torsemide in May per cardiology. Follow up with Dr Antoine Poche as scheduled.

## 2021-11-13 NOTE — Progress Notes (Incomplete)
Cardiology Office Note   Date:  11/14/2021   ID:  Muhammadali, Ries 1956-01-09, MRN [id number]  PCP:  Uvaldo Bristle, PA-C  Cardiologist:   Rollene Rotunda, MD    No chief complaint on file.     History of Present Illness: Daniel Oconnell is a 66 y.o. male who presents for follow-up of coronary disease. He had a history of bypass surgery in 2001 at [facility].   He did have a stroke in 2001 as well.  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 He had carotid stenting.     He did have COVID in August 2020.   He was admitted in March 2021 with COPD exacerbation.  I reviewed these records for this visit.  He lives at a nursing home.  He does walk although he has right hemiparesis.   At the last visit he had increased dyspnea and I added Lasix.   His EF was normal on echo.   BNP was elevated.  He has called with increased SOB and had his beta blocker discontinued.   He was getting increasing SOB.   I changed him from Lasix to torsemide.   It seems like he was getting both on his med list.  His weight has been the same  The patient denies any new symptoms such as chest discomfort, neck or arm discomfort. There has been no new shortness of breath, PND or orthopnea. There have been no reported palpitations, presyncope or syncope.   His SOB is unchanged.     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     albuterol (PROVENTIL) (2.5 MG/3ML) 0.083% nebulizer solution Take 3 mLs (2.5 mg total) by nebulization every 4 (four) hours as needed for wheezing or shortness of breath. 75 mL 0   albuterol (VENTOLIN HFA) 108 (90 Base) MCG/ACT inhaler Inhale 1-2 puffs into the lungs every 6 (six) hours as needed for wheezing or shortness of breath. 8 g 6   alum & mag hydroxide-simeth (MAALOX/MYLANTA) 200-200-20 MG/5ML suspension Take 30 mLs by mouth as needed for indigestion or heartburn.     ASPIRIN 81 PO Take 81 mg by mouth daily.     atorvastatin (LIPITOR) 80 MG tablet Take 80 mg by mouth at bedtime.      baclofen (LIORESAL) 20 MG tablet Take 20 mg by mouth 3 (three) times daily.     clopidogrel (PLAVIX) 75 MG tablet Take 75 mg by mouth daily with breakfast.      dicyclomine (BENTYL) 10 MG capsule Take 10 mg by mouth daily.     docusate sodium (COLACE) 100 MG capsule Take 100 mg by mouth 2 (two) times daily.     furosemide (  LASIX) 40 MG tablet Take 1 tablet (40 mg total) by mouth daily. 90 tablet 3   Glycopyrrolate-Formoterol (BEVESPI AEROSPHERE) 9-4.8 MCG/ACT AERO Inhale 2 puffs into the lungs 2 (two) times daily. 10.7 g 6   guaifenesin (ROBITUSSIN) 100 MG/5ML syrup Take 200 mg by mouth every 6 (six) hours as needed for cough.     hydrALAZINE (APRESOLINE) 100 MG tablet Take 1 tablet (100 mg total) by mouth 3 (three) times daily. 270 tablet 3   levETIRAcetam (KEPPRA) 750 MG tablet Take 750 mg by mouth 2 (two) times daily.     levothyroxine (SYNTHROID, LEVOTHROID) 25 MCG tablet Take 1 tablet by mouth daily.     loperamide (IMODIUM) 2 MG capsule Take by mouth as needed for diarrhea or loose stools.     magnesium hydroxide (MILK OF MAGNESIA) 400 MG/5ML suspension Take 30 mLs by mouth at bedtime as needed for mild constipation.     melatonin 3 MG TABS tablet Take 3 mg by mouth at bedtime.     mometasone (NASONEX) 50  MCG/ACT nasal spray SMARTSIG:Both Nares     neomycin-bacitracin-polymyxin (NEOSPORIN) ointment Apply 1 application. topically as needed for wound care.     Polyethyl Glycol-Propyl Glycol (SYSTANE ULTRA) 0.4-0.3 % SOLN Apply 1 drop to eye 3 (three) times daily.     polyethylene glycol powder (GLYCOLAX/MIRALAX) powder Take 17 g by mouth 2 (two) times daily. Use for constipation, as prescribed, until daily soft stools  OTC 119 g 0   potassium chloride (KLOR-CON M) 10 MEQ tablet Take 2 tablets (20 mEq total) by mouth daily. 180 tablet 3   sertraline (ZOLOFT) 100 MG tablet Take 100 mg by mouth daily with breakfast.      tamsulosin (FLOMAX) 0.4 MG CAPS capsule Take 1 capsule (0.4 mg total) by mouth daily. 30 capsule 0   torsemide (DEMADEX) 20 MG tablet Take 2 tablets (40 mg total) by mouth daily. 180 tablet 1   traZODone (DESYREL) 100 MG tablet Take 100 mg by mouth at bedtime.     vitamin B-12 (CYANOCOBALAMIN) 1000 MCG tablet Take 1,000 mcg by mouth daily.     No current facility-administered medications for this visit.    Allergies:   Clonidine and Sulfa antibiotics    ROS:  Please see the history of present illness.   Otherwise, review of systems are positive for none.   All other systems are reviewed and negative.    PHYSICAL EXAM: VS:  BP 116/77   Pulse 71   Ht 5\' 10"  (1.778 m)   Wt 181 lb 3.2 oz (82.2 kg)   SpO2 92%   BMI 26.00 kg/m  , BMI Body mass index is 26 kg/m.  GEN:  No distress NECK:  No jugular venous distention at 90 degrees, waveform within normal limits, carotid upstroke brisk and symmetric, no bruits, no thyromegaly LYMPHATICS:  No cervical adenopathy LUNGS:  Clear to auscultation bilaterally BACK:  No CVA tenderness CHEST:  Unremarkable HEART:  S1 and S2 within normal limits, no S3, no S4, no clicks, no rubs, *** murmurs ABD:  Positive bowel sounds normal in frequency in pitch, no bruits, no rebound, no guarding, unable to assess midline mass or bruit with the  patient seated. EXT:  2 plus pulses throughout, no edema, no cyanosis no clubbing    EKG:  EKG is not ordered today. NA   Recent Labs: 07/24/2021: ALT 19; Hemoglobin 14.7; Platelets 204; TSH 9.160 08/07/2021: NT-Pro BNP 3,009 10/15/2021: BUN 9; Creatinine, Ser 0.98; Potassium  4.0; Sodium 140    Lipid Panel    Component Value Date/Time   CHOL 112 07/24/2021 1113   TRIG 73 07/24/2021 1113   HDL 38 (L) 07/24/2021 1113   CHOLHDL 2.9 07/24/2021 1113   LDLCALC 59 07/24/2021 1113      Wt Readings from Last 3 Encounters:  11/14/21 181 lb 3.2 oz (82.2 kg)  11/11/21 181 lb 9.6 oz (82.4 kg)  11/07/21 180 lb (81.6 kg)      Other studies Reviewed: Additional studies/ records that were reviewed today include: None Review of the above records demonstrates:  See elsewhere in the note.    ASSESSMENT AND PLAN:  CAD:   The patient has no new sypmtoms.  No further cardiovascular testing is indicated.  We will continue with aggressive risk reduction and meds as listed.  HTN:  The blood pressure is at target.  No change in therapy. CVA:   ***   He has a history of carotid stenting.    SOB:   ***  He did have an elevated BNP with some moderate left ventricular hypertrophy.  I am going to change his Lasix to Demadex 40 mg daily and increase his potassium.  I will check a basic metabolic profile.  I am going to check a PYP scan though I do not have a strong suspicion for amyloid.  DYSLIPIDEMIA:   LDL was *** recently was 59.  No change in therapy.   Current medicines are reviewed at length with the patient today.  The patient does not have concerns regarding medicines.  The following changes have been made: ***  Labs/ tests ordered today include:  ***  No orders of the defined types were placed in this encounter.    Disposition:   FU with me in ***.    Signed, Rollene Rotunda, MD  11/14/2021 1:05 PM    [facility]

## 2021-11-14 ENCOUNTER — Encounter: Payer: Self-pay | Admitting: Cardiology

## 2021-11-14 ENCOUNTER — Ambulatory Visit: Payer: Medicare HMO | Admitting: Cardiology

## 2021-11-14 VITALS — BP 116/77 | HR 71 | Ht 70.0 in | Wt 181.2 lb

## 2021-11-14 DIAGNOSIS — R0602 Shortness of breath: Secondary | ICD-10-CM | POA: Diagnosis not present

## 2021-11-14 DIAGNOSIS — I639 Cerebral infarction, unspecified: Secondary | ICD-10-CM

## 2021-11-14 DIAGNOSIS — E785 Hyperlipidemia, unspecified: Secondary | ICD-10-CM | POA: Diagnosis not present

## 2021-11-14 DIAGNOSIS — I2510 Atherosclerotic heart disease of native coronary artery without angina pectoris: Secondary | ICD-10-CM

## 2021-11-14 NOTE — Patient Instructions (Signed)
Medication Instructions:  Your physician recommends that you continue on your current medications as directed. Please refer to the Current Medication list given to you today.  *If you need a refill on your cardiac medications before your next appointment, please call your pharmacy*   Lab Work: Your physician recommends that you return for lab work in:  BMET If you have labs (blood work) drawn today and your tests are completely normal, you will receive your results only by: MyChart Message (if you have MyChart) OR A paper copy in the mail If you have any lab test that is abnormal or we need to change your treatment, we will call you to review the results.   Testing/Procedures: None   Follow-Up: At [facility], you and your health needs are our priority.  As part of our continuing mission to provide you with exceptional heart care, we have created designated Provider Care Teams.  These Care Teams include your primary Cardiologist (physician) and Advanced Practice Providers (APPs -  Physician Assistants and Nurse Practitioners) who all work together to provide you with the care you need, when you need it.  We recommend signing up for the patient portal called "MyChart".  Sign up information is provided on this After Visit Summary.  MyChart is used to connect with patients for Virtual Visits (Telemedicine).  Patients are able to view lab/test results, encounter notes, upcoming appointments, etc.  Non-urgent messages can be sent to your provider as well.   To learn more about what you can do with MyChart, go to [email or web address].    Your next appointment:   4 month(s)  The format for your next appointment:   In Person  Provider:   An APP   Other Instructions   Important Information About Sugar

## 2021-11-16 ENCOUNTER — Encounter: Payer: Self-pay | Admitting: Cardiology

## 2021-11-16 NOTE — Progress Notes (Signed)
Cardiology Office Note   Date:  11/16/2021   ID:  Daniel Oconnell, DOB [birth year], MRN [id number]  PCP:  Daniel Bristle, PA-C  Cardiologist:   None   Chief Complaint  Patient presents with   Shortness of Breath      History of Present Illness: Daniel Oconnell is a 67 y.o. male who presents who presents for follow-up of coronary disease. He had a history of bypass surgery in 2001 at [place].   He did have a stroke in 2001 as well.  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 He had carotid stenting.      He did have COVID in August 2020.   He was admitted in March 2021 with COPD exacerbation.  I reviewed these records for this visit.  He lives at a nursing home.  He does walk although he has right hemiparesis.   At the last visit he had increased dyspnea and I added Lasix.   His EF was normal on echo.   BNP was elevated.  PYP did not suggest amyloid   He has been getting some increasing shortness of breath and is on 3 L of oxygen chronically.  He does have COPD.  He gets around in a wheelchair since his stroke.  He lives in a nursing home.  At the last visit because of his dyspnea I changed him from Lasix to Torsemide.  It seems like he has been getting both secondary to the nursing home not removing the lasix.  Despite this he has not had any improvement in his dyspnea.  He is still requiring 3 liters most of the time.  He denies any fevers, cough.  No PND or orthopnea.    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     albuterol (PROVENTIL) (2.5 MG/3ML) 0.083% nebulizer solution Take 3 mLs (2.5 mg total) by nebulization every 4 (four) hours as needed for wheezing or shortness of breath. 75 mL 0   albuterol (VENTOLIN HFA) 108 (90 Base) MCG/ACT inhaler Inhale 1-2 puffs into the lungs every 6 (six) hours as needed for wheezing or shortness of breath. 8 g 6   alum & mag hydroxide-simeth (MAALOX/MYLANTA) 200-200-20 MG/5ML suspension Take 30 mLs by mouth as needed for indigestion or heartburn.     ASPIRIN 81 PO Take 81 mg by mouth daily.     atorvastatin (LIPITOR) 80 MG tablet Take 80 mg by mouth at bedtime.      baclofen (LIORESAL) 20 MG tablet Take 20 mg by mouth 3 (three) times daily.     clopidogrel (PLAVIX) 75 MG tablet Take 75 mg by mouth daily with breakfast.      dicyclomine (BENTYL) 10 MG  capsule Take 10 mg by mouth daily.     docusate sodium (COLACE) 100 MG capsule Take 100 mg by mouth 2 (two) times daily.     furosemide (LASIX) 40 MG tablet Take 1 tablet (40 mg total) by mouth daily. 90 tablet 3   Glycopyrrolate-Formoterol (BEVESPI AEROSPHERE) 9-4.8 MCG/ACT AERO Inhale 2 puffs into the lungs 2 (two) times daily. 10.7 g 6   guaifenesin (ROBITUSSIN) 100 MG/5ML syrup Take 200 mg by mouth every 6 (six) hours as needed for cough.     hydrALAZINE (APRESOLINE) 100 MG tablet Take 1 tablet (100 mg total) by mouth 3 (three) times daily. 270 tablet 3   levETIRAcetam (KEPPRA) 750 MG tablet Take 750 mg by mouth 2 (two) times daily.     levothyroxine (SYNTHROID, LEVOTHROID) 25 MCG tablet Take 1 tablet by mouth daily.     loperamide (IMODIUM) 2 MG capsule Take by mouth as needed for diarrhea or loose stools.     magnesium hydroxide (MILK OF MAGNESIA) 400 MG/5ML suspension Take 30 mLs by mouth at  bedtime as needed for mild constipation.     melatonin 3 MG TABS tablet Take 3 mg by mouth at bedtime.     mometasone (NASONEX) 50 MCG/ACT nasal spray SMARTSIG:Both Nares     neomycin-bacitracin-polymyxin (NEOSPORIN) ointment Apply 1 application. topically as needed for wound care.     Polyethyl Glycol-Propyl Glycol (SYSTANE ULTRA) 0.4-0.3 % SOLN Apply 1 drop to eye 3 (three) times daily.     polyethylene glycol powder (GLYCOLAX/MIRALAX) powder Take 17 g by mouth 2 (two) times daily. Use for constipation, as prescribed, until daily soft stools  OTC 119 g 0   potassium chloride (KLOR-CON M) 10 MEQ tablet Take 2 tablets (20 mEq total) by mouth daily. 180 tablet 3   sertraline (ZOLOFT) 100 MG tablet Take 100 mg by mouth daily with breakfast.      tamsulosin (FLOMAX) 0.4 MG CAPS capsule Take 1 capsule (0.4 mg total) by mouth daily. 30 capsule 0   torsemide (DEMADEX) 20 MG tablet Take 2 tablets (40 mg total) by mouth daily. 180 tablet 1   traZODone (DESYREL) 100 MG tablet Take 100 mg by mouth at bedtime.     vitamin B-12 (CYANOCOBALAMIN) 1000 MCG tablet Take 1,000 mcg by mouth daily.     No current facility-administered medications for this visit.    Allergies:   Clonidine and Sulfa antibiotics    ROS:  Please see the history of present illness.   Otherwise, review of systems are positive for none.   All other systems are reviewed and negative.    PHYSICAL EXAM: VS:  BP 116/77   Pulse 71   Ht 5\' 10"  (1.778 m)   Wt 181 lb 3.2 oz (82.2 kg)   SpO2 92%   BMI 26.00 kg/m  , BMI Body mass index is 26 kg/m. GEN:  No distress NECK:  No jugular venous distention at 90 degrees, waveform within normal limits, carotid upstroke brisk and symmetric, no bruits, no thyromegaly LUNGS:  Clear to auscultation bilaterally BACK:  No CVA tenderness CHEST:  Unremarkable HEART:  S1 and S2 within normal limits, no S3, no S4, no clicks, no rubs, no murmurs ABD:  Positive bowel sounds normal in frequency in  pitch, no bruits, no rebound, no guarding, unable to assess midline mass or bruit with the patient seated. EXT:  2 plus pulses throughout, no edema, no cyanosis no clubbing  EKG:  EKG is not ordered today.  Recent Labs: 07/24/2021: ALT 19; Hemoglobin 14.7; Platelets 204; TSH 9.160 08/07/2021: NT-Pro BNP 3,009 10/15/2021: BUN 9; Creatinine, Ser 0.98; Potassium 4.0; Sodium 140    Lipid Panel    Component Value Date/Time   CHOL 112 07/24/2021 1113   TRIG 73 07/24/2021 1113   HDL 38 (L) 07/24/2021 1113   CHOLHDL 2.9 07/24/2021 1113   LDLCALC 59 07/24/2021 1113      Wt Readings from Last 3 Encounters:  11/14/21 181 lb 3.2 oz (82.2 kg)  11/11/21 181 lb 9.6 oz (82.4 kg)  11/07/21 180 lb (81.6 kg)      Other studies Reviewed Additional studies/ records that were reviewed today include: None. Review of the above records demonstrates:  Please see elsewhere in the note.     ASSESSMENT AND PLAN:  CAD:  The patient has no new sypmtoms.  No further cardiovascular testing is indicated.  We will continue with aggressive risk reduction and meds as listed.  HTN:  The blood pressure is at target.  No change in therapy.     CVA:   He has a history of carotid stenting.   Continue with risk reduction.    SOB:   He has had no improvement despite increased diuretic as above.  He does have PFTs pending.  No further work up.  I will make sure his Lasix is stopped.  Continue Torsemide and check a BMET.     DYSLIPIDEMIA:   LDL recently was 59.  No change in therapy.      Current medicines are reviewed at length with the patient today.  The patient does not have concerns regarding medicines.  The following changes have been made:  no change  Labs/ tests ordered today include: None  Orders Placed This Encounter  Procedures   Basic Metabolic Panel (BMET)     Disposition:   FU with me in six months.     Signed, Rollene Rotunda, MD  11/16/2021 5:30 PM    [facility]  [facility]

## 2021-11-17 ENCOUNTER — Encounter (HOSPITAL_COMMUNITY): Payer: Self-pay

## 2021-11-20 LAB — BASIC METABOLIC PANEL
BUN/Creatinine Ratio: 13 (ref 10–24)
BUN: 13 mg/dL (ref 8–27)
CO2: 29 mmol/L (ref 20–29)
Calcium: 9.2 mg/dL (ref 8.6–10.2)
Chloride: 96 mmol/L (ref 96–106)
Creatinine, Ser: 1.03 mg/dL (ref 0.76–1.27)
Glucose: 98 mg/dL (ref 70–99)
Potassium: 3.9 mmol/L (ref 3.5–5.2)
Sodium: 140 mmol/L (ref 134–144)
eGFR: 79 mL/min/{1.73_m2} (ref >59)

## 2021-11-24 ENCOUNTER — Encounter: Payer: Self-pay | Admitting: *Deleted

## 2021-11-28 ENCOUNTER — Ambulatory Visit: Payer: Medicare HMO | Admitting: Pulmonary Disease

## 2021-12-01 ENCOUNTER — Other Ambulatory Visit (HOSPITAL_COMMUNITY): Payer: Medicare HMO

## 2021-12-03 ENCOUNTER — Telehealth (HOSPITAL_COMMUNITY): Payer: Self-pay | Admitting: *Deleted

## 2021-12-03 NOTE — Telephone Encounter (Signed)
Received pulmonary rehab referral.  Called and left message regarding ability to participate in a group exercise program. Will also send please contact letter. Alanson Aly, BSN Cardiac and Emergency planning/management officer

## 2021-12-04 ENCOUNTER — Other Ambulatory Visit: Payer: Self-pay

## 2021-12-04 ENCOUNTER — Inpatient Hospital Stay (HOSPITAL_COMMUNITY): Admission: RE | Admit: 2021-12-04 | Payer: Medicare HMO | Source: Ambulatory Visit

## 2021-12-04 DIAGNOSIS — J9611 Chronic respiratory failure with hypoxia: Secondary | ICD-10-CM

## 2021-12-04 DIAGNOSIS — J439 Emphysema, unspecified: Secondary | ICD-10-CM

## 2021-12-04 NOTE — Progress Notes (Signed)
CT

## 2021-12-05 ENCOUNTER — Telehealth: Payer: Self-pay | Admitting: Nurse Practitioner

## 2021-12-05 ENCOUNTER — Telehealth (HOSPITAL_COMMUNITY): Payer: Self-pay | Admitting: *Deleted

## 2021-12-05 NOTE — Telephone Encounter (Signed)
I signed this yesterday and gave it back to CMA.

## 2021-12-05 NOTE — Telephone Encounter (Signed)
[facility] states that they need Daniel Oconnell to sign the order for the CT scan that is to be done on 7/27  Please advise

## 2021-12-05 NOTE — Telephone Encounter (Signed)
I received the fax confirmation and placed in K. [facility] cabinet. Nothing further needed.

## 2021-12-05 NOTE — Telephone Encounter (Signed)
Pt wife returned call and left message x 2 on departmental voicemail cardiac and pulmonary rehab.  Called and left message for Daniel Oconnell. Alanson Aly, BSN Cardiac and Emergency planning/management officer

## 2021-12-05 NOTE — Telephone Encounter (Signed)
I called [facility] and verified that a CT order was still needed and it was. I have faxed the order and I am waiting on confirmation.

## 2021-12-05 NOTE — Telephone Encounter (Signed)
Noted. Nothing further needed. 

## 2021-12-10 ENCOUNTER — Telehealth (HOSPITAL_COMMUNITY): Payer: Self-pay | Admitting: *Deleted

## 2021-12-10 NOTE — Telephone Encounter (Signed)
Pt ex wife Romana Juniper who is listed on dpr returned call.  Spoke extensively with her regarding pulmonary rehab expectations and referral process. Pt resides in [place] in an assistive living center. Finished receiving PT and OT.  Only uses wheelchair for walking long distances.  Able to move about independently and care for himself. Looking forward to participating in pulmonary rehab. Alanson Aly, BSN Cardiac and Emergency planning/management officer

## 2021-12-12 ENCOUNTER — Telehealth: Payer: Self-pay | Admitting: Nurse Practitioner

## 2021-12-12 NOTE — Telephone Encounter (Signed)
Will request images for Dr Francine Graven. Called patient to notify him of this request. Told patient we will review after PFT. Nothing further needed

## 2021-12-12 NOTE — Telephone Encounter (Signed)
I don't have the images but I can see the read. Can you make sure Dr. Francine Graven gets copies of the imaging to review? He has evidence of asbestos related lung disease, emphysema and pulmonary edema. All of which are contributing to his DOE. We can discuss more after he has pulmonary function testing and I see him next. Thanks!

## 2021-12-12 NOTE — Telephone Encounter (Signed)
Called patient and the daughter states that her dad got CT scan done yesterday at [facility].   Florentina Addison can you look to see if you can see the images if not I will call and request images for you  Thank you

## 2021-12-17 ENCOUNTER — Telehealth (HOSPITAL_COMMUNITY): Payer: Self-pay

## 2021-12-17 ENCOUNTER — Encounter (HOSPITAL_COMMUNITY): Payer: Self-pay

## 2021-12-17 NOTE — Telephone Encounter (Signed)
Pt wife called back and stated that pt is interested in the pulmonary rehab program. Pt will come in for orientation on 12/22/2021@10 :30am and will attend the 1:15pm exercise time.

## 2021-12-17 NOTE — Telephone Encounter (Signed)
Pt insurance is active and benefits verified through [employer] $10, DED 0/0 met, out of pocket $3,400/$2,605.46 met, co-insurance 0%. no pre-authorization required. [place]/[employer] 12/17/2021_0 Terrence Dupont, REF# [id number]

## 2021-12-18 ENCOUNTER — Telehealth (HOSPITAL_COMMUNITY): Payer: Self-pay

## 2021-12-18 NOTE — Telephone Encounter (Signed)
Called to confirm appt. Pt's family member confirmed appt. Gave instructions on proper footwear along with department number. Pt's family member voiced understanding.

## 2021-12-22 ENCOUNTER — Encounter (HOSPITAL_COMMUNITY): Payer: Self-pay

## 2021-12-22 ENCOUNTER — Encounter (HOSPITAL_COMMUNITY)
Admission: RE | Admit: 2021-12-22 | Discharge: 2021-12-22 | Disposition: A | Discharge status: Home / Self Care | Payer: Medicare HMO | Source: Ambulatory Visit | Attending: Pulmonary Disease | Admitting: Pulmonary Disease

## 2021-12-22 VITALS — BP 122/66 | HR 69 | Ht 70.0 in | Wt 185.4 lb

## 2021-12-22 DIAGNOSIS — J9611 Chronic respiratory failure with hypoxia: Secondary | ICD-10-CM | POA: Diagnosis present

## 2021-12-22 NOTE — Progress Notes (Signed)
Pulmonary Rehab Orientation Physical Assessment Note  Physical assessment reveals  Pt who is aphasic is accompanied by his ex wife, Darl Pikes is alert and oriented x 3. Pt able to communicate some with staff and relies on hand gestures as well as facial expression  Heart rate is normal, breath sounds diminished throughout.  Reports non-productive cough. Bowel sounds present.  Pt denies/endorses abdominal discomfort, nausea, vomiting or diarrhea. Grip strength strong on left weak on right although stronger than anticipated. Distal pulses palpable; no  swelling to lower extremities. Alanson Aly, BSN Cardiac and Emergency planning/management officer

## 2021-12-22 NOTE — Progress Notes (Signed)
Pulmonary Individual Treatment Plan  Patient Details  Name: Daniel Oconnell MRN: [id number] Date of Birth: [birth year] Referring Provider:   Doristine DevoidFlowsheet Row Pulmonary Rehab Walk [facility]  Referring Provider [name]       Initial Encounter Date:  Flowsheet Row Pulmonary Rehab Walk [facility]  Date 12/22/21       Visit Diagnosis: Chronic respiratory failure with hypoxia (HCC)  Patient's Home Medications on Admission:   Current Outpatient Medications:    acetaminophen (TYLENOL) 500 MG tablet, Take 500 mg by mouth every 4 (four) hours as needed for mild pain, moderate pain, fever or headache., Disp: , Rfl:    albuterol (PROVENTIL) (2.5 MG/3ML) 0.083% nebulizer solution, Take 3 mLs (2.5 mg total) by nebulization every 4 (four) hours as needed for wheezing or shortness of breath., Disp: 75 mL, Rfl: 0   albuterol (VENTOLIN HFA) 108 (90 Base) MCG/ACT inhaler, Inhale 1-2 puffs into the lungs every 6 (six) hours as needed for wheezing or shortness of breath., Disp: 8 g, Rfl: 6   alum & mag hydroxide-simeth (MAALOX/MYLANTA) 200-200-20 MG/5ML suspension, Take 30 mLs by mouth as needed for indigestion or heartburn., Disp: , Rfl:    ASPIRIN 81 PO, Take 81 mg by mouth daily., Disp: , Rfl:    atorvastatin (LIPITOR) 80 MG tablet, Take 80 mg by mouth at bedtime. , Disp: , Rfl:    baclofen (LIORESAL) 20 MG tablet, Take 20 mg by mouth 3 (three) times daily., Disp: , Rfl:    clopidogrel (PLAVIX) 75 MG tablet, Take 75 mg by mouth daily with breakfast. , Disp: , Rfl:    dicyclomine (BENTYL) 10 MG capsule, Take 10 mg by mouth daily., Disp: , Rfl:    docusate sodium (COLACE) 100 MG capsule, Take 100 mg by mouth 2 (two) times daily., Disp: , Rfl:    Glycopyrrolate-Formoterol (BEVESPI AEROSPHERE) 9-4.8 MCG/ACT AERO, Inhale 2 puffs into the lungs 2 (two) times daily., Disp: 10.7 g, Rfl: 6   guaifenesin  (ROBITUSSIN) 100 MG/5ML syrup, Take 200 mg by mouth every 6 (six) hours as needed for cough., Disp: , Rfl:    hydrALAZINE (APRESOLINE) 100 MG tablet, Take 1 tablet (100 mg total) by mouth 3 (three) times daily., Disp: 270 tablet, Rfl: 3   levETIRAcetam (KEPPRA) 750 MG tablet, Take 750 mg by mouth 2 (two) times daily., Disp: , Rfl:    levothyroxine (SYNTHROID, LEVOTHROID) 25 MCG tablet, Take 1 tablet by mouth daily., Disp: , Rfl:    loperamide (IMODIUM) 2 MG capsule, Take by mouth as needed for diarrhea or loose stools., Disp: , Rfl:    magnesium hydroxide (MILK OF MAGNESIA) 400 MG/5ML suspension, Take 30 mLs by mouth at bedtime as needed for mild constipation., Disp: , Rfl:    melatonin 3 MG TABS tablet, Take 3 mg by mouth at bedtime., Disp: , Rfl:    mometasone (NASONEX) 50 MCG/ACT nasal spray, SMARTSIG:Both Nares, Disp: , Rfl:    neomycin-bacitracin-polymyxin (NEOSPORIN) ointment, Apply 1 application. topically as needed for wound care., Disp: , Rfl:    Polyethyl Glycol-Propyl Glycol (SYSTANE ULTRA) 0.4-0.3 % SOLN, Apply 1 drop to eye 3 (three) times daily., Disp: , Rfl:    polyethylene glycol powder (GLYCOLAX/MIRALAX) powder, Take 17 g by mouth 2 (two) times daily. Use for constipation, as prescribed, until daily soft stools  OTC, Disp: 119 g, Rfl: 0   potassium chloride (KLOR-CON M) 10 MEQ tablet, Take  2 tablets (20 mEq total) by mouth daily., Disp: 180 tablet, Rfl: 3   sertraline (ZOLOFT) 100 MG tablet, Take 100 mg by mouth daily with breakfast. 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    Hyperlipidemia    Hypertension     Tobacco Use: Social History   Tobacco Use  Smoking Status Former   Packs/day: 1.00   Years: 28.00   Total pack years: 28.00   Types: Cigarettes   Quit date: 2013   Years since quitting: 10.6  Smokeless Tobacco Never    Labs: Review Flowsheet       Latest Ref Rng & Units 04/16/2016 03/10/2017 03/05/2020 07/24/2021  Labs for ITP Cardiac and Pulmonary Rehab  Cholestrol 100 - 199 mg/dL - 993  570  177   LDL (calc) 0 - 99 mg/dL - 47  52  59   HDL-C >93 mg/dL - 35  35  38   Trlycerides 0 - 149 mg/dL - 903  009  73   PH, Arterial 7.350 - 7.450 7.369  - - -  PCO2 arterial 32.0 - 48.0 mmHg 42.7  - - -  Bicarbonate 20.0 - 28.0 mmol/L 24.0  - - -  Acid-base deficit 0.0 - 2.0 mmol/L 0.9  - - -  O2 Saturation % 88.4  - - -    Capillary Blood Glucose: Lab Results  Component Value Date   GLUCAP 90 02/04/2019     Pulmonary Assessment Scores:  Pulmonary Assessment Scores     Row Name 12/22/21 1109         ADL UCSD   ADL Phase Entry     SOB Score total 106       CAT Score   CAT Score 30       mMRC Score   mMRC Score 4             UCSD: Self-administered rating of dyspnea associated with activities of daily living (ADLs) 6-point scale (0 = "not at all" to 5 = "maximal or unable to do because of breathlessness")  Scoring Scores range from 0 to 120.  Minimally important difference is 5 units  CAT: CAT can identify the health impairment of COPD patients and is better correlated with disease progression.  CAT  has a scoring range of zero to 40. The CAT score is classified into four groups of low (less than 10), medium (10 - 20), high (21-30) and very high (31-40) based on the impact level of disease on health status. A CAT score over  10 suggests significant symptoms.  A worsening CAT score could be explained by an exacerbation, poor medication adherence, poor inhaler technique, or progression of COPD or comorbid conditions.  CAT MCID is 2 points  mMRC: mMRC (Modified Medical Research Council) Dyspnea Scale is used to assess the degree of baseline functional disability in patients of respiratory disease due to dyspnea. No minimal important difference is established. A decrease in score of 1 point or greater is considered a positive change.   Pulmonary Function Assessment:  Pulmonary Function Assessment - 12/22/21 1137       Breath   Bilateral Breath Sounds Clear;Decreased    Shortness of Breath Yes;Fear of Shortness of Breath;Limiting activity;Panic with Shortness of Breath             Exercise Target Goals: Exercise Program Goal: Individual exercise prescription set using results from initial 6 min walk test and THRR while considering  patient's activity barriers and safety.   Exercise Prescription Goal: Initial exercise prescription builds to 30-45 minutes a day of aerobic activity, 2-3 days per week.  Home exercise guidelines will be given to patient during program as part of exercise prescription that the participant will acknowledge.  Activity Barriers & Risk Stratification:  Activity Barriers & Cardiac Risk Stratification - 12/22/21 1126       Activity Barriers & Cardiac Risk Stratification   Activity Barriers Arthritis;Back Problems;Neck/Spine Problems;History of Falls;Muscular Weakness;Deconditioning;Shortness of Breath   CVA in 2001, 2013            6 Minute Walk:  6 Minute Walk     Row Name 12/22/21 1238         6 Minute Walk   Phase Initial     Distance 180 feet      Walk Time 6 minutes     # of Rest Breaks 3  0:34-1:132, 2:39-5:00, 5:30-6:00     MPH 0.34     METS 1.18     RPE 19     Perceived Dyspnea  4     VO2 Peak 4.12     Symptoms No     Resting HR 69 bpm     Resting BP 122/66     Resting Oxygen Saturation  90 %     Exercise Oxygen Saturation  during 6 min walk 91 %     Max Ex. HR 85 bpm     Max Ex. BP 156/78     2 Minute Post BP 130/76       Interval HR   1 Minute HR 76     2 Minute HR 78     3 Minute HR 73     4 Minute HR 62     5 Minute HR 73     6 Minute HR 85     2 Minute Post HR 62     Interval Heart Rate? Yes       Interval Oxygen   Interval Oxygen? Yes     Baseline Oxygen Saturation % 90 %     1 Minute Oxygen Saturation % 94 %     1 Minute Liters of Oxygen 3 L     2 Minute Oxygen Saturation % 92 %     2 Minute Liters of Oxygen 3 L     3 Minute Oxygen Saturation % 93 %     3 Minute Liters of Oxygen 3 L     4 Minute Oxygen Saturation % 95 %     4  Minute Liters of Oxygen 3 L     5 Minute Oxygen Saturation % 97 %     5 Minute Liters of Oxygen 3 L     6 Minute Oxygen Saturation % 91 %     6 Minute Liters of Oxygen 3 L     2 Minute Post Oxygen Saturation % 96 %     2 Minute Post Liters of Oxygen 3 L              Oxygen Initial Assessment:  Oxygen Initial Assessment - 12/22/21 1122       Home Oxygen   Home Oxygen Device Portable Concentrator;Home Concentrator    Sleep Oxygen Prescription Continuous    Liters per minute 3    Home Exercise Oxygen Prescription Pulsed    Liters per minute 4    Home Resting Oxygen Prescription Continuous    Liters per minute 3    Compliance with Home Oxygen Use Yes      Initial 6 min Walk   Oxygen Used Continuous    Liters per minute 3      Program Oxygen Prescription   Program Oxygen Prescription Continuous    Liters per minute 3      Intervention   Short Term Goals To learn and exhibit compliance with exercise, home and travel O2 prescription;To learn and understand  importance of monitoring SPO2 with pulse oximeter and demonstrate accurate use of the pulse oximeter.;To learn and understand importance of maintaining oxygen saturations>88%;To learn and demonstrate proper pursed lip breathing techniques or other breathing techniques. ;To learn and demonstrate proper use of respiratory medications    Long  Term Goals Exhibits compliance with exercise, home  and travel O2 prescription;Verbalizes importance of monitoring SPO2 with pulse oximeter and return demonstration;Maintenance of O2 saturations>88%;Exhibits proper breathing techniques, such as pursed lip breathing or other method taught during program session;Compliance with respiratory medication;Demonstrates proper use of MDI's             Oxygen Re-Evaluation:   Oxygen Discharge (Final Oxygen Re-Evaluation):   Initial Exercise Prescription:  Initial Exercise Prescription - 12/22/21 1200       Date of Initial Exercise RX and Referring Provider   Date 12/22/21    Referring Provider [name]    Expected Discharge Date 02/26/22      Oxygen   Oxygen Continuous    Liters 3    Maintain Oxygen Saturation 88% or higher      Recumbant Bike   Level 1    RPM 20    Minutes 25      Prescription Details   Frequency (times per week) 2    Duration Progress to 30 minutes of continuous aerobic without signs/symptoms of physical distress      Intensity   THRR 40-80% of Max Heartrate 61-122    Ratings of Perceived Exertion 11-13    Perceived Dyspnea 0-4      Progression   Progression Continue to progress workloads to maintain intensity without signs/symptoms of physical distress.      Resistance Training   Training Prescription Yes    Weight yellow bands    Reps 10-15             Perform Capillary Blood Glucose checks as needed.  Exercise Prescription Changes:   Exercise Comments:   Exercise Goals and Review:   Exercise Goals     Row Name 12/22/21 1127             Exercise  Goals   Increase Physical Activity Yes       Intervention Provide advice, education, support and counseling about physical activity/exercise needs.;Develop an individualized exercise prescription for aerobic and resistive training based on initial evaluation findings, risk stratification, comorbidities and participant's personal goals.       Expected Outcomes Short Term: Attend rehab on a regular basis to increase amount of physical activity.;Long Term: Add in home exercise to make exercise part of routine and to increase amount of physical activity.;Long Term: Exercising regularly at least 3-5 days a week.       Increase Strength and Stamina Yes       Intervention Provide advice, education, support and counseling about physical activity/exercise needs.;Develop an individualized exercise prescription for aerobic and resistive training based on initial evaluation findings, risk stratification, comorbidities and participant's personal goals.       Expected Outcomes Short Term: Increase workloads from initial exercise prescription for resistance, speed, and METs.;Short Term: Perform resistance training exercises routinely during rehab and add in resistance training at home;Long Term: Improve cardiorespiratory fitness, muscular endurance and strength as measured by increased METs and functional capacity ( )       Able to understand and use rate of perceived exertion (RPE) scale Yes       Intervention Provide education and explanation on how to use RPE scale       Expected Outcomes Short Term: Able to use RPE daily in rehab to express subjective intensity level;Long Term:  Able to use RPE to guide intensity level when exercising independently       Able to understand and use Dyspnea scale Yes       Intervention Provide education and explanation on how to use Dyspnea scale       Expected Outcomes Short Term: Able to use Dyspnea scale daily in rehab to express subjective sense of shortness of breath during  exertion;Long Term: Able to use Dyspnea scale to guide intensity level when exercising independently       Knowledge and understanding of Target Heart Rate Range (THRR) Yes       Intervention Provide education and explanation of THRR including how the numbers were predicted and where they are located for reference       Expected Outcomes Short Term: Able to state/look up THRR;Long Term: Able to use THRR to govern intensity when exercising independently;Short Term: Able to use daily as guideline for intensity in rehab       Understanding of Exercise Prescription Yes       Intervention Provide education, explanation, and written materials on patient's individual exercise prescription       Expected Outcomes Short Term: Able to explain program exercise prescription;Long Term: Able to explain home exercise prescription to exercise independently                Exercise Goals Re-Evaluation :   Discharge Exercise Prescription (Final Exercise Prescription Changes):   Nutrition:  Target Goals: Understanding of nutrition guidelines, daily intake of sodium 1500mg , cholesterol 200mg , calories 30% from fat and 7% or less from saturated fats, daily to have 5 or more servings of fruits and vegetables.  Biometrics:    Nutrition Therapy Plan and Nutrition Goals:   Nutrition Assessments:  MEDIFICTS Score Key: ?70 Need to make dietary changes  40-70 Heart Healthy Diet ? 40 Therapeutic Level Cholesterol Diet   Picture Your Plate Scores: <22 Unhealthy dietary pattern with much room for improvement. 41-50 Dietary pattern unlikely to meet recommendations for good  health and room for improvement. 51-60 More healthful dietary pattern, with some room for improvement.  >60 Healthy dietary pattern, although there may be some specific behaviors that could be improved.    Nutrition Goals Re-Evaluation:   Nutrition Goals Discharge (Final Nutrition Goals Re-Evaluation):   Psychosocial: Target  Goals: Acknowledge presence or absence of significant depression and/or stress, maximize coping skills, provide positive support system. Participant is able to verbalize types and ability to use techniques and skills needed for reducing stress and depression.  Initial Review & Psychosocial Screening:  Initial Psych Review & Screening - 12/22/21 1119       Initial Review   Current issues with None Identified      Family Dynamics   Good Support System? Yes    Comments Darl Pikes      Barriers   Psychosocial barriers to participate in program There are no identifiable barriers or psychosocial needs.      Screening Interventions   Interventions Encouraged to exercise             Quality of Life Scores:  Scores of 19 and below usually indicate a poorer quality of life in these areas.  A difference of  2-3 points is a clinically meaningful difference.  A difference of 2-3 points in the total score of the Quality of Life Index has been associated with significant improvement in overall quality of life, self-image, physical symptoms, and general health in studies assessing change in quality of life.  PHQ-9: Review Flowsheet       12/22/2021  Depression screen PHQ 2/9  Decreased Interest 0  Down, Depressed, Hopeless 0  PHQ - 2 Score 0  Altered sleeping 0  Tired, decreased energy 0  Change in appetite 0  Feeling bad or failure about yourself  0  Trouble concentrating 0  Moving slowly or fidgety/restless 0  Suicidal thoughts 0  PHQ-9 Score 0   Interpretation of Total Score  Total Score Depression Severity:  1-4 = Minimal depression, 5-9 = Mild depression, 10-14 = Moderate depression, 15-19 = Moderately severe depression, 20-27 = Severe depression   Psychosocial Evaluation and Intervention:  Psychosocial Evaluation - 12/22/21 1119       Psychosocial Evaluation & Interventions   Interventions Encouraged to exercise with the program and follow exercise prescription    Expected  Outcomes For Lyda Perone to participate in Pulmonary Rehab    Continue Psychosocial Services  Follow up required by staff             Psychosocial Re-Evaluation:   Psychosocial Discharge (Final Psychosocial Re-Evaluation):   Education: Education Goals: Education classes will be provided on a weekly basis, covering required topics. Participant will state understanding/return demonstration of topics presented.  Learning Barriers/Preferences:  Learning Barriers/Preferences - 12/22/21 1120       Learning Barriers/Preferences   Learning Barriers --   CVA  in 2001 and 2013   Learning Preferences None             Education Topics: Risk Factor Reduction:  -Group instruction that is supported by a PowerPoint presentation. Instructor discusses the definition of a risk factor, different risk factors for pulmonary disease, and how the heart and lungs work together.     Nutrition for Pulmonary Patient:  -Group instruction provided by PowerPoint slides, verbal discussion, and written materials to support subject matter. The instructor gives an explanation and review of healthy diet recommendations, which includes a discussion on weight management, recommendations for fruit and vegetable consumption, as well  as protein, fluid, caffeine, fiber, sodium, sugar, and alcohol. Tips for eating when patients are short of breath are discussed.   Pursed Lip Breathing:  -Group instruction that is supported by demonstration and informational handouts. Instructor discusses the benefits of pursed lip and diaphragmatic breathing and detailed demonstration on how to preform both.     Oxygen Safety:  -Group instruction provided by PowerPoint, verbal discussion, and written material to support subject matter. There is an overview of "What is Oxygen" and "Why do we need it".  Instructor also reviews how to create a safe environment for oxygen use, the importance of using oxygen as prescribed, and the risks of  noncompliance. There is a brief discussion on traveling with oxygen and resources the patient may utilize.   Oxygen Equipment:  -Group instruction provided by [facility] Staff utilizing handouts, written materials, and equipment demonstrations.   Signs and Symptoms:  -Group instruction provided by written material and verbal discussion to support subject matter. Warning signs and symptoms of infection, stroke, and heart attack are reviewed and when to call the physician/911 reinforced. Tips for preventing the spread of infection discussed.   Advanced Directives:  -Group instruction provided by verbal instruction and written material to support subject matter. Instructor reviews Advanced Directive laws and proper instruction for filling out document.   Pulmonary Video:  -Group video education that reviews the importance of medication and oxygen compliance, exercise, good nutrition, pulmonary hygiene, and pursed lip and diaphragmatic breathing for the pulmonary patient.   Exercise for the Pulmonary Patient:  -Group instruction that is supported by a PowerPoint presentation. Instructor discusses benefits of exercise, core components of exercise, frequency, duration, and intensity of an exercise routine, importance of utilizing pulse oximetry during exercise, safety while exercising, and options of places to exercise outside of rehab.     Pulmonary Medications:  -Verbally interactive group education provided by instructor with focus on inhaled medications and proper administration.   Anatomy and Physiology of the Respiratory System and Intimacy:  -Group instruction provided by PowerPoint, verbal discussion, and written material to support subject matter. Instructor reviews respiratory cycle and anatomical components of the respiratory system and their functions. Instructor also reviews differences in obstructive and restrictive respiratory diseases with examples of each. Intimacy, Sex, and  Sexuality differences are reviewed with a discussion on how relationships can change when diagnosed with pulmonary disease. Common sexual concerns are reviewed.   MD DAY -A group question and answer session with a medical doctor that allows participants to ask questions that relate to their pulmonary disease state.   OTHER EDUCATION -Group or individual verbal, written, or video instructions that support the educational goals of the pulmonary rehab program.   Holiday Eating Survival Tips:  -Group instruction provided by PowerPoint slides, verbal discussion, and written materials to support subject matter. The instructor gives patients tips, tricks, and techniques to help them not only survive but enjoy the holidays despite the onslaught of food that accompanies the holidays.   Knowledge Questionnaire Score:  Knowledge Questionnaire Score - 12/22/21 1105       Knowledge Questionnaire Score   Pre Score 16/18             Core Components/Risk Factors/Patient Goals at Admission:  Personal Goals and Risk Factors at Admission - 12/22/21 1121       Core Components/Risk Factors/Patient Goals on Admission    Weight Management Weight Loss    Improve shortness of breath with ADL's Yes    Intervention Provide education,  individualized exercise plan and daily activity instruction to help decrease symptoms of SOB with activities of daily living.    Expected Outcomes Short Term: Improve cardiorespiratory fitness to achieve a reduction of symptoms when performing ADLs;Long Term: Be able to perform more ADLs without symptoms or delay the onset of symptoms             Core Components/Risk Factors/Patient Goals Review:    Core Components/Risk Factors/Patient Goals at Discharge (Final Review):    ITP Comments: Dr. Mechele Collin is Medical Director for Pulmonary Rehab at [facility].

## 2021-12-22 NOTE — Progress Notes (Signed)
Daniel Oconnell 66 y.o. male Pulmonary Rehab Orientation Note This patient who was referred to Pulmonary Rehab by Dr. Francine Graven with the diagnosis of Chronic Respiratory Failure arrived today in Cardiac and Pulmonary Rehab. He arrived ambulatory with limp gait. He does carry portable oxygen. [facility] is the provider for their DME. Per patient, Daniel Oconnell uses oxygen continuously. Color good, skin warm and dry. Patient is oriented to time and place. Patient's medical history, psychosocial health, and medications reviewed. Psychosocial assessment reveals patient lives in assisted living facility. Daniel Oconnell is currently unemployed, disabled. Patient hobbies include watching tv. Patient reports his stress level is low. Areas of stress/anxiety include health. Patient does not exhibit signs of depression. PHQ2/9 score 0/0. Daniel Oconnell shows good  coping skills with positive outlook on life. Offered emotional support and reassurance. Will continue to monitor. Physical assessment performed by Karlene Lineman RN. Please see their orientation physical assessment note. Daniel Oconnell reports he  does take medications as prescribed. Patient states he  follows a regular  diet. The patient has been trying to lose weight through a healthy diet and exercise program.. Patient's weight will be monitored closely. Demonstration and practice of PLB using pulse oximeter. Daniel Oconnell able to return demonstration satisfactorily. Safety and hand hygiene in the exercise area reviewed with patient. Daniel Oconnell voices understanding of the information reviewed. Department expectations discussed with patient and achievable goals were set. The patient shows enthusiasm about attending the program and we look forward to working with Daniel Oconnell. Daniel Oconnell completed a 6 min walk test today and is scheduled to begin exercise on 12/30/21 at 1:15 pm.   [id number] Daniel San, MS, ACSM-CEP

## 2021-12-24 ENCOUNTER — Ambulatory Visit (INDEPENDENT_AMBULATORY_CARE_PROVIDER_SITE_OTHER): Payer: Medicare HMO | Admitting: Pulmonary Disease

## 2021-12-24 ENCOUNTER — Encounter: Payer: Self-pay | Admitting: Nurse Practitioner

## 2021-12-24 ENCOUNTER — Ambulatory Visit: Payer: Medicare HMO | Admitting: Nurse Practitioner

## 2021-12-24 VITALS — BP 110/72 | HR 78 | Temp 98.0°F | Ht 67.0 in | Wt 181.6 lb

## 2021-12-24 DIAGNOSIS — J9611 Chronic respiratory failure with hypoxia: Secondary | ICD-10-CM

## 2021-12-24 DIAGNOSIS — J449 Chronic obstructive pulmonary disease, unspecified: Secondary | ICD-10-CM | POA: Insufficient documentation

## 2021-12-24 DIAGNOSIS — J929 Pleural plaque without asbestos: Secondary | ICD-10-CM | POA: Diagnosis not present

## 2021-12-24 DIAGNOSIS — J439 Emphysema, unspecified: Secondary | ICD-10-CM

## 2021-12-24 DIAGNOSIS — J849 Interstitial pulmonary disease, unspecified: Secondary | ICD-10-CM

## 2021-12-24 LAB — PULMONARY FUNCTION TEST
DL/VA % pred: 51 %
DL/VA: 2.13 ml/min/mmHg/L
DLCO cor % pred: 37 %
DLCO cor: 8.89 ml/min/mmHg
DLCO unc % pred: 37 %
DLCO unc: 8.89 ml/min/mmHg
FEF 25-75 Post: 0.72 L/sec
FEF 25-75 Pre: 0.76 L/sec
FEF2575-%Change-Post: -5 %
FEF2575-%Pred-Post: 31 %
FEF2575-%Pred-Pre: 32 %
FEV1-%Change-Post: -1 %
FEV1-%Pred-Post: 58 %
FEV1-%Pred-Pre: 59 %
FEV1-Post: 1.72 L
FEV1-Pre: 1.75 L
FEV1FVC-%Change-Post: 0 %
FEV1FVC-%Pred-Pre: 83 %
FEV6-%Change-Post: 0 %
FEV6-%Pred-Post: 72 %
FEV6-%Pred-Pre: 72 %
FEV6-Post: 2.73 L
FEV6-Pre: 2.75 L
FEV6FVC-%Change-Post: 1 %
FEV6FVC-%Pred-Post: 104 %
FEV6FVC-%Pred-Pre: 102 %
FVC-%Change-Post: -2 %
FVC-%Pred-Post: 69 %
FVC-%Pred-Pre: 70 %
FVC-Post: 2.77 L
FVC-Pre: 2.84 L
Post FEV1/FVC ratio: 62 %
Post FEV6/FVC ratio: 98 %
Pre FEV1/FVC ratio: 62 %
Pre FEV6/FVC Ratio: 97 %
RV % pred: 59 %
RV: 1.34 L
TLC % pred: 66 %
TLC: 4.27 L

## 2021-12-24 LAB — CBC WITH DIFFERENTIAL/PLATELET
Basophils Absolute: 0.1 10*3/uL (ref 0.0–0.1)
Basophils Relative: 0.4 % (ref 0.0–3.0)
Eosinophils Absolute: 0.2 10*3/uL (ref 0.0–0.7)
Eosinophils Relative: 1.3 % (ref 0.0–5.0)
HCT: 41.6 % (ref 39.0–52.0)
Hemoglobin: 13.9 g/dL (ref 13.0–17.0)
Lymphocytes Relative: 13.5 % (ref 12.0–46.0)
Lymphs Abs: 1.6 10*3/uL (ref 0.7–4.0)
MCHC: 33.3 g/dL (ref 30.0–36.0)
MCV: 84 fl (ref 78.0–100.0)
Monocytes Absolute: 0.7 10*3/uL (ref 0.1–1.0)
Monocytes Relative: 6.4 % (ref 3.0–12.0)
Neutro Abs: 9.2 10*3/uL — ABNORMAL HIGH (ref 1.4–7.7)
Neutrophils Relative %: 78.4 % — ABNORMAL HIGH (ref 43.0–77.0)
Platelets: 240 10*3/uL (ref 150.0–400.0)
RBC: 4.96 Mil/uL (ref 4.22–5.81)
RDW: 14.1 % (ref 11.5–15.5)
WBC: 11.7 10*3/uL — ABNORMAL HIGH (ref 4.0–10.5)

## 2021-12-24 NOTE — Progress Notes (Signed)
Pulmonary Individual Treatment Plan  Patient Details  Name: Daniel Oconnell MRN: [id number] Date of Birth: [birth year] Referring Provider:   Doristine DevoidFlowsheet Row Pulmonary Rehab Walk [facility]  Referring Provider [name]       Initial Encounter Date:  Flowsheet Row Pulmonary Rehab [facility]  Date 12/22/21       Visit Diagnosis: Chronic respiratory failure with hypoxia (HCC)  Patient's Home Medications on Admission:   Current Outpatient Medications:    acetaminophen (TYLENOL) 500 MG tablet, Take 500 mg by mouth every 4 (four) hours as needed for mild pain, moderate pain, fever or headache., Disp: , Rfl:    albuterol (PROVENTIL) (2.5 MG/3ML) 0.083% nebulizer solution, Take 3 mLs (2.5 mg total) by nebulization every 4 (four) hours as needed for wheezing or shortness of breath., Disp: 75 mL, Rfl: 0   albuterol (VENTOLIN HFA) 108 (90 Base) MCG/ACT inhaler, Inhale 1-2 puffs into the lungs every 6 (six) hours as needed for wheezing or shortness of breath., Disp: 8 g, Rfl: 6   alum & mag hydroxide-simeth (MAALOX/MYLANTA) 200-200-20 MG/5ML suspension, Take 30 mLs by mouth as needed for indigestion or heartburn., Disp: , Rfl:    ASPIRIN 81 PO, Take 81 mg by mouth daily., Disp: , Rfl:    atorvastatin (LIPITOR) 80 MG tablet, Take 80 mg by mouth at bedtime. , Disp: , Rfl:    baclofen (LIORESAL) 20 MG tablet, Take 20 mg by mouth 3 (three) times daily., Disp: , Rfl:    clopidogrel (PLAVIX) 75 MG tablet, Take 75 mg by mouth daily with breakfast. , Disp: , Rfl:    dicyclomine (BENTYL) 10 MG capsule, Take 10 mg by mouth daily., Disp: , Rfl:    docusate sodium (COLACE) 100 MG capsule, Take 100 mg by mouth 2 (two) times daily., Disp: , Rfl:    Glycopyrrolate-Formoterol (BEVESPI AEROSPHERE) 9-4.8 MCG/ACT AERO, Inhale 2 puffs into the lungs 2 (two) times daily., Disp: 10.7 g, Rfl: 6   guaifenesin  (ROBITUSSIN) 100 MG/5ML syrup, Take 200 mg by mouth every 6 (six) hours as needed for cough., Disp: , Rfl:    hydrALAZINE (APRESOLINE) 100 MG tablet, Take 1 tablet (100 mg total) by mouth 3 (three) times daily., Disp: 270 tablet, Rfl: 3   levETIRAcetam (KEPPRA) 750 MG tablet, Take 750 mg by mouth 2 (two) times daily., Disp: , Rfl:    levothyroxine (SYNTHROID, LEVOTHROID) 25 MCG tablet, Take 1 tablet by mouth daily., Disp: , Rfl:    loperamide (IMODIUM) 2 MG capsule, Take by mouth as needed for diarrhea or loose stools., Disp: , Rfl:    magnesium hydroxide (MILK OF MAGNESIA) 400 MG/5ML suspension, Take 30 mLs by mouth at bedtime as needed for mild constipation., Disp: , Rfl:    melatonin 3 MG TABS tablet, Take 3 mg by mouth at bedtime., Disp: , Rfl:    mometasone (NASONEX) 50 MCG/ACT nasal spray, SMARTSIG:Both Nares, Disp: , Rfl:    neomycin-bacitracin-polymyxin (NEOSPORIN) ointment, Apply 1 application. topically as needed for wound care., Disp: , Rfl:    Polyethyl Glycol-Propyl Glycol (SYSTANE ULTRA) 0.4-0.3 % SOLN, Apply 1 drop to eye 3 (three) times daily., Disp: , Rfl:    polyethylene glycol powder (GLYCOLAX/MIRALAX) powder, Take 17 g by mouth 2 (two) times daily. Use for constipation, as prescribed, until daily soft stools  OTC, Disp: 119 g, Rfl: 0   potassium chloride (KLOR-CON M) 10 MEQ tablet, Take  2 tablets (20 mEq total) by mouth daily., Disp: 180 tablet, Rfl: 3   sertraline (ZOLOFT) 100 MG tablet, Take 100 mg by mouth daily with breakfast. 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    Hyperlipidemia    Hypertension     Tobacco Use: Social History   Tobacco Use  Smoking Status Former   Packs/day: 1.00   Years: 28.00   Total pack years: 28.00   Types: Cigarettes   Quit date: 2013   Years since quitting: 10.6  Smokeless Tobacco Never    Labs: Review Flowsheet       Latest Ref Rng & Units 04/16/2016 03/10/2017 03/05/2020 07/24/2021  Labs for ITP Cardiac and Pulmonary Rehab  Cholestrol 100 - 199 mg/dL - 993  570  177   LDL (calc) 0 - 99 mg/dL - 47  52  59   HDL-C >93 mg/dL - 35  35  38   Trlycerides 0 - 149 mg/dL - 903  009  73   PH, Arterial 7.350 - 7.450 7.369  - - -  PCO2 arterial 32.0 - 48.0 mmHg 42.7  - - -  Bicarbonate 20.0 - 28.0 mmol/L 24.0  - - -  Acid-base deficit 0.0 - 2.0 mmol/L 0.9  - - -  O2 Saturation % 88.4  - - -    Capillary Blood Glucose: Lab Results  Component Value Date   GLUCAP 90 02/04/2019     Pulmonary Assessment Scores:  Pulmonary Assessment Scores     Row Name 12/22/21 1109         ADL UCSD   ADL Phase Entry     SOB Score total 106       CAT Score   CAT Score 30       mMRC Score   mMRC Score 4             UCSD: Self-administered rating of dyspnea associated with activities of daily living (ADLs) 6-point scale (0 = "not at all" to 5 = "maximal or unable to do because of breathlessness")  Scoring Scores range from 0 to 120.  Minimally important difference is 5 units  CAT: CAT can identify the health impairment of COPD patients and is better correlated with disease progression.  CAT  has a scoring range of zero to 40. The CAT score is classified into four groups of low (less than 10), medium (10 - 20), high (21-30) and very high (31-40) based on the impact level of disease on health status. A CAT score over  10 suggests significant symptoms.  A worsening CAT score could be explained by an exacerbation, poor medication adherence, poor inhaler technique, or progression of COPD or comorbid conditions.  CAT MCID is 2 points  mMRC: mMRC (Modified Medical Research Council) Dyspnea Scale is used to assess the degree of baseline functional disability in patients of respiratory disease due to dyspnea. No minimal important difference is established. A decrease in score of 1 point or greater is considered a positive change.   Pulmonary Function Assessment:  Pulmonary Function Assessment - 12/22/21 1137       Breath   Bilateral Breath Sounds Clear;Decreased    Shortness of Breath Yes;Fear of Shortness of Breath;Limiting activity;Panic with Shortness of Breath             Exercise Target Goals: Exercise Program Goal: Individual exercise prescription set using results from initial 6 min walk test and THRR while considering  patient's activity barriers and safety.   Exercise Prescription Goal: Initial exercise prescription builds to 30-45 minutes a day of aerobic activity, 2-3 days per week.  Home exercise guidelines will be given to patient during program as part of exercise prescription that the participant will acknowledge.  Activity Barriers & Risk Stratification:  Activity Barriers & Cardiac Risk Stratification - 12/22/21 1126       Activity Barriers & Cardiac Risk Stratification   Activity Barriers Arthritis;Back Problems;Neck/Spine Problems;History of Falls;Muscular Weakness;Deconditioning;Shortness of Breath   CVA in 2001, 2013            6 Minute Walk:  6 Minute Walk     Row Name 12/22/21 1238         6 Minute Walk   Phase Initial     Distance 180 feet      Walk Time 6 minutes     # of Rest Breaks 3  0:34-1:132, 2:39-5:00, 5:30-6:00     MPH 0.34     METS 1.18     RPE 19     Perceived Dyspnea  4     VO2 Peak 4.12     Symptoms No     Resting HR 69 bpm     Resting BP 122/66     Resting Oxygen Saturation  90 %     Exercise Oxygen Saturation  during 6 min walk 91 %     Max Ex. HR 85 bpm     Max Ex. BP 156/78     2 Minute Post BP 130/76       Interval HR   1 Minute HR 76     2 Minute HR 78     3 Minute HR 73     4 Minute HR 62     5 Minute HR 73     6 Minute HR 85     2 Minute Post HR 62     Interval Heart Rate? Yes       Interval Oxygen   Interval Oxygen? Yes     Baseline Oxygen Saturation % 90 %     1 Minute Oxygen Saturation % 94 %     1 Minute Liters of Oxygen 3 L     2 Minute Oxygen Saturation % 92 %     2 Minute Liters of Oxygen 3 L     3 Minute Oxygen Saturation % 93 %     3 Minute Liters of Oxygen 3 L     4 Minute Oxygen Saturation % 95 %     4  Minute Liters of Oxygen 3 L     5 Minute Oxygen Saturation % 97 %     5 Minute Liters of Oxygen 3 L     6 Minute Oxygen Saturation % 91 %     6 Minute Liters of Oxygen 3 L     2 Minute Post Oxygen Saturation % 96 %     2 Minute Post Liters of Oxygen 3 L              Oxygen Initial Assessment:  Oxygen Initial Assessment - 12/22/21 1122       Home Oxygen   Home Oxygen Device Portable Concentrator;Home Concentrator    Sleep Oxygen Prescription Continuous    Liters per minute 3    Home Exercise Oxygen Prescription Pulsed    Liters per minute 4    Home Resting Oxygen Prescription Continuous    Liters per minute 3    Compliance with Home Oxygen Use Yes      Initial 6 min Walk   Oxygen Used Continuous    Liters per minute 3      Program Oxygen Prescription   Program Oxygen Prescription Continuous    Liters per minute 3      Intervention   Short Term Goals To learn and exhibit compliance with exercise, home and travel O2 prescription;To learn and understand  importance of monitoring SPO2 with pulse oximeter and demonstrate accurate use of the pulse oximeter.;To learn and understand importance of maintaining oxygen saturations>88%;To learn and demonstrate proper pursed lip breathing techniques or other breathing techniques. ;To learn and demonstrate proper use of respiratory medications    Long  Term Goals Exhibits compliance with exercise, home  and travel O2 prescription;Verbalizes importance of monitoring SPO2 with pulse oximeter and return demonstration;Maintenance of O2 saturations>88%;Exhibits proper breathing techniques, such as pursed lip breathing or other method taught during program session;Compliance with respiratory medication;Demonstrates proper use of MDI's             Oxygen Re-Evaluation:  Oxygen Re-Evaluation     Row Name 12/23/21 0912             Program Oxygen Prescription   Program Oxygen Prescription Continuous       Liters per minute 3         Home Oxygen   Home Oxygen Device Portable Concentrator;Home Concentrator       Sleep Oxygen Prescription Continuous       Liters per minute 3       Home Exercise Oxygen Prescription Pulsed       Liters per minute 4       Home Resting Oxygen Prescription Continuous       Liters per minute 3       Compliance with Home Oxygen Use Yes         Goals/Expected Outcomes   Short Term Goals To learn and exhibit compliance with exercise, home and travel O2 prescription;To learn and understand importance of monitoring SPO2 with pulse oximeter and demonstrate accurate use of the pulse oximeter.;To learn and understand importance of maintaining oxygen saturations>88%;To learn and demonstrate proper pursed lip breathing techniques or other breathing techniques. ;To learn and demonstrate proper use of respiratory medications       Long  Term Goals Exhibits compliance with exercise, home  and travel O2 prescription;Verbalizes importance of monitoring SPO2 with pulse oximeter and return  demonstration;Maintenance of O2 saturations>88%;Exhibits proper breathing techniques, such as pursed lip breathing or other method taught during  program session;Compliance with respiratory medication;Demonstrates proper use of MDI's       Goals/Expected Outcomes Compliance and understanding of oxygen saturation monitoring and breathing techniques to decrease shortness of breath.                Oxygen Discharge (Final Oxygen Re-Evaluation):  Oxygen Re-Evaluation - 12/23/21 0912       Program Oxygen Prescription   Program Oxygen Prescription Continuous    Liters per minute 3      Home Oxygen   Home Oxygen Device Portable Concentrator;Home Concentrator    Sleep Oxygen Prescription Continuous    Liters per minute 3    Home Exercise Oxygen Prescription Pulsed    Liters per minute 4    Home Resting Oxygen Prescription Continuous    Liters per minute 3    Compliance with Home Oxygen Use Yes      Goals/Expected Outcomes   Short Term Goals To learn and exhibit compliance with exercise, home and travel O2 prescription;To learn and understand importance of monitoring SPO2 with pulse oximeter and demonstrate accurate use of the pulse oximeter.;To learn and understand importance of maintaining oxygen saturations>88%;To learn and demonstrate proper pursed lip breathing techniques or other breathing techniques. ;To learn and demonstrate proper use of respiratory medications    Long  Term Goals Exhibits compliance with exercise, home  and travel O2 prescription;Verbalizes importance of monitoring SPO2 with pulse oximeter and return demonstration;Maintenance of O2 saturations>88%;Exhibits proper breathing techniques, such as pursed lip breathing or other method taught during program session;Compliance with respiratory medication;Demonstrates proper use of MDI's    Goals/Expected Outcomes Compliance and understanding of oxygen saturation monitoring and breathing techniques to decrease shortness of  breath.             Initial Exercise Prescription:  Initial Exercise Prescription - 12/22/21 1200       Date of Initial Exercise RX and Referring Provider   Date 12/22/21    Referring Provider [name]    Expected Discharge Date 02/26/22      Oxygen   Oxygen Continuous    Liters 3    Maintain Oxygen Saturation 88% or higher      Recumbant Bike   Level 1    RPM 20    Minutes 25      Prescription Details   Frequency (times per week) 2    Duration Progress to 30 minutes of continuous aerobic without signs/symptoms of physical distress      Intensity   THRR 40-80% of Max Heartrate 61-122    Ratings of Perceived Exertion 11-13    Perceived Dyspnea 0-4      Progression   Progression Continue to progress workloads to maintain intensity without signs/symptoms of physical distress.      Resistance Training   Training Prescription Yes    Weight yellow bands    Reps 10-15             Perform Capillary Blood Glucose checks as needed.  Exercise Prescription Changes:   Exercise Comments:   Exercise Goals and Review:   Exercise Goals     Row Name 12/22/21 1127 12/23/21 0911           Exercise Goals   Increase Physical Activity Yes Yes      Intervention Provide advice, education, support and counseling about physical activity/exercise needs.;Develop an individualized exercise prescription for aerobic and resistive training based on initial evaluation findings, risk stratification, comorbidities and participant's personal goals. Provide advice, education, support and  counseling about physical activity/exercise needs.;Develop an individualized exercise prescription for aerobic and resistive training based on initial evaluation findings, risk stratification, comorbidities and participant's personal goals.      Expected Outcomes Short Term: Attend rehab on a regular basis to increase amount of physical activity.;Long Term: Add in home exercise to make exercise part of  routine and to increase amount of physical activity.;Long Term: Exercising regularly at least 3-5 days a week. Short Term: Attend rehab on a regular basis to increase amount of physical activity.;Long Term: Add in home exercise to make exercise part of routine and to increase amount of physical activity.;Long Term: Exercising regularly at least 3-5 days a week.      Increase Strength and Stamina Yes Yes      Intervention Provide advice, education, support and counseling about physical activity/exercise needs.;Develop an individualized exercise prescription for aerobic and resistive training based on initial evaluation findings, risk stratification, comorbidities and participant's personal goals. Provide advice, education, support and counseling about physical activity/exercise needs.;Develop an individualized exercise prescription for aerobic and resistive training based on initial evaluation findings, risk stratification, comorbidities and participant's personal goals.      Expected Outcomes Short Term: Increase workloads from initial exercise prescription for resistance, speed, and METs.;Short Term: Perform resistance training exercises routinely during rehab and add in resistance training at home;Long Term: Improve cardiorespiratory fitness, muscular endurance and strength as measured by increased METs and functional capacity ( ) Short Term: Increase workloads from initial exercise prescription for resistance, speed, and METs.;Short Term: Perform resistance training exercises routinely during rehab and add in resistance training at home;Long Term: Improve cardiorespiratory fitness, muscular endurance and strength as measured by increased METs and functional capacity ( )      Able to understand and use rate of perceived exertion (RPE) scale Yes Yes      Intervention Provide education and explanation on how to use RPE scale Provide education and explanation on how to use RPE scale      Expected Outcomes  Short Term: Able to use RPE daily in rehab to express subjective intensity level;Long Term:  Able to use RPE to guide intensity level when exercising independently Short Term: Able to use RPE daily in rehab to express subjective intensity level;Long Term:  Able to use RPE to guide intensity level when exercising independently      Able to understand and use Dyspnea scale Yes Yes      Intervention Provide education and explanation on how to use Dyspnea scale Provide education and explanation on how to use Dyspnea scale      Expected Outcomes Short Term: Able to use Dyspnea scale daily in rehab to express subjective sense of shortness of breath during exertion;Long Term: Able to use Dyspnea scale to guide intensity level when exercising independently Short Term: Able to use Dyspnea scale daily in rehab to express subjective sense of shortness of breath during exertion;Long Term: Able to use Dyspnea scale to guide intensity level when exercising independently      Knowledge and understanding of Target Heart Rate Range (THRR) Yes Yes      Intervention Provide education and explanation of THRR including how the numbers were predicted and where they are located for reference Provide education and explanation of THRR including how the numbers were predicted and where they are located for reference      Expected Outcomes Short Term: Able to state/look up THRR;Long Term: Able to use THRR to govern intensity when exercising independently;Short Term: Able to use  daily as guideline for intensity in rehab Short Term: Able to state/look up THRR;Long Term: Able to use THRR to govern intensity when exercising independently;Short Term: Able to use daily as guideline for intensity in rehab      Understanding of Exercise Prescription Yes Yes      Intervention Provide education, explanation, and written materials on patient's individual exercise prescription Provide education, explanation, and written materials on patient's  individual exercise prescription      Expected Outcomes Short Term: Able to explain program exercise prescription;Long Term: Able to explain home exercise prescription to exercise independently Short Term: Able to explain program exercise prescription;Long Term: Able to explain home exercise prescription to exercise independently               Exercise Goals Re-Evaluation :  Exercise Goals Re-Evaluation     Row Name 12/23/21 0911             Exercise Goal Re-Evaluation   Exercise Goals Review Increase Physical Activity;Increase Strength and Stamina;Able to understand and use rate of perceived exertion (RPE) scale;Able to understand and use Dyspnea scale;Knowledge and understanding of Target Heart Rate Range (THRR);Understanding of Exercise Prescription       Comments Demauri is scheduled to begin exercise next week. Will continue to monitor and progress as able.       Expected Outcomes Through exercise at rehab and home, the patient will decrease shortness of breath with daily activities and feel confident in carrying out an exercise regimen at home.                Discharge Exercise Prescription (Final Exercise Prescription Changes):   Nutrition:  Target Goals: Understanding of nutrition guidelines, daily intake of sodium 1500mg , cholesterol 200mg , calories 30% from fat and 7% or less from saturated fats, daily to have 5 or more servings of fruits and vegetables.  Biometrics:    Nutrition Therapy Plan and Nutrition Goals:   Nutrition Assessments:  MEDIFICTS Score Key: ?70 Need to make dietary changes  40-70 Heart Healthy Diet ? 40 Therapeutic Level Cholesterol Diet   Picture Your Plate Scores: <19 Unhealthy dietary pattern with much room for improvement. 41-50 Dietary pattern unlikely to meet recommendations for good health and room for improvement. 51-60 More healthful dietary pattern, with some room for improvement.  >60 Healthy dietary pattern, although there  may be some specific behaviors that could be improved.    Nutrition Goals Re-Evaluation:   Nutrition Goals Discharge (Final Nutrition Goals Re-Evaluation):   Psychosocial: Target Goals: Acknowledge presence or absence of significant depression and/or stress, maximize coping skills, provide positive support system. Participant is able to verbalize types and ability to use techniques and skills needed for reducing stress and depression.  Initial Review & Psychosocial Screening:  Initial Psych Review & Screening - 12/22/21 1119       Initial Review   Current issues with None Identified      Family Dynamics   Good Support System? Yes    Comments Darl Pikes      Barriers   Psychosocial barriers to participate in program There are no identifiable barriers or psychosocial needs.      Screening Interventions   Interventions Encouraged to exercise             Quality of Life Scores:  Scores of 19 and below usually indicate a poorer quality of life in these areas.  A difference of  2-3 points is a clinically meaningful difference.  A difference of 2-3  points in the total score of the Quality of Life Index has been associated with significant improvement in overall quality of life, self-image, physical symptoms, and general health in studies assessing change in quality of life.  PHQ-9: Review Flowsheet       12/22/2021  Depression screen PHQ 2/9  Decreased Interest 0  Down, Depressed, Hopeless 0  PHQ - 2 Score 0  Altered sleeping 0  Tired, decreased energy 0  Change in appetite 0  Feeling bad or failure about yourself  0  Trouble concentrating 0  Moving slowly or fidgety/restless 0  Suicidal thoughts 0  PHQ-9 Score 0   Interpretation of Total Score  Total Score Depression Severity:  1-4 = Minimal depression, 5-9 = Mild depression, 10-14 = Moderate depression, 15-19 = Moderately severe depression, 20-27 = Severe depression   Psychosocial Evaluation and Intervention:   Psychosocial Evaluation - 12/22/21 1119       Psychosocial Evaluation & Interventions   Interventions Encouraged to exercise with the program and follow exercise prescription    Expected Outcomes For Lyda PeroneKirk to participate in Pulmonary Rehab    Continue Psychosocial Services  Follow up required by staff             Psychosocial Re-Evaluation:  Psychosocial Re-Evaluation     Row Name 12/24/21 0912             Psychosocial Re-Evaluation   Current issues with None Identified       Comments Kever has attented the PR orientation session 12/22/21. He is scheduled to start attending PR 12/30/21. Will continue to monitor for any psychosocial barriers.       Expected Outcomes For him to participate in the program without any psychosocial issues or concerns.       Interventions Encouraged to attend Pulmonary Rehabilitation for the exercise       Continue Psychosocial Services  Follow up required by staff                Psychosocial Discharge (Final Psychosocial Re-Evaluation):  Psychosocial Re-Evaluation - 12/24/21 0912       Psychosocial Re-Evaluation   Current issues with None Identified    Comments Nolde has attented the PR orientation session 12/22/21. He is scheduled to start attending PR 12/30/21. Will continue to monitor for any psychosocial barriers.    Expected Outcomes For him to participate in the program without any psychosocial issues or concerns.    Interventions Encouraged to attend Pulmonary Rehabilitation for the exercise    Continue Psychosocial Services  Follow up required by staff             Education: Education Goals: Education classes will be provided on a weekly basis, covering required topics. Participant will state understanding/return demonstration of topics presented.  Learning Barriers/Preferences:  Learning Barriers/Preferences - 12/22/21 1120       Learning Barriers/Preferences   Learning Barriers --   CVA  in 2001 and 2013   Learning  Preferences None             Education Topics: Risk Factor Reduction:  -Group instruction that is supported by a PowerPoint presentation. Instructor discusses the definition of a risk factor, different risk factors for pulmonary disease, and how the heart and lungs work together.     Nutrition for Pulmonary Patient:  -Group instruction provided by PowerPoint slides, verbal discussion, and written materials to support subject matter. The instructor gives an explanation and review of healthy diet recommendations, which includes a discussion on  weight management, recommendations for fruit and vegetable consumption, as well as protein, fluid, caffeine, fiber, sodium, sugar, and alcohol. Tips for eating when patients are short of breath are discussed.   Pursed Lip Breathing:  -Group instruction that is supported by demonstration and informational handouts. Instructor discusses the benefits of pursed lip and diaphragmatic breathing and detailed demonstration on how to preform both.     Oxygen Safety:  -Group instruction provided by PowerPoint, verbal discussion, and written material to support subject matter. There is an overview of "What is Oxygen" and "Why do we need it".  Instructor also reviews how to create a safe environment for oxygen use, the importance of using oxygen as prescribed, and the risks of noncompliance. There is a brief discussion on traveling with oxygen and resources the patient may utilize.   Oxygen Equipment:  -Group instruction provided by [facility] Staff utilizing handouts, written materials, and equipment demonstrations.   Signs and Symptoms:  -Group instruction provided by written material and verbal discussion to support subject matter. Warning signs and symptoms of infection, stroke, and heart attack are reviewed and when to call the physician/911 reinforced. Tips for preventing the spread of infection discussed.   Advanced Directives:  -Group instruction  provided by verbal instruction and written material to support subject matter. Instructor reviews Advanced Directive laws and proper instruction for filling out document.   Pulmonary Video:  -Group video education that reviews the importance of medication and oxygen compliance, exercise, good nutrition, pulmonary hygiene, and pursed lip and diaphragmatic breathing for the pulmonary patient.   Exercise for the Pulmonary Patient:  -Group instruction that is supported by a PowerPoint presentation. Instructor discusses benefits of exercise, core components of exercise, frequency, duration, and intensity of an exercise routine, importance of utilizing pulse oximetry during exercise, safety while exercising, and options of places to exercise outside of rehab.     Pulmonary Medications:  -Verbally interactive group education provided by instructor with focus on inhaled medications and proper administration.   Anatomy and Physiology of the Respiratory System and Intimacy:  -Group instruction provided by PowerPoint, verbal discussion, and written material to support subject matter. Instructor reviews respiratory cycle and anatomical components of the respiratory system and their functions. Instructor also reviews differences in obstructive and restrictive respiratory diseases with examples of each. Intimacy, Sex, and Sexuality differences are reviewed with a discussion on how relationships can change when diagnosed with pulmonary disease. Common sexual concerns are reviewed.   MD DAY -A group question and answer session with a medical doctor that allows participants to ask questions that relate to their pulmonary disease state.   OTHER EDUCATION -Group or individual verbal, written, or video instructions that support the educational goals of the pulmonary rehab program.   Holiday Eating Survival Tips:  -Group instruction provided by PowerPoint slides, verbal discussion, and written materials to  support subject matter. The instructor gives patients tips, tricks, and techniques to help them not only survive but enjoy the holidays despite the onslaught of food that accompanies the holidays.   Knowledge Questionnaire Score:  Knowledge Questionnaire Score - 12/22/21 1105       Knowledge Questionnaire Score   Pre Score 16/18             Core Components/Risk Factors/Patient Goals at Admission:  Personal Goals and Risk Factors at Admission - 12/22/21 1121       Core Components/Risk Factors/Patient Goals on Admission    Weight Management Weight Loss    Improve shortness of  breath with ADL's Yes    Intervention Provide education, individualized exercise plan and daily activity instruction to help decrease symptoms of SOB with activities of daily living.    Expected Outcomes Short Term: Improve cardiorespiratory fitness to achieve a reduction of symptoms when performing ADLs;Long Term: Be able to perform more ADLs without symptoms or delay the onset of symptoms             Core Components/Risk Factors/Patient Goals Review:   Goals and Risk Factor Review     Row Name 12/24/21 0916             Core Components/Risk Factors/Patient Goals Review   Personal Goals Review Other       Review Weight loss has been a concern for him. He is scheduled to start PR 12/30/21. Will continue to monitor core components.       Expected Outcomes See admission goals.                Core Components/Risk Factors/Patient Goals at Discharge (Final Review):   Goals and Risk Factor Review - 12/24/21 0916       Core Components/Risk Factors/Patient Goals Review   Personal Goals Review Other    Review Weight loss has been a concern for him. He is scheduled to start PR 12/30/21. Will continue to monitor core components.    Expected Outcomes See admission goals.             ITP Comments: Recommend continued exercise, life style modification, education, and utilization of breathing  techniques to increase stamina and strength, while also decreasing shortness of breath with exertion.  Dr. Mechele Collin is Medical Director for Pulmonary Rehab at [facility].

## 2021-12-24 NOTE — Assessment & Plan Note (Signed)
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 High symptom burden, multifactorial. Discussed trial of step up to triple therapy with Daniel Oconnell - provided with samples today. Advised to notify if he feels breathing improves so we can send rx. Continue PRN albuterol.   Patient Instructions  Continue Albuterol inhaler 2 puffs or 3 mL neb every 6 hours as needed for shortness of breath or wheezing. Notify if symptoms persist despite rescue inhaler/neb use.  Stop Bevespi. Trial Breztri 2 puffs Twice daily. Brush tongue and rinse mouth afterwards. If no significant change in your breathing, you can go back to using Bevespi.  Continue torsemide 2 tab daily as directed by cardiology  Continue supplemental oxygen 3-4 lpm to maintain oxygen levels greater 88-90%   Labs today    Attend pulmonary rehab    Follow up in 4 weeks with Daniel Oconnell. If symptoms do not improve or worsen, please contact office for sooner follow up or seek emergency care.

## 2021-12-24 NOTE — Progress Notes (Signed)
@Patient  ID: , male    DOB: [birth year], 66 y.o.   MRN: [id number]  Chief Complaint  Patient presents with   Follow-up    He reports that his breathing is about the same.     Referring provider: [id number], *  HPI: 66 year old male, former smoker followed for COPD with emphysema, chronic respiratory failure and DOE.  He is a patient Dr. 73 and last seen in office 07/18/2021.  He is also followed by the lung cancer screening program.  Past medical history significant for hypertension, CAD status post CABG, CHF, history of CVA with right-sided weakness and aphasia.  TEST/EVENTS:  08/04/2021 echocardiogram: EF 60 to 65% diastolic parameters were normal.  RV size and function was normal.  There was mild MR. 08/07/2021: proBNP 3009 08/15/2021 LDCT lung cancer screening: Atherosclerosis and CAD.  Status post CABG.  No LAD is present.  No suspicious appearing pulmonary nodules or masses.  Small right pleural effusion lung dependently with pleural thickening.  There is also a trace amount of left-sided pleural thickening as well also with a small amount of pleural calcifications in the left hemithorax.  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  Mild diffuse bronchial wall thickening with very mild centrilobular and paraseptal emphysema. 12/11/2021 HRCT chest outside facility: Status post CABG.  Heavy calcifications in the native coronary arteries.  Increased size of pulmonary artery measuring up to 3.5 cm.  Background of advanced destructive centrilobular emphysema with diffuse interlobular septal thickening and bronchial wall thickening throughout both lungs.  There is bilateral diffuse midlung predominant micro nodularity and mediastinal/hilar lymphadenopathy that may reflect a superimposed infectious or inflammatory process in the correct clinical setting or a pneumoconiosis, sarcoidosis or hypersensitivity pneumonitis.  Multiple pleural plaques, some of which are  calcified and likely related to prior asbestos exposure. 12/24/2021 PFT: FVC 70, FEV1 59, ratio 62, TLC 66, DLCO 37%.  Mixed obstructive and restrictive disease with severe diffusion defect.  No significant BD.  07/18/2021: OV with Dr. 09/17/2021.  Referred to pulmonary clinic for COPD.  He was previously hospitalized in 2017 with pneumonia and has been on supplemental oxygen since.  Currently not on any maintenance inhaler and using albuterol 3 times a day.  He has had progressive dyspnea with PT/OT sessions at [facility].  Increased oxygen from 2.5 L to 4 L minute without physical activity.  No cough or wheezing.  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  He was started on LAMA/LABA therapy with Bevespi.  He was referred to lung cancer screening program.  11/11/2021: 11/13/2021 with Griffon Herberg NP for follow-up.  He has had a progressive decline in his breathing over the last 6 months to a year.  He was previously started on Bevespi inhaler in March and does feel like this has helped some; however he continues to have trouble with activity tolerance and gets winded easily especially during his physical therapy and occupational therapy sessions.  He denies any cough, wheezing, hemoptysis, weight loss, orthopnea, lower extremity swelling.  He still uses albuterol occasionally; usually when he is exerting himself such as with therapy sessions or when he is leaving his rehab facility.  He continues on 3-4 lpm supplemental O2 and has maintain sats greater than 90% on this.  His CT scan from March did show some changes consistent with asbestos related lung disease.  He does not have any known exposure to asbestos that he  can think of.  He is also being evaluated by cardiology as he has had some evidence of volume overload and significantly elevated BNP.  He was recently changed from Lasix to torsemide.  Has not noticed a huge difference in  his breathing since this.  He has follow-up scheduled on Friday. Set up for PFT and HRCT for further evaluation of degree of obstruction and concern for ILD. Referred to pulmonary rehab.   12/24/2021: Today - follow up Patient presents today with wife for follow up. Since I saw him last, he completed his high resolution CT scan which showed advanced destructive emphysema, changes consistent with pneumoconiosis, sarcoid or hypersensitivity pneumonitis, as well as asbestos related lung disease with pleural thickening. 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 He also underwent PFTs today which showed a mixed obstructive and restrictive defect with severe diffusion defect. Today, he reports feeling relatively the same as when I saw him last. Still experiencing dyspnea upon exertion, worse later in the day, but unchanged from his recent baseline. Oxygen levels have been in the 90's as long as he wears his 2-4 lpm supplemental oxygen. If he's on room air and exerts himself, he will drop to the mid 80's. Denies any cough, wheezing, hemoptysis, night sweats, fevers, anorexia, weight loss. Leg swelling has improved. He denies orthopnea or PND. Currently on Bevespi; feels some benefit from use. Uses neb treatments prior to activity.   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)    CAD (coronary artery disease) 2001   CABG   COPD (chronic obstructive pulmonary disease) (HCC)    CVA (cerebral vascular accident) (HCC) 2001, 2013   x2   Expressive  aphasia    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     albuterol (PROVENTIL) (2.5 MG/3ML) 0.083% nebulizer solution Take 3 mLs (2.5 mg total) by nebulization every 4 (four) hours as needed for wheezing or shortness of breath. 75 mL 0   albuterol (VENTOLIN HFA) 108 (90 Base) MCG/ACT inhaler Inhale 1-2 puffs into the lungs every 6 (six) hours as needed for wheezing or shortness of breath. 8 g 6   alum & mag hydroxide-simeth (MAALOX/MYLANTA) 200-200-20 MG/5ML suspension Take 30 mLs by mouth as needed for indigestion or heartburn.     ASPIRIN 81 PO Take 81 mg by mouth daily.     atorvastatin (LIPITOR) 80 MG tablet Take 80 mg by mouth at bedtime.  baclofen (LIORESAL) 20 MG tablet Take 20 mg by mouth 3 (three) times daily.     clopidogrel (PLAVIX) 75 MG tablet Take 75 mg by mouth daily with breakfast.      dicyclomine (BENTYL) 10 MG capsule Take 10 mg by mouth daily.     docusate sodium (COLACE) 100 MG capsule Take 100 mg by mouth 2 (two) times daily.     Glycopyrrolate-Formoterol (BEVESPI AEROSPHERE) 9-4.8 MCG/ACT AERO Inhale 2 puffs into the lungs 2 (two) times daily. 10.7 g 6   guaifenesin (ROBITUSSIN) 100 MG/5ML syrup Take 200 mg by mouth every 6 (six) hours as needed for cough.     hydrALAZINE (APRESOLINE) 100 MG tablet Take 1 tablet (100 mg total) by mouth 3 (three) times daily. 270 tablet 3   levETIRAcetam (KEPPRA) 750 MG tablet Take 750 mg by mouth 2 (two) times  daily.     levothyroxine (SYNTHROID, LEVOTHROID) 25 MCG tablet Take 1 tablet by mouth daily.     loperamide (IMODIUM) 2 MG capsule Take by mouth as needed for diarrhea or loose stools.     magnesium hydroxide (MILK OF MAGNESIA) 400 MG/5ML suspension Take 30 mLs by mouth at bedtime as needed for mild constipation.     melatonin 3 MG TABS tablet Take 3 mg by mouth at bedtime.     mometasone (NASONEX) 50 MCG/ACT nasal spray SMARTSIG:Both Nares     neomycin-bacitracin-polymyxin (NEOSPORIN) ointment Apply 1 application. topically as needed for wound care.     Polyethyl Glycol-Propyl Glycol (SYSTANE ULTRA) 0.4-0.3 % SOLN Apply 1 drop to eye 3 (three) times daily.     polyethylene glycol powder (GLYCOLAX/MIRALAX) powder Take 17 g by mouth 2 (two) times daily. Use for constipation, as prescribed, until daily soft stools  OTC 119 g 0   potassium chloride (KLOR-CON M) 10 MEQ tablet Take 2 tablets (20 mEq total) by mouth daily. 180 tablet 3   sertraline (ZOLOFT) 100 MG tablet Take 100 mg by mouth daily with breakfast.      tamsulosin (FLOMAX) 0.4 MG CAPS capsule Take 1 capsule (0.4 mg total) by mouth daily. 30 capsule 0   torsemide (DEMADEX) 20 MG tablet Take 2 tablets (40 mg total) by mouth daily. 180 tablet 1   traZODone (DESYREL) 100 MG tablet Take 100 mg by mouth at bedtime.     vitamin B-12 (CYANOCOBALAMIN) 1000 MCG tablet Take 1,000 mcg by mouth daily.     furosemide (LASIX) 40 MG tablet Take 1 tablet (40 mg total) by mouth daily. 90 tablet 3   No facility-administered medications prior to visit.     Review of Systems:   Constitutional: No weight loss or gain, night sweats, fevers, chills, fatigue, or lassitude. HEENT: No headaches, difficulty swallowing, tooth/dental problems, or sore throat. No sneezing, itching, ear ache, nasal congestion, or post nasal drip CV:  No chest pain, orthopnea, PND, swelling in lower extremities, anasarca, dizziness, palpitations, syncope Resp: +shortness of  breath with exertion. No excess mucus or change in color of mucus. No productive or non-productive. No hemoptysis. No wheezing.  No chest wall deformity Skin: No rash, lesions, ulcerations MSK:  No joint pain or swelling.  No back pain. +right sided weakness Neuro: No dizziness or lightheadedness.  Psych: No depression or anxiety. Mood stable.     Physical Exam:  BP 110/72 (BP Location: Left Arm, Cuff Size: Normal)   Pulse 78   Temp 98 F (36.7 C) (Oral)   Ht 5\' 7"  (1.702 m)   Wt  181 lb 9.6 oz (82.4 kg)   SpO2 98%   BMI 28.44 kg/m   GEN: Pleasant, interactive, chronically-ill appearing; in no acute distress. HEENT:  Normocephalic and atraumatic. PERRLA. Sclera white. Nasal turbinates pink, moist and patent bilaterally. No rhinorrhea present. Oropharynx pink and moist, without exudate or edema. No lesions, ulcerations, or postnasal drip.  NECK:  Supple w/ fair ROM. No JVD present. Normal carotid impulses w/o bruits. Thyroid symmetrical with no goiter or nodules palpated. No lymphadenopathy.   CV: RRR, no m/r/g, no peripheral edema. Pulses intact, +2 bilaterally. No cyanosis, pallor or clubbing. PULMONARY:  Unlabored, regular breathing. Clear bilaterally A&P w/o wheezes/rales/rhonchi. No accessory muscle use. No dullness to percussion. GI: BS present and normoactive. Soft, non-tender to palpation.  MSK: No erythema, warmth or tenderness. Cap refil <2 sec all extrem. Right hand contracture. Right hemiparesis.  Neuro: A/Ox3. Expressive aphasia.  Skin: Warm, no lesions or rashe Psych: Normal affect and behavior. Judgement and thought content appropriate.     Lab Results:  CBC    Component Value Date/Time   WBC 10.0 07/24/2021 1113   WBC 17.2 (H) 08/15/2019 0545   RBC 5.17 07/24/2021 1113   RBC 4.76 08/15/2019 0545   HGB 14.7 07/24/2021 1113   HCT 43.7 07/24/2021 1113   PLT 204 07/24/2021 1113   MCV 85 07/24/2021 1113   MCH 28.4 07/24/2021 1113   MCH 28.6 08/15/2019 0545    MCHC 33.6 07/24/2021 1113   MCHC 32.7 08/15/2019 0545   RDW 13.5 07/24/2021 1113   LYMPHSABS 1.8 07/24/2021 1113   MONOABS 0.6 08/13/2019 1447   EOSABS 0.2 07/24/2021 1113   BASOSABS 0.1 07/24/2021 1113    BMET    Component Value Date/Time   NA 140 11/19/2021 1615   K 3.9 11/19/2021 1615   CL 96 11/19/2021 1615   CO2 29 11/19/2021 1615   GLUCOSE 98 11/19/2021 1615   GLUCOSE 137 (H) 08/15/2019 0545   BUN 13 11/19/2021 1615   CREATININE 1.03 11/19/2021 1615   CALCIUM 9.2 11/19/2021 1615   GFRNONAA >60 08/15/2019 0545   GFRAA >60 08/15/2019 0545    BNP    Component Value Date/Time   BNP 266.7 (H) 08/13/2019 1447     Imaging:  No results found.       Latest Ref Rng & Units 12/24/2021   10:55 AM  PFT Results  FVC-Pre L 2.84  P  FVC-Predicted Pre % 70  P  FVC-Post L 2.77  P  FVC-Predicted Post % 69  P  Pre FEV1/FVC % % 62  P  Post FEV1/FCV % % 62  P  FEV1-Pre L 1.75  P  FEV1-Predicted Pre % 59  P  FEV1-Post L 1.72  P  DLCO uncorrected ml/min/mmHg 8.89  P  DLCO UNC% % 37  P  DLCO corrected ml/min/mmHg 8.89  P  DLCO COR %Predicted % 37  P  DLVA Predicted % 51  P  TLC L 4.27  P  TLC % Predicted % 66  P  RV % Predicted % 59  P    P Preliminary result    No results found for: "NITRICOXIDE"      Assessment & Plan:   COPD, severe (HCC) Moderately severe COPD with advanced, 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 High symptom burden, multifactorial. Discussed trial of step up to triple therapy with Markus Daft - provided with samples today. Advised to notify if he feels breathing improves so we can send rx. Continue PRN albuterol.  Patient Instructions  Continue Albuterol inhaler 2 puffs or 3 mL neb every 6 hours as needed for shortness of breath or wheezing. Notify if symptoms persist despite rescue inhaler/neb use.  Stop Bevespi. Trial Breztri 2 puffs Twice daily. Brush tongue and rinse mouth afterwards. If no significant change in your breathing, you can go back  to using Bevespi.  Continue torsemide 2 tab daily as directed by cardiology  Continue supplemental oxygen 3-4 lpm to maintain oxygen levels greater 88-90%   Labs today    Attend pulmonary rehab    Follow up in 4 weeks with Dr. Francine Gravenewald. If symptoms do not improve or worsen, please contact office for sooner follow up or seek emergency care.     ILD (interstitial lung disease) (HCC) Imaging changes and restrictive pattern on PFTs. 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  No frank honeycombing or reticulation noted. 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 We will check hypersensitivity panel today for further evaluation. 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   Chronic respiratory failure with hypoxia (HCC) Suffered from pneumonia in 2017 and has been on supplemental oxygen since. Currently stable without increased O2 requirements. Continue supplemental oxygen 2-4 lpm for goal >88-90%.     I spent 38 minutes of dedicated to the care of this patient on the date of this encounter to include pre-visit review of records, face-to-face time with the patient discussing conditions above, post visit ordering of testing, clinical documentation with the electronic health record, making appropriate referrals as documented, and communicating necessary findings to members of the patients care team.  Noemi ChapelKatherine V Yatzil Clippinger, NP 12/24/2021  Pt aware and understands NP's role.

## 2021-12-24 NOTE — Assessment & Plan Note (Signed)
Suffered from pneumonia in 2017 and has been on supplemental oxygen since. Currently stable without increased O2 requirements. Continue supplemental oxygen 2-4 lpm for goal >88-90%.

## 2021-12-24 NOTE — Patient Instructions (Signed)
Continue Albuterol inhaler 2 puffs or 3 mL neb every 6 hours as needed for shortness of breath or wheezing. Notify if symptoms persist despite rescue inhaler/neb use.  Stop Bevespi. Trial Breztri 2 puffs Twice daily. Brush tongue and rinse mouth afterwards. If no significant change in your breathing, you can go back to using Bevespi.  Continue torsemide 2 tab daily as directed by cardiology  Continue supplemental oxygen 3-4 lpm to maintain oxygen levels greater 88-90%   Labs today    Attend pulmonary rehab    Follow up in 4 weeks with Dr. Francine Graven. If symptoms do not improve or worsen, please contact office for sooner follow up or seek emergency care.

## 2021-12-24 NOTE — Progress Notes (Signed)
PFT done today. 

## 2021-12-24 NOTE — Assessment & Plan Note (Addendum)
Imaging changes and restrictive pattern on PFTs. 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  No frank honeycombing or reticulation noted. 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 We will check hypersensitivity panel today for further evaluation. 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

## 2021-12-26 ENCOUNTER — Telehealth: Payer: Self-pay | Admitting: Nurse Practitioner

## 2021-12-26 NOTE — Telephone Encounter (Signed)
Called and spoke with pt's spouse Darl Pikes letting her know the info per [facility] and she verbalized understanding. Nothing further needed.

## 2021-12-26 NOTE — Telephone Encounter (Signed)
Katie, please advise on results of pt's recent labwork as pt's spouse is calling checking on the results.

## 2021-12-26 NOTE — Telephone Encounter (Signed)
I have not gotten all of his labs back yet. His IgE is elevated, which is seen with allergies. His WBC count is slightly elevated but no infectious symptoms at OV. We will recheck CBC in 4 weeks unless he develops fevers, productive cough, or malaise. Still awaiting a few of the panels. We will notify them next week once they are back. Thanks.

## 2021-12-30 ENCOUNTER — Encounter (HOSPITAL_COMMUNITY)
Admission: RE | Admit: 2021-12-30 | Discharge: 2021-12-30 | Disposition: A | Discharge status: Home / Self Care | Payer: Medicare HMO | Source: Ambulatory Visit | Attending: Pulmonary Disease | Admitting: Pulmonary Disease

## 2021-12-30 DIAGNOSIS — J9611 Chronic respiratory failure with hypoxia: Secondary | ICD-10-CM

## 2021-12-30 NOTE — Progress Notes (Signed)
Daily Session Note  Patient Details  Name: Daniel Oconnell MRN: [id number] Date of Birth: 1955/12/21 Referring Provider:   [name] Pulmonary Rehab Walk Test from 12/22/2021 in [place]  Referring Provider [name]       Encounter Date: 12/30/2021  Check In:  Session Check In - 12/30/21 1457       Check-In   Supervising physician immediately available to respond to emergencies [facility] immediately available    Physician(s) Dr. Posey Pronto    Location [facility]    Staff Present Ramon Dredge, RN, MHA;Lisa Clarisa Schools, MS, ACSM-CEP, Exercise Physiologist    Virtual Visit No    Medication changes reported     No    Fall or balance concerns reported    No    Tobacco Cessation No Change    Warm-up and Cool-down Performed as group-led instruction    Resistance Training Performed Yes    VAD Patient? No    PAD/SET Patient? No      Pain Assessment   Currently in Pain? No/denies    Multiple Pain Sites No             Capillary Blood Glucose: No results found for this or any previous visit (from the past 24 hour(s)).    Social History   Tobacco Use  Smoking Status Former   Packs/day: 1.00   Years: 28.00   Total pack years: 28.00   Types: Cigarettes   Quit date: 2013   Years since quitting: 10.6  Smokeless Tobacco Never    Goals Met:  Exercise tolerated well No report of concerns or symptoms today Strength training completed today  Goals Unmet:  Not Applicable  Comments: Service time is from 1310 to 1443.    Dr. Rodman Pickle is Medical Director for Pulmonary Rehab at [facility].

## 2021-12-31 LAB — IGE: IgE (Immunoglobulin E), Serum: 292 kU/L — ABNORMAL HIGH (ref <=114)

## 2021-12-31 LAB — ANCA SCREEN W REFLEX TITER: ANCA SCREEN: NEGATIVE

## 2022-01-01 ENCOUNTER — Encounter (HOSPITAL_COMMUNITY)
Admission: RE | Admit: 2022-01-01 | Discharge: 2022-01-01 | Disposition: A | Discharge status: Home / Self Care | Payer: Medicare HMO | Source: Ambulatory Visit | Attending: Pulmonary Disease | Admitting: Pulmonary Disease

## 2022-01-01 DIAGNOSIS — J9611 Chronic respiratory failure with hypoxia: Secondary | ICD-10-CM | POA: Diagnosis not present

## 2022-01-01 NOTE — Progress Notes (Signed)
Daily Session Note  Patient Details  Name: Daniel Oconnell MRN: [id number] Date of Birth: January 30, 1956 Referring Provider:   [name] Pulmonary Rehab Walk Test from 12/22/2021 in [place]  Referring Provider [name]       Encounter Date: 01/01/2022  Check In:  Session Check In - 01/01/22 1427       Check-In   Supervising physician immediately available to respond to emergencies [facility] immediately available    Physician(s) Dr. Lonny Prude    Location [facility]    Staff Present Rosebud Poles, RN, BSN;Carlette Wilber Oliphant, RN, Quentin Ore, MS, ACSM-CEP, Exercise Physiologist;Paarth Cropper Ysidro Evert, RN    Virtual Visit No    Medication changes reported     No    Fall or balance concerns reported    No    Tobacco Cessation No Change    Warm-up and Cool-down Performed as group-led instruction    Resistance Training Performed Yes    VAD Patient? No    PAD/SET Patient? No      Pain Assessment   Currently in Pain? No/denies    Multiple Pain Sites No             Capillary Blood Glucose: No results found for this or any previous visit (from the past 24 hour(s)).    Social History   Tobacco Use  Smoking Status Former   Packs/day: 1.00   Years: 28.00   Total pack years: 28.00   Types: Cigarettes   Quit date: 2013   Years since quitting: 10.6  Smokeless Tobacco Never    Goals Met:  Proper associated with RPD/PD & O2 Sat No report of concerns or symptoms today Strength training completed today  Goals Unmet:  Not Applicable  Comments: Service time is from 1310 to [place]    Dr. Rodman Pickle is Medical Director for Pulmonary Rehab at [facility].

## 2022-01-06 ENCOUNTER — Encounter (HOSPITAL_COMMUNITY)
Admission: RE | Admit: 2022-01-06 | Discharge: 2022-01-06 | Disposition: A | Discharge status: Home / Self Care | Payer: Medicare HMO | Source: Ambulatory Visit | Attending: Pulmonary Disease | Admitting: Pulmonary Disease

## 2022-01-06 VITALS — Wt 184.7 lb

## 2022-01-06 DIAGNOSIS — J9611 Chronic respiratory failure with hypoxia: Secondary | ICD-10-CM | POA: Diagnosis not present

## 2022-01-06 NOTE — Progress Notes (Signed)
Daily Session Note  Patient Details  Name: Daniel Oconnell MRN: [id number] Date of Birth: 1955/06/12 Referring Provider:   [name] Pulmonary Rehab Walk Test from 12/22/2021 in [place]  Referring Provider [name]       Encounter Date: 01/06/2022  Check In:  Session Check In - 01/06/22 1514       Check-In   Supervising physician immediately available to respond to emergencies [facility] immediately available    Physician(s) Dr. Lonny Prude    [facility]    Staff Present Rosebud Poles, RN, BSN;Lisa Ysidro Evert, Cathleen Fears, MS, ACSM-CEP, Exercise Physiologist;Randi Yevonne Pax, ACSM-CEP, Exercise Physiologist    Virtual Visit No    Medication changes reported     No    Fall or balance concerns reported    No    Tobacco Cessation No Change    Warm-up and Cool-down Performed as group-led instruction    Resistance Training Performed Yes    VAD Patient? No    PAD/SET Patient? No      Pain Assessment   Currently in Pain? No/denies    Multiple Pain Sites No             Capillary Blood Glucose: No results found for this or any previous visit (from the past 24 hour(s)).   Exercise Prescription Changes - 01/06/22 1500       Response to Exercise   Blood Pressure (Admit) 124/64    Blood Pressure (Exercise) 132/60    Blood Pressure (Exit) 130/66    Heart Rate (Admit) 94 bpm    Heart Rate (Exercise) 92 bpm    Heart Rate (Exit) 76 bpm    Oxygen Saturation (Admit) 92 %    Oxygen Saturation (Exercise) 92 %    Oxygen Saturation (Exit) 96 %    Rating of Perceived Exertion (Exercise) 15    Perceived Dyspnea (Exercise) 2    Duration Continue with 30 min of aerobic exercise without signs/symptoms of physical distress.    Intensity Other (comment)   40-80% HRR     Resistance Training   Training Prescription Yes    Weight yellow bands    Reps 10-15    Time 10 Minutes      Oxygen   Oxygen Continuous    Liters 3       Recumbant Bike   Level 1.5    Minutes 15    METs 1.5      NuStep   Level 2    SPM 80    Minutes 15    METs 1.6             Social History   Tobacco Use  Smoking Status Former   Packs/day: 1.00   Years: 28.00   Total pack years: 28.00   Types: Cigarettes   Quit date: 2013   Years since quitting: 10.6  Smokeless Tobacco Never    Goals Met:  Proper associated with RPD/PD & O2 Sat Exercise tolerated well No report of concerns or symptoms today Strength training completed today  Goals Unmet:  Not Applicable  Comments: Service time is from 1304  to [place]    Dr. Rodman Pickle is Medical Director for Pulmonary Rehab at [facility].

## 2022-01-07 NOTE — Progress Notes (Signed)
Please follow up on hypersensitivity panel with lab. It was collected on 8/9 an still do not have results. Thanks.

## 2022-01-08 ENCOUNTER — Encounter (HOSPITAL_COMMUNITY)
Admission: RE | Admit: 2022-01-08 | Discharge: 2022-01-08 | Disposition: A | Discharge status: Home / Self Care | Payer: Medicare HMO | Source: Ambulatory Visit | Attending: Pulmonary Disease | Admitting: Pulmonary Disease

## 2022-01-08 DIAGNOSIS — J9611 Chronic respiratory failure with hypoxia: Secondary | ICD-10-CM | POA: Diagnosis not present

## 2022-01-08 NOTE — Progress Notes (Signed)
Daily Session Note  Patient Details  Name: Daniel Oconnell MRN: [id number] Date of Birth: [birth year] Referring Provider:   [name] Pulmonary Rehab Walk Test from 12/22/2021 in [place]  Referring Provider [name]       Encounter Date: 01/08/2022  Check In:  Session Check In - 01/08/22 1513       Check-In   Supervising physician immediately available to respond to emergencies [facility] immediately available    Physician(s) dr. Florene Glen    [facility]    Staff Present Rosebud Poles, RN, Quentin Ore, MS, ACSM-CEP, Exercise Physiologist;Randi Yevonne Pax, ACSM-CEP, Exercise Physiologist;Carlette Wilber Oliphant, RN, Roque Cash, RN    Virtual Visit No    Medication changes reported     No    Fall or balance concerns reported    No    Tobacco Cessation No Change    Warm-up and Cool-down Performed as group-led instruction    Resistance Training Performed Yes    VAD Patient? No    PAD/SET Patient? No      Pain Assessment   Currently in Pain? No/denies    Multiple Pain Sites No             Capillary Blood Glucose: No results found for this or any previous visit (from the past 24 hour(s)).    Social History   Tobacco Use  Smoking Status Former   Packs/day: 1.00   Years: 28.00   Total pack years: 28.00   Types: Cigarettes   Quit date: 2013   Years since quitting: 10.6  Smokeless Tobacco Never    Goals Met:  Proper associated with RPD/PD & O2 Sat Exercise tolerated well No report of concerns or symptoms today Strength training completed today  Goals Unmet:  Not Applicable  Comments: Service time is from 1313 to 1440.    Dr. Rodman Pickle is Medical Director for Pulmonary Rehab at [facility].

## 2022-01-10 LAB — HYPERSENSITIVITY PNEUMONITIS
A. Pullulans Abs: NEGATIVE
A.Fumigatus #1 Abs: NEGATIVE
Micropolyspora faeni, IgG: NEGATIVE
Pigeon Serum Abs: NEGATIVE
Thermoact. Saccharii: NEGATIVE
Thermoactinomyces vulgaris, IgG: NEGATIVE

## 2022-01-13 ENCOUNTER — Encounter (HOSPITAL_COMMUNITY)
Admission: RE | Admit: 2022-01-13 | Discharge: 2022-01-13 | Disposition: A | Discharge status: Home / Self Care | Payer: Medicare HMO | Source: Ambulatory Visit | Attending: Pulmonary Disease | Admitting: Pulmonary Disease

## 2022-01-13 ENCOUNTER — Telehealth: Payer: Self-pay | Admitting: Pulmonary Disease

## 2022-01-13 DIAGNOSIS — J9611 Chronic respiratory failure with hypoxia: Secondary | ICD-10-CM

## 2022-01-13 NOTE — Progress Notes (Signed)
Daily Session Note  Patient Details  Name: Daniel Oconnell MRN: [id number] Date of Birth: 03-Oct-1955 Referring Provider:   [name] Pulmonary Rehab Walk Test from 12/22/2021 in [place]  Referring Provider [name]       Encounter Date: 01/13/2022  Check In:  Session Check In - 01/13/22 1423       Check-In   Supervising physician immediately available to respond to emergencies [facility] immediately available    Physician(s) Dr. Florene Glen    [facility]    Staff Present Rosebud Poles, RN, BSN;Randi Olen Cordial BS, ACSM-CEP, Exercise Physiologist;Kaylee Rosana Hoes, MS, ACSM-CEP, Exercise Physiologist;Lisa Ysidro Evert, RN    Virtual Visit No    Medication changes reported     No    Fall or balance concerns reported    No    Tobacco Cessation No Change    Warm-up and Cool-down Performed as group-led instruction    Resistance Training Performed Yes    VAD Patient? No    PAD/SET Patient? No      Pain Assessment   Currently in Pain? No/denies    Multiple Pain Sites No             Capillary Blood Glucose: No results found for this or any previous visit (from the past 24 hour(s)).    Social History   Tobacco Use  Smoking Status Former   Packs/day: 1.00   Years: 28.00   Total pack years: 28.00   Types: Cigarettes   Quit date: 2013   Years since quitting: 10.6  Smokeless Tobacco Never    Goals Met:  Proper associated with RPD/PD & O2 Sat Exercise tolerated well No report of concerns or symptoms today Strength training completed today  Goals Unmet:  Not Applicable  Comments: Service time is from 1306 to 1440.    Dr. Rodman Pickle is Medical Director for Pulmonary Rehab at [facility].

## 2022-01-15 ENCOUNTER — Encounter (HOSPITAL_COMMUNITY)
Admission: RE | Admit: 2022-01-15 | Discharge: 2022-01-15 | Disposition: A | Discharge status: Home / Self Care | Payer: Medicare HMO | Source: Ambulatory Visit | Attending: Pulmonary Disease | Admitting: Pulmonary Disease

## 2022-01-15 DIAGNOSIS — J9611 Chronic respiratory failure with hypoxia: Secondary | ICD-10-CM

## 2022-01-15 NOTE — Progress Notes (Signed)
Thanks for checking, [place]. It finally came through the other day. Yes, please notify them that aside from mildly elevated IgE and WBC, labs were unremarkable. We will recheck his WBC count at his follow up. Can discuss next steps with Dr. Francine Graven next week. Thanks!

## 2022-01-15 NOTE — Progress Notes (Signed)
Daily Session Note  Patient Details  Name: Daniel Oconnell MRN: [id number] Date of Birth: 1956-04-13 Referring Provider:   [name] Pulmonary Rehab Walk Test from 12/22/2021 in [place]  Referring Provider [name]       Encounter Date: 01/15/2022  Check In:  Session Check In - 01/15/22 1419       Check-In   Supervising physician immediately available to respond to emergencies [facility] immediately available    Physician(s) Dr. Verlon Au    Location [facility]    Staff Present Rosebud Poles, RN, BSN;Randi Olen Cordial BS, ACSM-CEP, Exercise Physiologist;Kaylee Rosana Hoes, MS, ACSM-CEP, Exercise Physiologist;Lisa Ysidro Evert, RN    Virtual Visit No    Medication changes reported     No    Fall or balance concerns reported    No    Tobacco Cessation No Change    Warm-up and Cool-down Performed as group-led instruction    Resistance Training Performed Yes    VAD Patient? No    PAD/SET Patient? No      Pain Assessment   Currently in Pain? No/denies    Multiple Pain Sites No             Capillary Blood Glucose: No results found for this or any previous visit (from the past 24 hour(s)).    Social History   Tobacco Use  Smoking Status Former   Packs/day: 1.00   Years: 28.00   Total pack years: 28.00   Types: Cigarettes   Quit date: 2013   Years since quitting: 10.6  Smokeless Tobacco Never    Goals Met:  Proper associated with RPD/PD & O2 Sat Exercise tolerated well No report of concerns or symptoms today Strength training completed today  Goals Unmet:  Not Applicable  Comments: Service time is from 1311 to 1440.    Dr. Rodman Pickle is Medical Director for Pulmonary Rehab at [facility].

## 2022-01-16 NOTE — Telephone Encounter (Signed)
Daniel Oconnell wife states patient would like to stay on the [place]. Does not want to change medication. Daniel Oconnell phone number is [phone].

## 2022-01-16 NOTE — Telephone Encounter (Signed)
Called and spoke with pt's spouse Darl Pikes who states that pt does want to stay on [facility] as it has been helping pt and said when they got the Rx from the pharmacy, it was for a 26-month supply. Markus Daft is now on pt's med list. Nothing further needed.

## 2022-01-20 ENCOUNTER — Encounter (HOSPITAL_COMMUNITY): Payer: Medicare HMO

## 2022-01-21 ENCOUNTER — Ambulatory Visit: Payer: Medicare HMO | Admitting: Pulmonary Disease

## 2022-01-21 NOTE — Progress Notes (Signed)
Pulmonary Individual Treatment Plan  Patient Details  Name: Daniel Oconnell MRN: [id number] Date of Birth: [birth year] Referring Provider:   [name] Pulmonary Rehab Walk Test from 12/22/2021 in [place]  Referring Provider [name]       Initial Encounter Date:  Flowsheet Row Pulmonary Rehab Walk Test from 12/22/2021 in [place]  Date 12/22/21       Visit Diagnosis: Chronic respiratory failure with hypoxia ([place])  Patient's Home Medications on Admission:   Current Outpatient Medications:    acetaminophen (TYLENOL) 500 MG tablet, Take 500 mg by mouth every 4 (four) hours as needed for mild pain, moderate pain, fever or headache., Disp: , Rfl:    albuterol (PROVENTIL) (2.5 MG/3ML) 0.083% nebulizer solution, Take 3 mLs (2.5 mg total) by nebulization every 4 (four) hours as needed for wheezing or shortness of breath., Disp: 75 mL, Rfl: 0   albuterol (VENTOLIN HFA) 108 (90 Base) MCG/ACT inhaler, Inhale 1-2 puffs into the lungs every 6 (six) hours as needed for wheezing or shortness of breath., Disp: 8 g, Rfl: 6   alum & mag hydroxide-simeth (MAALOX/MYLANTA) 200-200-20 MG/5ML suspension, Take 30 mLs by mouth as needed for indigestion or heartburn., Disp: , Rfl:    ASPIRIN 81 PO, Take 81 mg by mouth daily., Disp: , Rfl:    atorvastatin (LIPITOR) 80 MG tablet, Take 80 mg by mouth at bedtime. , Disp: , Rfl:    baclofen (LIORESAL) 20 MG tablet, Take 20 mg by mouth 3 (three) times daily., Disp: , Rfl:    BREZTRI AEROSPHERE 160-9-4.8 MCG/ACT AERO, Inhale into the lungs., Disp: , Rfl:    clopidogrel (PLAVIX) 75 MG tablet, Take 75 mg by mouth daily with breakfast. , Disp: , Rfl:    dicyclomine (BENTYL) 10 MG capsule, Take 10 mg by mouth daily., Disp: , Rfl:    docusate sodium (COLACE) 100 MG capsule, Take 100 mg by mouth 2 (two) times daily., Disp: , Rfl:    furosemide (LASIX) 40 MG tablet, Take 1 tablet (40 mg total) by mouth daily.,  Disp: 90 tablet, Rfl: 3   guaifenesin (ROBITUSSIN) 100 MG/5ML syrup, Take 200 mg by mouth every 6 (six) hours as needed for cough., Disp: , Rfl:    hydrALAZINE (APRESOLINE) 100 MG tablet, Take 1 tablet (100 mg total) by mouth 3 (three) times daily., Disp: 270 tablet, Rfl: 3   levETIRAcetam (KEPPRA) 750 MG tablet, Take 750 mg by mouth 2 (two) times daily., Disp: , Rfl:    levothyroxine (SYNTHROID, LEVOTHROID) 25 MCG tablet, Take 1 tablet by mouth daily., Disp: , Rfl:    loperamide (IMODIUM) 2 MG capsule, Take by mouth as needed for diarrhea or loose stools., Disp: , Rfl:    magnesium hydroxide (MILK OF MAGNESIA) 400 MG/5ML suspension, Take 30 mLs by mouth at bedtime as needed for mild constipation., Disp: , Rfl:    melatonin 3 MG TABS tablet, Take 3 mg by mouth at bedtime., Disp: , Rfl:    mometasone (NASONEX) 50 MCG/ACT nasal spray, SMARTSIG:Both Nares, Disp: , Rfl:    neomycin-bacitracin-polymyxin (NEOSPORIN) ointment, Apply 1 application. topically as needed for wound care., Disp: , Rfl:    Polyethyl Glycol-Propyl Glycol (SYSTANE ULTRA) 0.4-0.3 % SOLN, Apply 1 drop to eye 3 (three) times daily., Disp: , Rfl:    polyethylene glycol powder (GLYCOLAX/MIRALAX) powder, Take 17 g by mouth 2 (two) times daily. Use for constipation, as prescribed, until daily soft stools  OTC, Disp: 119  g, Rfl: 0   potassium chloride (KLOR-CON M) 10 MEQ tablet, Take 2 tablets (20 mEq total) by mouth daily., Disp: 180 tablet, Rfl: 3   sertraline (ZOLOFT) 100 MG tablet, Take 100 mg by mouth daily with breakfast. 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)    CAD (coronary artery disease) 2001   CABG   COPD (chronic obstructive pulmonary disease) ([place])    CVA (cerebral vascular accident) ([place]) 2001, 2013   x2   Expressive aphasia    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    Hyperlipidemia    Hypertension     Tobacco Use: Social History   Tobacco Use  Smoking Status Former   Packs/day: 1.00   Years: 28.00   Total pack years: 28.00   Types: Cigarettes   Quit date: 2013   Years since quitting: 10.6  Smokeless Tobacco Never    Labs: Review Flowsheet       Latest Ref Rng & Units 04/16/2016 03/10/2017 03/05/2020 07/24/2021  Labs for ITP Cardiac and Pulmonary Rehab  Cholestrol 100 - 199 mg/dL - 109  111  112   LDL (calc) 0 - 99 mg/dL - 47  52  59   HDL-C >39 mg/dL - 35  35  38   Trlycerides 0 - 149 mg/dL - 135  139  73   PH, Arterial 7.350 - 7.450 7.369  - - -  PCO2 arterial 32.0 - 48.0 mmHg 42.7  - - -  Bicarbonate 20.0 - 28.0 mmol/L 24.0  - - -  Acid-base deficit 0.0 - 2.0 mmol/L 0.9  - - -  O2 Saturation % 88.4  - - -    Capillary Blood Glucose: Lab Results  Component Value Date   GLUCAP 90 02/04/2019     Pulmonary Assessment Scores:  Pulmonary Assessment Scores     Row Name 12/22/21 1109         ADL UCSD   ADL Phase Entry     SOB Score total 106       CAT Score   CAT Score 30       mMRC Score   mMRC Score 4             UCSD: Self-administered rating of dyspnea associated with activities of daily living (ADLs) 6-point scale (0 = "not at all" to 5 = "maximal or unable to do because of breathlessness")  Scoring Scores range from 0 to 120.  Minimally important difference is 5 units  CAT: CAT can identify the health impairment of COPD patients and is better correlated with disease progression.  CAT has a scoring range of zero to 40. The CAT score is classified  into four groups of low (less than 10), medium (10 - 20), high (21-30) and very high (31-40) based on the impact level of disease on health status. A CAT score over 10 suggests significant symptoms.  A worsening CAT  score could be explained by an exacerbation, poor medication adherence, poor inhaler technique, or progression of COPD or comorbid conditions.  CAT MCID is 2 points  mMRC: mMRC (Modified Medical Research Council) Dyspnea Scale is used to assess the degree of baseline functional disability in patients of respiratory disease due to dyspnea. No minimal important difference is established. A decrease in score of 1 point or greater is considered a positive change.   Pulmonary Function Assessment:  Pulmonary Function Assessment - 12/22/21 1137       Breath   Bilateral Breath Sounds Clear;Decreased    Shortness of Breath Yes;Fear of Shortness of Breath;Limiting activity;Panic with Shortness of Breath             Exercise Target Goals: Exercise Program Goal: Individual exercise prescription set using results from initial 6 min walk test and THRR while considering  patient's activity barriers and safety.   Exercise Prescription Goal: Initial exercise prescription builds to 30-45 minutes a day of aerobic activity, 2-3 days per week.  Home exercise guidelines will be given to patient during program as part of exercise prescription that the participant will acknowledge.  Activity Barriers & Risk Stratification:  Activity Barriers & Cardiac Risk Stratification - 12/22/21 1126       Activity Barriers & Cardiac Risk Stratification   Activity Barriers Arthritis;Back Problems;Neck/Spine Problems;History of Falls;Muscular Weakness;Deconditioning;Shortness of Breath   CVA in 2001, 2013            6 Minute Walk:  6 Minute Walk     Row Name 12/22/21 1238         6 Minute Walk   Phase Initial     Distance 180 feet     Walk Time 6 minutes     # of Rest Breaks 3  0:34-1:132,  2:39-5:00, 5:30-6:00     MPH 0.34     METS 1.18     RPE 19     Perceived Dyspnea  4     VO2 Peak 4.12     Symptoms No     Resting HR 69 bpm     Resting BP 122/66     Resting Oxygen Saturation  90 %     Exercise Oxygen Saturation  during 6 min walk 91 %     Max Ex. HR 85 bpm     Max Ex. BP 156/78     2 Minute Post BP 130/76       Interval HR   1 Minute HR 76     2 Minute HR 78     3 Minute HR 73     4 Minute HR 62     5 Minute HR 73     6 Minute HR 85     2 Minute Post HR 62     Interval Heart Rate? Yes       Interval Oxygen   Interval Oxygen? Yes     Baseline Oxygen Saturation % 90 %     1 Minute Oxygen Saturation % 94 %     1 Minute Liters of Oxygen 3 L     2 Minute Oxygen Saturation % 92 %     2 Minute Liters of Oxygen 3 L     3 Minute Oxygen Saturation % 93 %     3 Minute Liters of Oxygen 3 L     4 Minute Oxygen Saturation % 95 %     4 Minute Liters of Oxygen 3 L  5 Minute Oxygen Saturation % 97 %     5 Minute Liters of Oxygen 3 L     6 Minute Oxygen Saturation % 91 %     6 Minute Liters of Oxygen 3 L     2 Minute Post Oxygen Saturation % 96 %     2 Minute Post Liters of Oxygen 3 L              Oxygen Initial Assessment:  Oxygen Initial Assessment - 12/22/21 1122       Home Oxygen   Home Oxygen Device Portable Concentrator;Home Concentrator    Sleep Oxygen Prescription Continuous    Liters per minute 3    Home Exercise Oxygen Prescription Pulsed    Liters per minute 4    Home Resting Oxygen Prescription Continuous    Liters per minute 3    Compliance with Home Oxygen Use Yes      Initial 6 min Walk   Oxygen Used Continuous    Liters per minute 3      Program Oxygen Prescription   Program Oxygen Prescription Continuous    Liters per minute 3      Intervention   Short Term Goals To learn and exhibit compliance with exercise, home and travel O2 prescription;To learn and understand importance of monitoring SPO2 with pulse oximeter and  demonstrate accurate use of the pulse oximeter.;To learn and understand importance of maintaining oxygen saturations>88%;To learn and demonstrate proper pursed lip breathing techniques or other breathing techniques. ;To learn and demonstrate proper use of respiratory medications    Long  Term Goals Exhibits compliance with exercise, home  and travel O2 prescription;Verbalizes importance of monitoring SPO2 with pulse oximeter and return demonstration;Maintenance of O2 saturations>88%;Exhibits proper breathing techniques, such as pursed lip breathing or other method taught during program session;Compliance with respiratory medication;Demonstrates proper use of MDI's             Oxygen Re-Evaluation:  Oxygen Re-Evaluation     Row Name 12/23/21 0912 01/14/22 1156           Program Oxygen Prescription   Program Oxygen Prescription Continuous Continuous      Liters per minute 3 3        Home Oxygen   Home Oxygen Device Portable Concentrator;Home Concentrator Portable Concentrator;Home Concentrator      Sleep Oxygen Prescription Continuous Continuous      Liters per minute 3 3      Home Exercise Oxygen Prescription Pulsed Pulsed      Liters per minute 4 4      Home Resting Oxygen Prescription Continuous Continuous      Liters per minute 3 3      Compliance with Home Oxygen Use Yes Yes        Goals/Expected Outcomes   Short Term Goals To learn and exhibit compliance with exercise, home and travel O2 prescription;To learn and understand importance of monitoring SPO2 with pulse oximeter and demonstrate accurate use of the pulse oximeter.;To learn and understand importance of maintaining oxygen saturations>88%;To learn and demonstrate proper pursed lip breathing techniques or other breathing techniques. ;To learn and demonstrate proper use of respiratory medications To learn and exhibit compliance with exercise, home and travel O2 prescription;To learn and understand importance of monitoring  SPO2 with pulse oximeter and demonstrate accurate use of the pulse oximeter.;To learn and understand importance of maintaining oxygen saturations>88%;To learn and demonstrate proper pursed lip breathing techniques or other breathing techniques. ;To learn and demonstrate  proper use of respiratory medications      Long  Term Goals Exhibits compliance with exercise, home  and travel O2 prescription;Verbalizes importance of monitoring SPO2 with pulse oximeter and return demonstration;Maintenance of O2 saturations>88%;Exhibits proper breathing techniques, such as pursed lip breathing or other method taught during program session;Compliance with respiratory medication;Demonstrates proper use of MDI's Exhibits compliance with exercise, home  and travel O2 prescription;Verbalizes importance of monitoring SPO2 with pulse oximeter and return demonstration;Maintenance of O2 saturations>88%;Exhibits proper breathing techniques, such as pursed lip breathing or other method taught during program session;Compliance with respiratory medication;Demonstrates proper use of MDI's      Goals/Expected Outcomes Compliance and understanding of oxygen saturation monitoring and breathing techniques to decrease shortness of breath. Compliance and understanding of oxygen saturation monitoring and breathing techniques to decrease shortness of breath.               Oxygen Discharge (Final Oxygen Re-Evaluation):  Oxygen Re-Evaluation - 01/14/22 1156       Program Oxygen Prescription   Program Oxygen Prescription Continuous    Liters per minute 3      Home Oxygen   Home Oxygen Device Portable Concentrator;Home Concentrator    Sleep Oxygen Prescription Continuous    Liters per minute 3    Home Exercise Oxygen Prescription Pulsed    Liters per minute 4    Home Resting Oxygen Prescription Continuous    Liters per minute 3    Compliance with Home Oxygen Use Yes      Goals/Expected Outcomes   Short Term Goals To learn and  exhibit compliance with exercise, home and travel O2 prescription;To learn and understand importance of monitoring SPO2 with pulse oximeter and demonstrate accurate use of the pulse oximeter.;To learn and understand importance of maintaining oxygen saturations>88%;To learn and demonstrate proper pursed lip breathing techniques or other breathing techniques. ;To learn and demonstrate proper use of respiratory medications    Long  Term Goals Exhibits compliance with exercise, home  and travel O2 prescription;Verbalizes importance of monitoring SPO2 with pulse oximeter and return demonstration;Maintenance of O2 saturations>88%;Exhibits proper breathing techniques, such as pursed lip breathing or other method taught during program session;Compliance with respiratory medication;Demonstrates proper use of MDI's    Goals/Expected Outcomes Compliance and understanding of oxygen saturation monitoring and breathing techniques to decrease shortness of breath.             Initial Exercise Prescription:  Initial Exercise Prescription - 12/22/21 1200       Date of Initial Exercise RX and Referring Provider   Date 12/22/21    Referring Provider [name]    Expected Discharge Date 02/26/22      Oxygen   Oxygen Continuous    Liters 3    Maintain Oxygen Saturation 88% or higher      Recumbant Bike   Level 1    RPM 20    Minutes 25      Prescription Details   Frequency (times per week) 2    Duration Progress to 30 minutes of continuous aerobic without signs/symptoms of physical distress      Intensity   THRR 40-80% of Max Heartrate 61-122    Ratings of Perceived Exertion 11-13    Perceived Dyspnea 0-4      Progression   Progression Continue to progress workloads to maintain intensity without signs/symptoms of physical distress.      Resistance Training   Training Prescription Yes    Weight yellow bands    Reps 10-15  Perform Capillary Blood Glucose checks as  needed.  Exercise Prescription Changes:   Exercise Prescription Changes     Row Name 01/06/22 1500 01/15/22 1519           Response to Exercise   Blood Pressure (Admit) 124/64 124/60      Blood Pressure (Exercise) 132/60 --      Blood Pressure (Exit) 130/66 112/54      Heart Rate (Admit) 94 bpm 83 bpm      Heart Rate (Exercise) 92 bpm 91 bpm      Heart Rate (Exit) 76 bpm 77 bpm      Oxygen Saturation (Admit) 92 % 95 %      Oxygen Saturation (Exercise) 92 % 94 %      Oxygen Saturation (Exit) 96 % 98 %      Rating of Perceived Exertion (Exercise) 15 13      Perceived Dyspnea (Exercise) 2 2      Duration Continue with 30 min of aerobic exercise without signs/symptoms of physical distress. Continue with 30 min of aerobic exercise without signs/symptoms of physical distress.      Intensity Other (comment)  40-80% HRR THRR unchanged        Resistance Training   Training Prescription Yes Yes      Weight yellow bands yellow bands      Reps 10-15 10-15      Time 10 Minutes 10 Minutes        Oxygen   Oxygen Continuous Continuous      Liters 3 3        Recumbant Bike   Level 1.5 1.5      Minutes 15 15      METs 1.5 1.6        NuStep   Level 2 3      SPM 80 80      Minutes 15 15      METs 1.6 1.6               Exercise Comments:   Exercise Comments     Row Name 12/30/21 1520           Exercise Comments Pt completed first day of exercise. He exercised for 25 min on the recumbent bike. He took a couple of rest breaks. Jamair is very deconditioned and limited by his CVA. He performed most of the warmup. Keaun did have to take rest breaks during the warmup. Staff did 1-on-1 cooldown with him.                Exercise Goals and Review:   Exercise Goals     Row Name 12/22/21 1127 12/23/21 0911 01/14/22 1150         Exercise Goals   Increase Physical Activity Yes Yes Yes     Intervention Provide advice, education, support and counseling about physical  activity/exercise needs.;Develop an individualized exercise prescription for aerobic and resistive training based on initial evaluation findings, risk stratification, comorbidities and participant's personal goals. Provide advice, education, support and counseling about physical activity/exercise needs.;Develop an individualized exercise prescription for aerobic and resistive training based on initial evaluation findings, risk stratification, comorbidities and participant's personal goals. Provide advice, education, support and counseling about physical activity/exercise needs.;Develop an individualized exercise prescription for aerobic and resistive training based on initial evaluation findings, risk stratification, comorbidities and participant's personal goals.     Expected Outcomes Short Term: Attend rehab on a regular basis to increase amount of physical activity.;Long  Term: Add in home exercise to make exercise part of routine and to increase amount of physical activity.;Long Term: Exercising regularly at least 3-5 days a week. Short Term: Attend rehab on a regular basis to increase amount of physical activity.;Long Term: Add in home exercise to make exercise part of routine and to increase amount of physical activity.;Long Term: Exercising regularly at least 3-5 days a week. Short Term: Attend rehab on a regular basis to increase amount of physical activity.;Long Term: Add in home exercise to make exercise part of routine and to increase amount of physical activity.;Long Term: Exercising regularly at least 3-5 days a week.     Increase Strength and Stamina Yes Yes Yes     Intervention Provide advice, education, support and counseling about physical activity/exercise needs.;Develop an individualized exercise prescription for aerobic and resistive training based on initial evaluation findings, risk stratification, comorbidities and participant's personal goals. Provide advice, education, support and  counseling about physical activity/exercise needs.;Develop an individualized exercise prescription for aerobic and resistive training based on initial evaluation findings, risk stratification, comorbidities and participant's personal goals. Provide advice, education, support and counseling about physical activity/exercise needs.;Develop an individualized exercise prescription for aerobic and resistive training based on initial evaluation findings, risk stratification, comorbidities and participant's personal goals.     Expected Outcomes Short Term: Increase workloads from initial exercise prescription for resistance, speed, and METs.;Short Term: Perform resistance training exercises routinely during rehab and add in resistance training at home;Long Term: Improve cardiorespiratory fitness, muscular endurance and strength as measured by increased METs and functional capacity (6MWT) Short Term: Increase workloads from initial exercise prescription for resistance, speed, and METs.;Short Term: Perform resistance training exercises routinely during rehab and add in resistance training at home;Long Term: Improve cardiorespiratory fitness, muscular endurance and strength as measured by increased METs and functional capacity (6MWT) Short Term: Increase workloads from initial exercise prescription for resistance, speed, and METs.;Short Term: Perform resistance training exercises routinely during rehab and add in resistance training at home;Long Term: Improve cardiorespiratory fitness, muscular endurance and strength as measured by increased METs and functional capacity (6MWT)     Able to understand and use rate of perceived exertion (RPE) scale Yes Yes Yes     Intervention Provide education and explanation on how to use RPE scale Provide education and explanation on how to use RPE scale Provide education and explanation on how to use RPE scale     Expected Outcomes Short Term: Able to use RPE daily in rehab to express  subjective intensity level;Long Term:  Able to use RPE to guide intensity level when exercising independently Short Term: Able to use RPE daily in rehab to express subjective intensity level;Long Term:  Able to use RPE to guide intensity level when exercising independently Short Term: Able to use RPE daily in rehab to express subjective intensity level;Long Term:  Able to use RPE to guide intensity level when exercising independently     Able to understand and use Dyspnea scale Yes Yes Yes     Intervention Provide education and explanation on how to use Dyspnea scale Provide education and explanation on how to use Dyspnea scale Provide education and explanation on how to use Dyspnea scale     Expected Outcomes Short Term: Able to use Dyspnea scale daily in rehab to express subjective sense of shortness of breath during exertion;Long Term: Able to use Dyspnea scale to guide intensity level when exercising independently Short Term: Able to use Dyspnea scale daily in rehab to  express subjective sense of shortness of breath during exertion;Long Term: Able to use Dyspnea scale to guide intensity level when exercising independently Short Term: Able to use Dyspnea scale daily in rehab to express subjective sense of shortness of breath during exertion;Long Term: Able to use Dyspnea scale to guide intensity level when exercising independently     Knowledge and understanding of Target Heart Rate Range (THRR) Yes Yes Yes     Intervention Provide education and explanation of THRR including how the numbers were predicted and where they are located for reference Provide education and explanation of THRR including how the numbers were predicted and where they are located for reference Provide education and explanation of THRR including how the numbers were predicted and where they are located for reference     Expected Outcomes Short Term: Able to state/look up THRR;Long Term: Able to use THRR to govern intensity when  exercising independently;Short Term: Able to use daily as guideline for intensity in rehab Short Term: Able to state/look up THRR;Long Term: Able to use THRR to govern intensity when exercising independently;Short Term: Able to use daily as guideline for intensity in rehab Short Term: Able to state/look up THRR;Long Term: Able to use THRR to govern intensity when exercising independently;Short Term: Able to use daily as guideline for intensity in rehab     Understanding of Exercise Prescription Yes Yes Yes     Intervention Provide education, explanation, and written materials on patient's individual exercise prescription Provide education, explanation, and written materials on patient's individual exercise prescription Provide education, explanation, and written materials on patient's individual exercise prescription     Expected Outcomes Short Term: Able to explain program exercise prescription;Long Term: Able to explain home exercise prescription to exercise independently Short Term: Able to explain program exercise prescription;Long Term: Able to explain home exercise prescription to exercise independently Short Term: Able to explain program exercise prescription;Long Term: Able to explain home exercise prescription to exercise independently              Exercise Goals Re-Evaluation :  Exercise Goals Re-Evaluation     Row Name 12/23/21 0911 01/14/22 1150           Exercise Goal Re-Evaluation   Exercise Goals Review Increase Physical Activity;Increase Strength and Stamina;Able to understand and use rate of perceived exertion (RPE) scale;Able to understand and use Dyspnea scale;Knowledge and understanding of Target Heart Rate Range (THRR);Understanding of Exercise Prescription Increase Physical Activity;Increase Strength and Stamina;Able to understand and use rate of perceived exertion (RPE) scale;Able to understand and use Dyspnea scale;Knowledge and understanding of Target Heart Rate Range  (THRR);Understanding of Exercise Prescription      Comments Alani is scheduled to begin exercise next week. Will continue to monitor and progress as able. Hendrick has completed 5 exercise sessions. He is exercising on the recumbent bike for 15 min, level 1.5 at 1.6 METs. He is then exercising on the nustep for 15 min at level 2 for 1.6 METs. His METs have not progressed yet. He is performing modified warm up and cool down with one on one verbal and demonstrative cues tolerating well. Will discuss home exercise soon. Will continue to monitor and progress as tolerated.      Expected Outcomes Through exercise at rehab and home, the patient will decrease shortness of breath with daily activities and feel confident in carrying out an exercise regimen at home. Through exercise at rehab and home, the patient will decrease shortness of breath with daily activities and  feel confident in carrying out an exercise regimen at home.               Discharge Exercise Prescription (Final Exercise Prescription Changes):  Exercise Prescription Changes - 01/15/22 1519       Response to Exercise   Blood Pressure (Admit) 124/60    Blood Pressure (Exit) 112/54    Heart Rate (Admit) 83 bpm    Heart Rate (Exercise) 91 bpm    Heart Rate (Exit) 77 bpm    Oxygen Saturation (Admit) 95 %    Oxygen Saturation (Exercise) 94 %    Oxygen Saturation (Exit) 98 %    Rating of Perceived Exertion (Exercise) 13    Perceived Dyspnea (Exercise) 2    Duration Continue with 30 min of aerobic exercise without signs/symptoms of physical distress.    Intensity THRR unchanged      Resistance Training   Training Prescription Yes    Weight yellow bands    Reps 10-15    Time 10 Minutes      Oxygen   Oxygen Continuous    Liters 3      Recumbant Bike   Level 1.5    Minutes 15    METs 1.6      NuStep   Level 3    SPM 80    Minutes 15    METs 1.6             Nutrition:  Target Goals: Understanding of nutrition  guidelines, daily intake of sodium <1512m, cholesterol <2065m calories 30% from fat and 7% or less from saturated fats, daily to have 5 or more servings of fruits and vegetables.  Biometrics:    Nutrition Therapy Plan and Nutrition Goals:  Nutrition Therapy & Goals - 12/30/21 1417       Nutrition Therapy   Diet Heart healthy/general healthy diet   patient lives in assisted living and is prescribed a general diet there   Drug/Food Interactions Statins/Certain Fruits      Personal Nutrition Goals   Nutrition Goal Patient to choose a daily variety of fruits, vegetables, whole grains, lean protein/plant protein, nonfat/lowfat dairy as part of heart healthy lifestyle    Personal Goal #2 Patient to limit to <[id number] of sodium daily    Comments Patient diagnosed with expressive aphasia. His ex-wife and support, SusDaine Florasas present today. She reports that [facility] eats three meals per day and 2 snacks and is prescribed a general diet. They do eat out ~3x/week together and he enjoys sweets daily. Weight is stable with appropriate appetite. Lipids WNL, HDL 38      Intervention Plan   Intervention Prescribe, educate and counsel regarding individualized specific dietary modifications aiming towards targeted core components such as weight, hypertension, lipid management, diabetes, heart failure and other comorbidities.    Expected Outcomes Short Term Goal: Understand basic principles of dietary content, such as calories, fat, sodium, cholesterol and nutrients.;Long Term Goal: Adherence to prescribed nutrition plan.             Nutrition Assessments:  Nutrition Assessments - 12/30/21 1432       Rate Your Plate Scores   Pre Score 50   some uncertainty of cooking methods/fats as he lives in assisted living and does not do his own cooking           MEDIFICTS Score Key: ?70 Need to make dietary changes  40-70 Heart Healthy Diet ? 40 Therapeutic Level  Cholesterol  Diet  Flowsheet Row PULMONARY REHAB OTHER RESPIRATORY from 12/30/2021 in [place]  Picture Your Plate Total Score on Admission 50      Picture Your Plate Scores: <12 Unhealthy dietary pattern with much room for improvement. 41-50 Dietary pattern unlikely to meet recommendations for good health and room for improvement. 51-60 More healthful dietary pattern, with some room for improvement.  >60 Healthy dietary pattern, although there may be some specific behaviors that could be improved.    Nutrition Goals Re-Evaluation:  Nutrition Goals Re-Evaluation     [place] Name 12/30/21 1417             Goals   Current Weight 184 lb 15.5 oz (83.9 kg)       Comment Patient diagnosed with expressive aphasia. His ex-wife and support, Daine Floras, was present today. She reports that Selinda Flavin lives at [facility]; he eats three meals per day and 2 snacks and is prescribed a general diet. They do eat out ~3x/week together and he enjoys sweets daily. Weight is stable with appropriate appetite. Lipids WNL, HDL 38                Nutrition Goals Discharge (Final Nutrition Goals Re-Evaluation):  Nutrition Goals Re-Evaluation - 12/30/21 1417       Goals   Current Weight 184 lb 15.5 oz (83.9 kg)    Comment Patient diagnosed with expressive aphasia. His ex-wife and support, Daine Floras, was present today. She reports that Selinda [facility] lives at [facility]; he eats three meals per day and 2 snacks and is prescribed a general diet. They do eat out ~3x/week together and he enjoys sweets daily. Weight is stable with appropriate appetite. Lipids WNL, HDL 38             Psychosocial: Target Goals: Acknowledge presence or absence of significant depression and/or stress, maximize coping skills, provide positive support system. Participant is able to verbalize types and ability to use techniques and skills needed for reducing stress and depression.  Initial  Review & Psychosocial Screening:  Initial Psych Review & Screening - 12/22/21 1119       Initial Review   Current issues with None Identified      Family Dynamics   Good Support System? Yes    Comments Manuela Schwartz      Barriers   Psychosocial barriers to participate in program There are no identifiable barriers or psychosocial needs.      Screening Interventions   Interventions Encouraged to exercise             Quality of Life Scores:  Scores of 19 and below usually indicate a poorer quality of life in these areas.  A difference of  2-3 points is a clinically meaningful difference.  A difference of 2-3 points in the total score of the Quality of Life Index has been associated with significant improvement in overall quality of life, self-image, physical symptoms, and general health in studies assessing change in quality of life.  PHQ-9: Review Flowsheet       12/22/2021  Depression screen PHQ 2/9  Decreased Interest 0  Down, Depressed, Hopeless 0  PHQ - 2 Score 0  Altered sleeping 0  Tired, decreased energy 0  Change in appetite 0  Feeling bad or failure about yourself  0  Trouble concentrating 0  Moving slowly or fidgety/restless 0  Suicidal thoughts 0  PHQ-9 Score 0   Interpretation of Total Score  Total Score Depression Severity:  1-4 = Minimal depression, 5-9 = Mild depression, 10-14 = Moderate depression, 15-19 = Moderately severe depression, 20-27 = Severe depression   Psychosocial Evaluation and Intervention:  Psychosocial Evaluation - 12/22/21 1119       Psychosocial Evaluation & Interventions   Interventions Encouraged to exercise with the program and follow exercise prescription    Expected Outcomes For Zhion to participate in Pulmonary Rehab    Continue Psychosocial Services  Follow up required by staff             Psychosocial Re-Evaluation:  Psychosocial Re-Evaluation     [place] Name 12/24/21 0912 01/13/22 1550           Psychosocial  Re-Evaluation   Current issues with None Identified None Identified      Comments Kinkade has attented the PR orientation session 12/22/21. He is scheduled to start attending PR 12/30/21. Will continue to monitor for any psychosocial barriers. No psychosocial concerns or barriers have been identified at this time for [facility].  His exwife is very supportive and drives him to all his exercise classes.      Expected Outcomes For him to participate in the program without any psychosocial issues or concerns. For Akim to continue to be without psychosocial concerns while participating in pulmonary rehab.      Interventions Encouraged to attend Pulmonary Rehabilitation for the exercise Encouraged to attend Pulmonary Rehabilitation for the exercise      Continue Psychosocial Services  Follow up required by staff No Follow up required               Psychosocial Discharge (Final Psychosocial Re-Evaluation):  Psychosocial Re-Evaluation - 01/13/22 1550       Psychosocial Re-Evaluation   Current issues with None Identified    Comments No psychosocial concerns or barriers have been identified at this time for [facility].  His exwife is very supportive and drives him to all his exercise classes.    Expected Outcomes For Stephaun to continue to be without psychosocial concerns while participating in pulmonary rehab.    Interventions Encouraged to attend Pulmonary Rehabilitation for the exercise    Continue Psychosocial Services  No Follow up required             Education: Education Goals: Education classes will be provided on a weekly basis, covering required topics. Participant will state understanding/return demonstration of topics presented.  Learning Barriers/Preferences:  Learning Barriers/Preferences - 12/22/21 1120       Learning Barriers/Preferences   Learning Barriers --   CVA  in 2001 and 2013   Learning Preferences None             Education Topics: Introduction to Pulmonary Rehab Group  instruction provided by PowerPoint, verbal discussion, and written material to support subject matter. Instructor reviews what Pulmonary Rehab is, the purpose of the program, and how patients are referred.     Know Your Numbers Group instruction that is supported by a PowerPoint presentation. Instructor discusses importance of knowing and understanding resting, exercise, and post-exercise oxygen saturation, heart rate, and blood pressure. Oxygen saturation, heart rate, blood pressure, rating of perceived exertion, and dyspnea are reviewed along with a normal range for these values.    Exercise for the Pulmonary Patient Group instruction that is supported by a PowerPoint presentation. Instructor discusses benefits of exercise, core components of exercise, frequency, duration, and intensity of an exercise routine, importance of utilizing pulse oximetry during exercise, safety while exercising, and options of places to exercise outside of  rehab.       MET Level  Group instruction provided by PowerPoint, verbal discussion, and written material to support subject matter. Instructor reviews what METs are and how to increase METs.    Pulmonary Medications Verbally interactive group education provided by instructor with focus on inhaled medications and proper administration. Flowsheet Row PULMONARY REHAB OTHER RESPIRATORY from 01/15/2022 in [place]  Date 01/08/22  Educator Donnetta Simpers  Instruction Review Code 1- Verbalizes Understanding       Anatomy and Physiology of the Respiratory System Group instruction provided by PowerPoint, verbal discussion, and written material to support subject matter. Instructor reviews respiratory cycle and anatomical components of the respiratory system and their functions. Instructor also reviews differences in obstructive and restrictive respiratory diseases with examples of each.  Flowsheet Row PULMONARY REHAB OTHER RESPIRATORY  from 01/15/2022 in [place]  Date 01/15/22  Educator Donnetta Simpers  Instruction Review Code 2- Demonstrated Understanding       Oxygen Safety Group instruction provided by PowerPoint, verbal discussion, and written material to support subject matter. There is an overview of "What is Oxygen" and "Why do we need it".  Instructor also reviews how to create a safe environment for oxygen use, the importance of using oxygen as prescribed, and the risks of noncompliance. There is a brief discussion on traveling with oxygen and resources the patient may utilize.   Oxygen Use Group instruction provided by PowerPoint, verbal discussion, and written material to discuss how supplemental oxygen is prescribed and different types of oxygen supply systems. Resources for more information are provided.    Breathing Techniques Group instruction that is supported by demonstration and informational handouts. Instructor discusses the benefits of pursed lip and diaphragmatic breathing and detailed demonstration on how to perform both.     Risk Factor Reduction Group instruction that is supported by a PowerPoint presentation. Instructor discusses the definition of a risk factor, different risk factors for pulmonary disease, and how the heart and lungs work together.   MD Day A group question and answer session with a medical doctor that allows participants to ask questions that relate to their pulmonary disease state.   Nutrition for the Pulmonary Patient Group instruction provided by PowerPoint slides, verbal discussion, and written materials to support subject matter. The instructor gives an explanation and review of healthy diet recommendations, which includes a discussion on weight management, recommendations for fruit and vegetable consumption, as well as protein, fluid, caffeine, fiber, sodium, sugar, and alcohol. Tips for eating when patients are short of breath are  discussed. Flowsheet Row PULMONARY REHAB OTHER RESPIRATORY from 01/15/2022 in [place]  Date 01/01/22  Educator Sam RD  [Handout]  Instruction Review Code 1- Verbalizes Understanding        Other Education Group or individual verbal, written, or video instructions that support the educational goals of the pulmonary rehab program.    Knowledge Questionnaire Score:  Knowledge Questionnaire Score - 12/22/21 1105       Knowledge Questionnaire Score   Pre Score 16/18             Core Components/Risk Factors/Patient Goals at Admission:  Personal Goals and Risk Factors at Admission - 12/22/21 1121       Core Components/Risk Factors/Patient Goals on Admission    Weight Management Weight Loss    Improve shortness of breath with ADL's Yes    Intervention Provide education, individualized exercise plan and daily activity instruction  to help decrease symptoms of SOB with activities of daily living.    Expected Outcomes Short Term: Improve cardiorespiratory fitness to achieve a reduction of symptoms when performing ADLs;Long Term: Be able to perform more ADLs without symptoms or delay the onset of symptoms             Core Components/Risk Factors/Patient Goals Review:   Goals and Risk Factor Review     Row Name 12/24/21 0916 01/13/22 1552 01/13/22 1557         Core Components/Risk Factors/Patient Goals Review   Personal Goals Review Other Improve shortness of breath with ADL's;Weight Management/Obesity --     Review Weight loss has been a concern for him. He is scheduled to start PR 12/30/21. Will continue to monitor core components. Keeyon has attended 5 exercise sessions, and has lost no weight, but he is gaining strength and stamina.  He is challenged by 2 prior strokes and he is unable to use his right arm and hand and walking is slow.   He is very determined and works very hard despite his severe shortness of breath with activity.  He is  exercising @ 1.6 mets on the recumbent bike and nustep. --     Expected Outcomes See admission goals. -- See admission goals              Core Components/Risk Factors/Patient Goals at Discharge (Final Review):   Goals and Risk Factor Review - 01/13/22 1557       Core Components/Risk Factors/Patient Goals Review   Expected Outcomes See admission goals             ITP Comments: Pt is making expected progress toward Pulmonary Rehab goals after completing 6 sessions. Recommend continued exercise, life style modification, education, and utilization of breathing techniques to increase stamina and strength, while also decreasing shortness of breath with exertion.  Dr. Rodman Pickle is Medical Director for Pulmonary Rehab at [facility].

## 2022-01-22 ENCOUNTER — Encounter (HOSPITAL_COMMUNITY)
Admission: RE | Admit: 2022-01-22 | Discharge: 2022-01-22 | Disposition: A | Discharge status: Home / Self Care | Payer: Medicare HMO | Source: Ambulatory Visit | Attending: Pulmonary Disease | Admitting: Pulmonary Disease

## 2022-01-22 DIAGNOSIS — J849 Interstitial pulmonary disease, unspecified: Secondary | ICD-10-CM | POA: Insufficient documentation

## 2022-01-22 DIAGNOSIS — J9611 Chronic respiratory failure with hypoxia: Secondary | ICD-10-CM | POA: Diagnosis present

## 2022-01-22 NOTE — Progress Notes (Signed)
Daily Session Note  Patient Details  Name: Daniel Oconnell MRN: [id number] Date of Birth: [birth year] Referring Provider:   [name] Pulmonary Rehab Walk Test from 12/22/2021 in [place]  Referring Provider [name]       Encounter Date: 01/22/2022  Check In:  Session Check In - 01/22/22 1529       Check-In   Supervising physician immediately available to respond to emergencies [facility] immediately available    Physician(s) Dr. Cyndia Skeeters    Location [facility]    Staff Present Rosebud Poles, RN, Quentin Ore, MS, ACSM-CEP, Exercise Physiologist;Sharilyn [facility] Yevonne Pax, ACSM-CEP, Exercise Physiologist;Lisa Ysidro Evert, RN    Virtual Visit No    Medication changes reported     No    Fall or balance concerns reported    No    Tobacco Cessation No Change    Warm-up and Cool-down Performed as group-led instruction    Resistance Training Performed Yes    VAD Patient? No    PAD/SET Patient? No      Pain Assessment   Currently in Pain? No/denies    Multiple Pain Sites No             Capillary Blood Glucose: No results found for this or any previous visit (from the past 24 hour(s)).   Exercise Prescription Changes - 01/22/22 1500       Home Exercise Plan   Plans to continue exercise at Home (comment)   bike, walking   Frequency Add 2 additional days to program exercise sessions.    Initial Home Exercises Provided 01/22/22             Social History   Tobacco Use  Smoking Status Former   Packs/day: 1.00   Years: 28.00   Total pack years: 28.00   Types: Cigarettes   Quit date: 2013   Years since quitting: 10.6  Smokeless Tobacco Never    Goals Met:  Exercise tolerated well No report of concerns or symptoms today Strength training completed today  Goals Unmet:  Not Applicable  Comments: Service time is from 1308 to 1445.    Dr. Rodman Pickle is Medical Director for Pulmonary Rehab at [facility].

## 2022-01-22 NOTE — Progress Notes (Signed)
Home Exercise Prescription I have reviewed a Home Exercise Prescription with Daniel Oconnell. He is not currently exercising at home. He plans to start riding bike or walking 5 min to build up to 30 eventually. He plans to add 2 nonrehab days.The patient stated that their goals were to keep moving. We reviewed exercise guidelines, target heart rate during exercise, RPE Scale, weather conditions, endpoints for exercise, warmup and cool down. The patient is encouraged to come to me with any questions. I will continue to follow up with the patient to assist them with progression and safety.    Emanuele Mcwhirter [place], [place], ACSM-CEP 01/22/2022 3:39 PM

## 2022-01-23 ENCOUNTER — Ambulatory Visit: Payer: Medicare HMO | Admitting: Pulmonary Disease

## 2022-01-23 ENCOUNTER — Encounter: Payer: Self-pay | Admitting: Pulmonary Disease

## 2022-01-23 VITALS — BP 130/80 | HR 69 | Ht 67.0 in | Wt 184.0 lb

## 2022-01-23 DIAGNOSIS — J439 Emphysema, unspecified: Secondary | ICD-10-CM

## 2022-01-23 DIAGNOSIS — J849 Interstitial pulmonary disease, unspecified: Secondary | ICD-10-CM | POA: Diagnosis not present

## 2022-01-23 DIAGNOSIS — J9611 Chronic respiratory failure with hypoxia: Secondary | ICD-10-CM

## 2022-01-23 NOTE — Progress Notes (Unsigned)
Synopsis: Referred in March 2023 for COPD by Uvaldo Bristle, PA  Subjective:   PATIENT ID: Daniel Oconnell GENDER: male DOB: 1955-10-28, MRN: [id number]   HPI  No chief complaint on file.  Daniel Oconnell is a 66 year old male, former smoker with hypertension, coronary artery disease s/p CABG and CVA who is referred to pulmonary clinic for COPD.   He was seen by Rhunette Croft, NP 6/27 and 8/9. He was started on breztri at last visit.   HRCT Chest shows   Initial OV 07/18/21 He is accompanied by his ex-wife, Daniel Oconnell. He has right sided deficit and does have degree of aphasia given his history of CVA. He reports being diagnosed with COPD many years ago and has been on supplemental oxygen since a hospitalization for pneumonia in 2017. He is currently using albuterol 2 puffs three times daily. He reports increasing dyspnea with his PT/OT sessions at [facility]. He lives at a nursing home. He is using 2.5L at rest and up to 4L with physical activity. He denies any cough or wheezing. He quit smoking in 2013 after his stroke. 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   He has over a 20 pack year smoking history. He is disabled due to the stroke.   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    Hyperlipidemia    Hypertension      Family History  Problem Relation Age of Onset   CVA Mother 74   Aneurysm Father      Social History   Socioeconomic History   Marital status: Divorced    Spouse name: Not on file   Number of children: 2   Years of education: Not on file   Highest education level: Not on file  Occupational  History   Occupation: Disabled    Comment: Licensed conveyancer  Tobacco Use   Smoking status: Former    Packs/day: 1.00    Years: 28.00    Total pack years: 28.00    Types: Cigarettes    Quit date: 2013    Years since quitting: 10.6   Smokeless tobacco: Never  Substance and Sexual Activity   Alcohol use: No    Comment: h/o heavy ETOH use   Drug use: No    Types: Cocaine, Marijuana    Comment: h/o drug use   Sexual activity: Not on file  Other Topics Concern   Not on file  Social History Narrative   Not on file   Social Determinants of Health   Financial Resource Strain: Not on file  Food Insecurity: Not on file  Transportation Needs: Not on file  Physical Activity: Not on file  Stress: Not on file  Social Connections: Not on file  Intimate Partner Violence: Not on file     Allergies  Allergen Reactions   Clonidine Diarrhea    Not listed on MAR   Sulfa Antibiotics Other (See Comments)    Unknown; not listed on MAR     Outpatient Medications Prior to Visit  Medication Sig Dispense Refill   albuterol (PROVENTIL) (2.5 MG/3ML) 0.083% nebulizer solution  Take 3 mLs (2.5 mg total) by nebulization every 4 (four) hours as needed for wheezing or shortness of breath. 75 mL 0   albuterol (VENTOLIN HFA) 108 (90 Base) MCG/ACT inhaler Inhale 1-2 puffs into the lungs every 6 (six) hours as needed for wheezing or shortness of breath. 8 g 6   BREZTRI AEROSPHERE 160-9-4.8 MCG/ACT AERO Inhale into the lungs.     mometasone (NASONEX) 50 MCG/ACT nasal spray SMARTSIG:Both Nares     acetaminophen (TYLENOL) 500 MG tablet Take 500 mg by mouth every 4 (four) hours as needed for mild pain, moderate pain, fever or headache.     alum & mag hydroxide-simeth (MAALOX/MYLANTA) 200-200-20 MG/5ML suspension Take 30 mLs by mouth as needed for indigestion or heartburn.     ASPIRIN 81 PO Take 81 mg by mouth daily.     atorvastatin (LIPITOR) 80 MG tablet Take 80 mg by mouth at bedtime.      baclofen  (LIORESAL) 20 MG tablet Take 20 mg by mouth 3 (three) times daily.     clopidogrel (PLAVIX) 75 MG tablet Take 75 mg by mouth daily with breakfast.      dicyclomine (BENTYL) 10 MG capsule Take 10 mg by mouth daily.     docusate sodium (COLACE) 100 MG capsule Take 100 mg by mouth 2 (two) times daily.     furosemide (LASIX) 40 MG tablet Take 1 tablet (40 mg total) by mouth daily. 90 tablet 3   guaifenesin (ROBITUSSIN) 100 MG/5ML syrup Take 200 mg by mouth every 6 (six) hours as needed for cough.     hydrALAZINE (APRESOLINE) 100 MG tablet Take 1 tablet (100 mg total) by mouth 3 (three) times daily. 270 tablet 3   levETIRAcetam (KEPPRA) 750 MG tablet Take 750 mg by mouth 2 (two) times daily.     levothyroxine (SYNTHROID, LEVOTHROID) 25 MCG tablet Take 1 tablet by mouth daily.     loperamide (IMODIUM) 2 MG capsule Take by mouth as needed for diarrhea or loose stools.     magnesium hydroxide (MILK OF MAGNESIA) 400 MG/5ML suspension Take 30 mLs by mouth at bedtime as needed for mild constipation.     melatonin 3 MG TABS tablet Take 3 mg by mouth at bedtime.     neomycin-bacitracin-polymyxin (NEOSPORIN) ointment Apply 1 application. topically as needed for wound care.     Polyethyl Glycol-Propyl Glycol (SYSTANE ULTRA) 0.4-0.3 % SOLN Apply 1 drop to eye 3 (three) times daily.     polyethylene glycol powder (GLYCOLAX/MIRALAX) powder Take 17 g by mouth 2 (two) times daily. Use for constipation, as prescribed, until daily soft stools  OTC 119 g 0   potassium chloride (KLOR-CON M) 10 MEQ tablet Take 2 tablets (20 mEq total) by mouth daily. 180 tablet 3   sertraline (ZOLOFT) 100 MG tablet Take 100 mg by mouth daily with breakfast.      tamsulosin (FLOMAX) 0.4 MG CAPS capsule Take 1 capsule (0.4 mg total) by mouth daily. 30 capsule 0   torsemide (DEMADEX) 20 MG tablet Take 2 tablets (40 mg total) by mouth daily. 180 tablet 1   traZODone (DESYREL) 100 MG tablet Take 100 mg by mouth at bedtime.     vitamin B-12  (CYANOCOBALAMIN) 1000 MCG tablet Take 1,000 mcg by mouth daily.     No facility-administered medications prior to visit.   Review of Systems  Constitutional:  Negative for chills, fever, malaise/fatigue and weight loss.  HENT:  Negative for congestion, sinus pain and sore throat.  Eyes: Negative.   Respiratory:  Positive for shortness of breath. Negative for cough, hemoptysis, sputum production and wheezing.   Cardiovascular:  Negative for chest pain, palpitations, orthopnea, claudication and leg swelling.  Gastrointestinal:  Negative for abdominal pain, heartburn, nausea and vomiting.  Genitourinary: Negative.   Musculoskeletal:  Negative for joint pain and myalgias.  Skin:  Negative for rash.  Neurological:  Negative for weakness.  Endo/Heme/Allergies: Negative.   Psychiatric/Behavioral: Negative.      Objective:   Vitals:   01/23/22 1617  BP: 130/80  Pulse: 69  SpO2: 95%  Weight: 184 lb (83.5 kg)  Height: 5\' 7"  (1.702 m)   Physical Exam Constitutional:      General: He is not in acute distress.    Comments: Nasal canula in place, POC  HENT:     Head: Normocephalic and atraumatic.  Eyes:     Extraocular Movements: Extraocular movements intact.     Conjunctiva/sclera: Conjunctivae normal.     Pupils: Pupils are equal, round, and reactive to light.  Cardiovascular:     Rate and Rhythm: Normal rate and regular rhythm.     Pulses: Normal pulses.     Heart sounds: Normal heart sounds. No murmur heard. Pulmonary:     Effort: Pulmonary effort is normal.     Breath sounds: Decreased breath sounds present. No wheezing, rhonchi or rales.  Abdominal:     General: Bowel sounds are normal.     Palpations: Abdomen is soft.  Musculoskeletal:     Right lower leg: No edema.     Left lower leg: No edema.  Lymphadenopathy:     Cervical: No cervical adenopathy.  Skin:    General: Skin is warm and dry.  Neurological:     General: No focal deficit present.     Mental Status: He  is alert.  Psychiatric:        Mood and Affect: Mood normal.        Behavior: Behavior normal.        Thought Content: Thought content normal.        Judgment: Judgment normal.     CBC    Component Value Date/Time   WBC 11.7 (H) 12/24/2021 1253   RBC 4.96 12/24/2021 1253   HGB 13.9 12/24/2021 1253   HGB 14.7 07/24/2021 1113   HCT 41.6 12/24/2021 1253   HCT 43.7 07/24/2021 1113   PLT 240.0 12/24/2021 1253   PLT 204 07/24/2021 1113   MCV 84.0 12/24/2021 1253   MCV 85 07/24/2021 1113   MCH 28.4 07/24/2021 1113   MCH 28.6 08/15/2019 0545   MCHC 33.3 12/24/2021 1253   RDW 14.1 12/24/2021 1253   RDW 13.5 07/24/2021 1113   LYMPHSABS 1.6 12/24/2021 1253   LYMPHSABS 1.8 07/24/2021 1113   MONOABS 0.7 12/24/2021 1253   EOSABS 0.2 12/24/2021 1253   EOSABS 0.2 07/24/2021 1113   BASOSABS 0.1 12/24/2021 1253   BASOSABS 0.1 07/24/2021 1113      Latest Ref Rng & Units 11/19/2021    4:15 PM 10/15/2021   12:59 PM 09/04/2021    1:37 PM  BMP  Glucose 70 - 99 mg/dL 98  81  90   BUN 8 - 27 mg/dL 13  9  10    Creatinine 0.76 - 1.27 mg/dL 09/06/2021   3.71   BUN/Creat Ratio 10 - 24 13  9  10    Sodium 134 - 144 mmol/L 140  140  144   Potassium 3.5 - 5.2  mmol/L 3.9  4.0  3.4   Chloride 96 - 106 mmol/L 96  99  99   CO2 20 - 29 mmol/L 29  29  31    Calcium 8.6 - 10.2 mg/dL 9.2  8.9  9.2    Chest imaging: CXR 08/13/19 Single frontal view of the chest demonstrates postsurgical changes from median sternotomy. The cardiac silhouette is stable. There is chronic central vascular congestion and diffuse interstitial prominence. No airspace disease, effusion, or pneumothorax.  CTA Chest 04/16/16 Mediastinum/Nodes: No mediastinal lymphadenopathy. There is no hilar lymphadenopathy. Borderline lymphadenopathy is identified in the right hilum. The esophagus has normal imaging features. There is no axillary lymphadenopathy.   Lungs/Pleura: Emphysema noted bilaterally with interlobular septal thickening  and bronchial wall thickening. Dependent collapse/consolidation is identified in both lower lobes and small bilateral pleural effusions are evident.  PFT:    Latest Ref Rng & Units 12/24/2021   10:55 AM  PFT Results  FVC-Pre L 2.84   FVC-Predicted Pre % 70   FVC-Post L 2.77   FVC-Predicted Post % 69   Pre FEV1/FVC % % 62   Post FEV1/FCV % % 62   FEV1-Pre L 1.75   FEV1-Predicted Pre % 59   FEV1-Post L 1.72   DLCO uncorrected ml/min/mmHg 8.89   DLCO UNC% % 37   DLCO corrected ml/min/mmHg 8.89   DLCO COR %Predicted % 37   DLVA Predicted % 51   TLC L 4.27   TLC % Predicted % 66   RV % Predicted % 59    Labs:  Path:  Echo:  Heart Catheterization:  Assessment & Plan:   No diagnosis found.  Discussion: Aadarsh Cozort is a 66 year old male, former smoker with hypertension, coronary artery disease s/p CABG and CVA who is referred to pulmonary clinic for COPD.   He has history of emphysema as noted by his previous CT chest scan in 2017.   We will start him on bevespi aerosphere, 2 puffs twice daily. He can continue to use albuterol inhaler as needed 1-2 puffs every 4-6 hours.   We will refer him to our lung cancer screening program as he has over a 20 pack year smoking history and quit within the past 15 years.   He is to continue supplemental oxygen with a goal O2 saturation around 92%.   Follow up in 6 months.   2018, MD [facility] Office: [phone]    Current Outpatient Medications:    albuterol (PROVENTIL) (2.5 MG/3ML) 0.083% nebulizer solution, Take 3 mLs (2.5 mg total) by nebulization every 4 (four) hours as needed for wheezing or shortness of breath., Disp: 75 mL, Rfl: 0   albuterol (VENTOLIN HFA) 108 (90 Base) MCG/ACT inhaler, Inhale 1-2 puffs into the lungs every 6 (six) hours as needed for wheezing or shortness of breath., Disp: 8 g, Rfl: 6   BREZTRI AEROSPHERE 160-9-4.8 MCG/ACT AERO, Inhale into the lungs., Disp: , Rfl:     mometasone (NASONEX) 50 MCG/ACT nasal spray, SMARTSIG:Both Nares, Disp: , Rfl:    acetaminophen (TYLENOL) 500 MG tablet, Take 500 mg by mouth every 4 (four) hours as needed for mild pain, moderate pain, fever or headache., Disp: , Rfl:    alum & mag hydroxide-simeth (MAALOX/MYLANTA) 200-200-20 MG/5ML suspension, Take 30 mLs by mouth as needed for indigestion or heartburn., Disp: , Rfl:    ASPIRIN 81 PO, Take 81 mg by mouth daily., Disp: , Rfl:    atorvastatin (LIPITOR) 80 MG tablet,  Take 80 mg by mouth at bedtime. , Disp: , Rfl:    baclofen (LIORESAL) 20 MG tablet, Take 20 mg by mouth 3 (three) times daily., Disp: , Rfl:    clopidogrel (PLAVIX) 75 MG tablet, Take 75 mg by mouth daily with breakfast. , Disp: , Rfl:    dicyclomine (BENTYL) 10 MG capsule, Take 10 mg by mouth daily., Disp: , Rfl:    docusate sodium (COLACE) 100 MG capsule, Take 100 mg by mouth 2 (two) times daily., Disp: , Rfl:    furosemide (LASIX) 40 MG tablet, Take 1 tablet (40 mg total) by mouth daily., Disp: 90 tablet, Rfl: 3   guaifenesin (ROBITUSSIN) 100 MG/5ML syrup, Take 200 mg by mouth every 6 (six) hours as needed for cough., Disp: , Rfl:    hydrALAZINE (APRESOLINE) 100 MG tablet, Take 1 tablet (100 mg total) by mouth 3 (three) times daily., Disp: 270 tablet, Rfl: 3   levETIRAcetam (KEPPRA) 750 MG tablet, Take 750 mg by mouth 2 (two) times daily., Disp: , Rfl:    levothyroxine (SYNTHROID, LEVOTHROID) 25 MCG tablet, Take 1 tablet by mouth daily., Disp: , Rfl:    loperamide (IMODIUM) 2 MG capsule, Take by mouth as needed for diarrhea or loose stools., Disp: , Rfl:    magnesium hydroxide (MILK OF MAGNESIA) 400 MG/5ML suspension, Take 30 mLs by mouth at bedtime as needed for mild constipation., Disp: , Rfl:    melatonin 3 MG TABS tablet, Take 3 mg by mouth at bedtime., Disp: , Rfl:    neomycin-bacitracin-polymyxin (NEOSPORIN) ointment, Apply 1 application. topically as needed for wound care., Disp: , Rfl:    Polyethyl  Glycol-Propyl Glycol (SYSTANE ULTRA) 0.4-0.3 % SOLN, Apply 1 drop to eye 3 (three) times daily., Disp: , Rfl:    polyethylene glycol powder (GLYCOLAX/MIRALAX) powder, Take 17 g by mouth 2 (two) times daily. Use for constipation, as prescribed, until daily soft stools  OTC, Disp: 119 g, Rfl: 0   potassium chloride (KLOR-CON M) 10 MEQ tablet, Take 2 tablets (20 mEq total) by mouth daily., Disp: 180 tablet, Rfl: 3   sertraline (ZOLOFT) 100 MG tablet, Take 100 mg by mouth daily with breakfast. , Disp: , Rfl:    tamsulosin (FLOMAX) 0.4 MG CAPS capsule, Take 1 capsule (0.4 mg total) by mouth daily., Disp: 30 capsule, Rfl: 0   torsemide (DEMADEX) 20 MG tablet, Take 2 tablets (40 mg total) by mouth daily., Disp: 180 tablet, Rfl: 1   traZODone (DESYREL) 100 MG tablet, Take 100 mg by mouth at bedtime., Disp: , Rfl:    vitamin B-12 (CYANOCOBALAMIN) 1000 MCG tablet, Take 1,000 mcg by mouth daily., Disp: , Rfl:

## 2022-01-23 NOTE — Patient Instructions (Addendum)
We will send in new oxygen order to [facility] for oxygen supply orders - portable oxygen concentrator at 2-4L pulsed - home oxygen concentrator at 3L continuous  Continue breztri 2 puffs twice daily - rinse mouth out after each use  We will check a CBC to monitor your WBC count - ok to come back next week to get blood drawn  Follow up in 6 months

## 2022-01-24 ENCOUNTER — Encounter: Payer: Self-pay | Admitting: Pulmonary Disease

## 2022-01-27 ENCOUNTER — Encounter (HOSPITAL_COMMUNITY)
Admission: RE | Admit: 2022-01-27 | Discharge: 2022-01-27 | Disposition: A | Discharge status: Home / Self Care | Payer: Medicare HMO | Source: Ambulatory Visit | Attending: Pulmonary Disease | Admitting: Pulmonary Disease

## 2022-01-27 DIAGNOSIS — J9611 Chronic respiratory failure with hypoxia: Secondary | ICD-10-CM | POA: Diagnosis not present

## 2022-01-27 NOTE — Progress Notes (Signed)
Daily Session Note  Patient Details  Name: Daniel Oconnell MRN: [id number] Date of Birth: 03-10-1956 Referring Provider:   April Oconnell Pulmonary Rehab Walk Test from 12/22/2021 in [place]  Referring Provider Daniel Oconnell       Encounter Date: 01/27/2022  Check In:  Session Check In - 01/27/22 1512       Check-In   Supervising physician immediately available to respond to emergencies Daniel Oconnell - Physician supervision    Physician(s) Daniel Oconnell    Location [facility]    Staff Present Daniel Poles, RN, Daniel Ore, MS, ACSM-CEP, Exercise Physiologist;Daniel Oconnell, ACSM-CEP, Exercise Physiologist;Daniel Hilmer Starleen Blue, MS, Exercise Physiologist    Virtual Visit No    Medication changes reported     No    Fall or balance concerns reported    No    Tobacco Cessation No Change    Warm-up and Cool-down Performed as group-led instruction    Resistance Training Performed Yes    VAD Patient? No    PAD/SET Patient? No      Pain Assessment   Currently in Pain? No/denies    Multiple Pain Sites No             Capillary Blood Glucose: No results found for this or any previous visit (from the past 24 hour(s)).    Social History   Tobacco Use  Smoking Status Former   Packs/day: 1.00   Years: 28.00   Total pack years: 28.00   Types: Cigarettes   Quit date: 2013   Years since quitting: 10.7  Smokeless Tobacco Never    Goals Met:  Proper associated with RPD/PD & O2 Sat Exercise tolerated well No report of concerns or symptoms today Strength training completed today  Goals Unmet:  Not Applicable  Comments: Service time is from 1315 to 1433.    Dr. Rodman Oconnell is Medical Director for Pulmonary Rehab at [facility].

## 2022-01-29 ENCOUNTER — Encounter (HOSPITAL_COMMUNITY)
Admission: RE | Admit: 2022-01-29 | Discharge: 2022-01-29 | Disposition: A | Discharge status: Home / Self Care | Payer: Medicare HMO | Source: Ambulatory Visit | Attending: Pulmonary Disease | Admitting: Pulmonary Disease

## 2022-01-29 DIAGNOSIS — J9611 Chronic respiratory failure with hypoxia: Secondary | ICD-10-CM | POA: Diagnosis not present

## 2022-01-29 NOTE — Progress Notes (Signed)
Daily Session Note  Patient Details  Name: Daniel Oconnell MRN: [id number] Date of Birth: 17-May-1956 Referring Provider:   [name] Pulmonary Rehab Walk Test from 12/22/2021 in [place]  Referring Provider [name]       Encounter Date: 01/29/2022  Check In:  Session Check In - 01/29/22 1423       Check-In   Supervising physician immediately available to respond to emergencies [facility] - Physician supervision    Physician(s) Erskine Emery    Location [facility]    Staff Present Elmon Else, MS, ACSM-CEP, Exercise Physiologist;Randi Yevonne Pax, ACSM-CEP, Exercise Physiologist;Lisa Assunta Curtis, MS, Exercise Physiologist    Virtual Visit No    Medication changes reported     No    Fall or balance concerns reported    No    Tobacco Cessation No Change    Warm-up and Cool-down Performed as group-led instruction    Resistance Training Performed Yes    VAD Patient? No    PAD/SET Patient? No      Pain Assessment   Currently in Pain? No/denies    Multiple Pain Sites No             Capillary Blood Glucose: No results found for this or any previous visit (from the past 24 hour(s)).    Social History   Tobacco Use  Smoking Status Former   Packs/day: 1.00   Years: 28.00   Total pack years: 28.00   Types: Cigarettes   Quit date: 2013   Years since quitting: 10.7  Smokeless Tobacco Never    Goals Met:  Proper associated with RPD/PD & O2 Sat Exercise tolerated well No report of concerns or symptoms today Strength training completed today  Goals Unmet:  Not Applicable  Comments: Service time is from 1314 to 1432.    Dr. Rodman Pickle is Medical Director for Pulmonary Rehab at [facility].

## 2022-01-30 ENCOUNTER — Other Ambulatory Visit (INDEPENDENT_AMBULATORY_CARE_PROVIDER_SITE_OTHER): Payer: Medicare HMO

## 2022-01-30 ENCOUNTER — Other Ambulatory Visit: Payer: Self-pay | Admitting: *Deleted

## 2022-01-30 DIAGNOSIS — J849 Interstitial pulmonary disease, unspecified: Secondary | ICD-10-CM

## 2022-01-30 LAB — CBC WITH DIFFERENTIAL/PLATELET
Basophils Absolute: 0 10*3/uL (ref 0.0–0.1)
Basophils Relative: 0.3 % (ref 0.0–3.0)
Eosinophils Absolute: 0.2 10*3/uL (ref 0.0–0.7)
Eosinophils Relative: 1.4 % (ref 0.0–5.0)
HCT: 41.8 % (ref 39.0–52.0)
Hemoglobin: 14.3 g/dL (ref 13.0–17.0)
Lymphocytes Relative: 13.8 % (ref 12.0–46.0)
Lymphs Abs: 1.5 10*3/uL (ref 0.7–4.0)
MCHC: 34.2 g/dL (ref 30.0–36.0)
MCV: 83.9 fl (ref 78.0–100.0)
Monocytes Absolute: 0.8 10*3/uL (ref 0.1–1.0)
Monocytes Relative: 7.7 % (ref 3.0–12.0)
Neutro Abs: 8.3 10*3/uL — ABNORMAL HIGH (ref 1.4–7.7)
Neutrophils Relative %: 76.8 % (ref 43.0–77.0)
Platelets: 220 10*3/uL (ref 150.0–400.0)
RBC: 4.98 Mil/uL (ref 4.22–5.81)
RDW: 14.3 % (ref 11.5–15.5)
WBC: 10.8 10*3/uL — ABNORMAL HIGH (ref 4.0–10.5)

## 2022-02-03 ENCOUNTER — Encounter (HOSPITAL_COMMUNITY)
Admission: RE | Admit: 2022-02-03 | Discharge: 2022-02-03 | Disposition: A | Discharge status: Home / Self Care | Payer: Medicare HMO | Source: Ambulatory Visit | Attending: Pulmonary Disease | Admitting: Pulmonary Disease

## 2022-02-03 VITALS — Wt 182.5 lb

## 2022-02-03 DIAGNOSIS — J9611 Chronic respiratory failure with hypoxia: Secondary | ICD-10-CM | POA: Diagnosis not present

## 2022-02-03 NOTE — Progress Notes (Signed)
Daily Session Note  Patient Details  Name: Daniel Oconnell MRN: [id number] Date of Birth: 1955/06/18 Referring Provider:   [name] Pulmonary Rehab Walk Test from 12/22/2021 in [place]  Referring Provider [name]       Encounter Date: 02/03/2022  Check In:  Session Check In - 02/03/22 1515       Check-In   Supervising physician immediately available to respond to emergencies [facility] - Physician supervision    Location [facility]    Staff Present Maurice Small, RN, BSN;Kionte Baumgardner Olen Cordial BS, ACSM-CEP, Exercise Physiologist;Kaylee Rosana Hoes, MS, ACSM-CEP, Exercise Physiologist;Annedrea Rosezella [place], RN, MHA;Samantha [place], RD, Jaclyn Shaggy, MS, ACSM-CEP, CCRP, Exercise Physiologist    Virtual Visit No    Fall or balance concerns reported    No    Tobacco Cessation No Change    Warm-up and Cool-down Performed as group-led instruction    Resistance Training Performed Yes    VAD Patient? No    PAD/SET Patient? No      Pain Assessment   Currently in Pain? No/denies    Multiple Pain Sites No             Capillary Blood Glucose: No results found for this or any previous visit (from the past 24 hour(s)).   Exercise Prescription Changes - 02/03/22 1500       Response to Exercise   Blood Pressure (Admit) 132/66    Blood Pressure (Exercise) 140/70    Blood Pressure (Exit) 130/70    Heart Rate (Admit) 78 bpm    Heart Rate (Exercise) 85 bpm    Heart Rate (Exit) 75 bpm    Oxygen Saturation (Admit) 96 %    Oxygen Saturation (Exercise) 94 %    Oxygen Saturation (Exit) 96 %    Rating of Perceived Exertion (Exercise) 15    Perceived Dyspnea (Exercise) 2    Duration Continue with 30 min of aerobic exercise without signs/symptoms of physical distress.    Intensity THRR unchanged      Progression   Progression Continue to progress workloads to maintain intensity without signs/symptoms of physical distress.      Resistance Training    Training Prescription Yes    Weight yellow bands    Reps 10-15    Time 10 Minutes      Interval Training   Interval Training No      Oxygen   Oxygen Continuous    Liters 4      Recumbant Bike   Level 2    Minutes 15    METs 1.6      NuStep   Level 4    SPM 80    Minutes 15    METs 1.7      Oxygen   Maintain Oxygen Saturation 88% or higher             Social History   Tobacco Use  Smoking Status Former   Packs/day: 1.00   Years: 28.00   Total pack years: 28.00   Types: Cigarettes   Quit date: 2013   Years since quitting: 10.7  Smokeless Tobacco Never    Goals Met:  Exercise tolerated well No report of concerns or symptoms today Strength training completed today  Goals Unmet:  Not Applicable  Comments: Service time is from 1309 to 1437.    Dr. Rodman Pickle is Medical Director for Pulmonary Rehab at [facility].

## 2022-02-05 ENCOUNTER — Encounter (HOSPITAL_COMMUNITY)
Admission: RE | Admit: 2022-02-05 | Discharge: 2022-02-05 | Disposition: A | Discharge status: Home / Self Care | Payer: Medicare HMO | Source: Ambulatory Visit | Attending: Pulmonary Disease | Admitting: Pulmonary Disease

## 2022-02-05 DIAGNOSIS — J9611 Chronic respiratory failure with hypoxia: Secondary | ICD-10-CM

## 2022-02-05 NOTE — Progress Notes (Signed)
Daily Session Note  Patient Details  Name: Daniel Oconnell MRN: [id number] Date of Birth: 10-29-1955 Referring Provider:   [name] Pulmonary Rehab Walk Test from 12/22/2021 in [place]  Referring Provider [name]       Encounter Date: 02/05/2022  Check In:  Session Check In - 02/05/22 1427       Check-In   Supervising physician immediately available to respond to emergencies [facility] - Physician supervision    Location [facility]    Staff Present Maurice Small, RN, BSN;Randi Olen Cordial BS, ACSM-CEP, Exercise Physiologist;Aurilla Coulibaly Rosana Hoes, MS, ACSM-CEP, Exercise Physiologist;Bailey Pearline Cables, MS, Exercise Physiologist;Jetta Gilford Rile BS, ACSM-CEP, Exercise Physiologist;Samantha [place], RD, LDN    Virtual Visit No    Medication changes reported     No    Fall or balance concerns reported    No    Tobacco Cessation No Change    Warm-up and Cool-down Performed as group-led instruction    Resistance Training Performed Yes    VAD Patient? No    PAD/SET Patient? No      Pain Assessment   Currently in Pain? No/denies    Multiple Pain Sites No             Capillary Blood Glucose: No results found for this or any previous visit (from the past 24 hour(s)).    Social History   Tobacco Use  Smoking Status Former   Packs/day: 1.00   Years: 28.00   Total pack years: 28.00   Types: Cigarettes   Quit date: 2013   Years since quitting: 10.7  Smokeless Tobacco Never    Goals Met:  Proper associated with RPD/PD & O2 Sat Exercise tolerated well No report of concerns or symptoms today Strength training completed today  Goals Unmet:  Not Applicable  Comments: Service time is from 1310 to 1438.    Dr. Rodman Pickle is Medical Director for Pulmonary Rehab at [facility].

## 2022-02-10 ENCOUNTER — Encounter (HOSPITAL_COMMUNITY)
Admission: RE | Admit: 2022-02-10 | Discharge: 2022-02-10 | Disposition: A | Discharge status: Home / Self Care | Payer: Medicare HMO | Source: Ambulatory Visit | Attending: Pulmonary Disease | Admitting: Pulmonary Disease

## 2022-02-10 DIAGNOSIS — J9611 Chronic respiratory failure with hypoxia: Secondary | ICD-10-CM | POA: Diagnosis not present

## 2022-02-10 NOTE — Progress Notes (Signed)
Daily Session Note  Patient Details  Name: Daniel Oconnell MRN: [id number] Date of Birth: 08/14/1955 Referring Provider:   Flowsheet Row Pulmonary Rehab Walk Test from 12/22/2021 in [facility]  Referring Provider [name]       Encounter Date: 02/10/2022  Check In:  Session Check In - 02/10/22 1529       Check-In   Supervising physician immediately available to respond to emergencies [facility] - Physician supervision    Physician(s) Dr. Paige    Location [facility]    Staff Present Kaylee Davis, MS, ACSM-CEP, Exercise Physiologist;Lisa Hughes, RN;Carlette Carlton, RN, BSN;Annedrea Stackhouse, RN, MHA;Randi Reeve BS, ACSM-CEP, Exercise Physiologist    Virtual Visit No    Medication changes reported     No    Fall or balance concerns reported    No    Tobacco Cessation No Change    Warm-up and Cool-down Performed as group-led instruction    Resistance Training Performed Yes    VAD Patient? No    PAD/SET Patient? No      Pain Assessment   Currently in Pain? No/denies    Multiple Pain Sites No             Capillary Blood Glucose: No results found for this or any previous visit (from the past 24 hour(s)).    Social History   Tobacco Use  Smoking Status Former   Packs/day: 1.00   Years: 28.00   Total pack years: 28.00   Types: Cigarettes   Quit date: 2013   Years since quitting: 10.7  Smokeless Tobacco Never    Goals Met:  Proper associated with RPD/PD & O2 Sat Exercise tolerated well No report of concerns or symptoms today Strength training completed today  Goals Unmet:  Not Applicable  Comments: Service time is from 1325 to 1441.    Dr. Jane Ellison is Medical Director for Pulmonary Rehab at [facility].  

## 2022-02-11 ENCOUNTER — Telehealth: Payer: Self-pay | Admitting: Pulmonary Disease

## 2022-02-11 NOTE — Progress Notes (Signed)
Pulmonary Individual Treatment Plan  Patient Details  Name: Daniel Oconnell MRN: [id number] Date of Birth: [birth year] Referring Provider:   [name] Pulmonary Rehab Walk Test from 12/22/2021 in [place]  Referring Provider [name]       Initial Encounter Date:  Flowsheet Row Pulmonary Rehab Walk Test from 12/22/2021 in [place]  Date 12/22/21       Visit Diagnosis: Chronic respiratory failure with hypoxia ([place])  Patient's Home Medications on Admission:   Current Outpatient Medications:    acetaminophen (TYLENOL) 500 MG tablet, Take 500 mg by mouth every 4 (four) hours as needed for mild pain, moderate pain, fever or headache., Disp: , Rfl:    albuterol (PROVENTIL) (2.5 MG/3ML) 0.083% nebulizer solution, Take 3 mLs (2.5 mg total) by nebulization every 4 (four) hours as needed for wheezing or shortness of breath., Disp: 75 mL, Rfl: 0   albuterol (VENTOLIN HFA) 108 (90 Base) MCG/ACT inhaler, Inhale 1-2 puffs into the lungs every 6 (six) hours as needed for wheezing or shortness of breath., Disp: 8 g, Rfl: 6   alum & mag hydroxide-simeth (MAALOX/MYLANTA) 200-200-20 MG/5ML suspension, Take 30 mLs by mouth as needed for indigestion or heartburn., Disp: , Rfl:    ASPIRIN 81 PO, Take 81 mg by mouth daily., Disp: , Rfl:    atorvastatin (LIPITOR) 80 MG tablet, Take 80 mg by mouth at bedtime. , Disp: , Rfl:    baclofen (LIORESAL) 20 MG tablet, Take 20 mg by mouth 3 (three) times daily., Disp: , Rfl:    BREZTRI AEROSPHERE 160-9-4.8 MCG/ACT AERO, Inhale into the lungs., Disp: , Rfl:    clopidogrel (PLAVIX) 75 MG tablet, Take 75 mg by mouth daily with breakfast. , Disp: , Rfl:    dicyclomine (BENTYL) 10 MG capsule, Take 10 mg by mouth daily., Disp: , Rfl:    docusate sodium (COLACE) 100 MG capsule, Take 100 mg by mouth 2 (two) times daily., Disp: , Rfl:    furosemide (LASIX) 40 MG tablet, Take 1 tablet (40 mg total) by mouth daily.,  Disp: 90 tablet, Rfl: 3   guaifenesin (ROBITUSSIN) 100 MG/5ML syrup, Take 200 mg by mouth every 6 (six) hours as needed for cough., Disp: , Rfl:    hydrALAZINE (APRESOLINE) 100 MG tablet, Take 1 tablet (100 mg total) by mouth 3 (three) times daily., Disp: 270 tablet, Rfl: 3   levETIRAcetam (KEPPRA) 750 MG tablet, Take 750 mg by mouth 2 (two) times daily., Disp: , Rfl:    levothyroxine (SYNTHROID, LEVOTHROID) 25 MCG tablet, Take 1 tablet by mouth daily., Disp: , Rfl:    loperamide (IMODIUM) 2 MG capsule, Take by mouth as needed for diarrhea or loose stools., Disp: , Rfl:    magnesium hydroxide (MILK OF MAGNESIA) 400 MG/5ML suspension, Take 30 mLs by mouth at bedtime as needed for mild constipation., Disp: , Rfl:    melatonin 3 MG TABS tablet, Take 3 mg by mouth at bedtime., Disp: , Rfl:    mometasone (NASONEX) 50 MCG/ACT nasal spray, SMARTSIG:Both Nares, Disp: , Rfl:    neomycin-bacitracin-polymyxin (NEOSPORIN) ointment, Apply 1 application. topically as needed for wound care., Disp: , Rfl:    Polyethyl Glycol-Propyl Glycol (SYSTANE ULTRA) 0.4-0.3 % SOLN, Apply 1 drop to eye 3 (three) times daily., Disp: , Rfl:    polyethylene glycol powder (GLYCOLAX/MIRALAX) powder, Take 17 g by mouth 2 (two) times daily. Use for constipation, as prescribed, until daily soft stools  OTC, Disp: 119  g, Rfl: 0   potassium chloride (KLOR-CON M) 10 MEQ tablet, Take 2 tablets (20 mEq total) by mouth daily., Disp: 180 tablet, Rfl: 3   sertraline (ZOLOFT) 100 MG tablet, Take 100 mg by mouth daily with breakfast. 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)    CAD (coronary artery disease) 2001   CABG   COPD (chronic obstructive pulmonary disease) ([place])    CVA (cerebral vascular accident) ([place]) 2001, 2013   x2   Expressive aphasia    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    Hyperlipidemia    Hypertension     Tobacco Use: Social History   Tobacco Use  Smoking Status Former   Packs/day: 1.00   Years: 28.00   Total pack years: 28.00   Types: Cigarettes   Quit date: 2013   Years since quitting: 10.7  Smokeless Tobacco Never    Labs: Review Flowsheet       Latest Ref Rng & Units 04/16/2016 03/10/2017 03/05/2020 07/24/2021  Labs for ITP Cardiac and Pulmonary Rehab  Cholestrol 100 - 199 mg/dL - 109  111  112   LDL (calc) 0 - 99 mg/dL - 47  52  59   HDL-C >39 mg/dL - 35  35  38   Trlycerides 0 - 149 mg/dL - 135  139  73   PH, Arterial 7.350 - 7.450 7.369  - - -  PCO2 arterial 32.0 - 48.0 mmHg 42.7  - - -  Bicarbonate 20.0 - 28.0 mmol/L 24.0  - - -  Acid-base deficit 0.0 - 2.0 mmol/L 0.9  - - -  O2 Saturation % 88.4  - - -    Capillary Blood Glucose: Lab Results  Component Value Date   GLUCAP 90 02/04/2019     Pulmonary Assessment Scores:  Pulmonary Assessment Scores     Row Name 12/22/21 1109         ADL UCSD   ADL Phase Entry     SOB Score total 106       CAT Score   CAT Score 30       mMRC Score   mMRC Score 4             UCSD: Self-administered rating of dyspnea associated with activities of daily living (ADLs) 6-point scale (0 = "not at all" to 5 = "maximal or unable to do because of breathlessness")  Scoring Scores range from 0 to 120.  Minimally important difference is 5 units  CAT: CAT can identify the health impairment of COPD patients and is better correlated with disease progression.  CAT has a scoring range of zero to 40. The CAT score is classified  into four groups of low (less than 10), medium (10 - 20), high (21-30) and very high (31-40) based on the impact level of disease on health status. A CAT score over 10 suggests significant symptoms.  A worsening CAT  score could be explained by an exacerbation, poor medication adherence, poor inhaler technique, or progression of COPD or comorbid conditions.  CAT MCID is 2 points  mMRC: mMRC (Modified Medical Research Council) Dyspnea Scale is used to assess the degree of baseline functional disability in patients of respiratory disease due to dyspnea. No minimal important difference is established. A decrease in score of 1 point or greater is considered a positive change.   Pulmonary Function Assessment:  Pulmonary Function Assessment - 12/22/21 1137       Breath   Bilateral Breath Sounds Clear;Decreased    Shortness of Breath Yes;Fear of Shortness of Breath;Limiting activity;Panic with Shortness of Breath             Exercise Target Goals: Exercise Program Goal: Individual exercise prescription set using results from initial 6 min walk test and THRR while considering  patient's activity barriers and safety.   Exercise Prescription Goal: Initial exercise prescription builds to 30-45 minutes a day of aerobic activity, 2-3 days per week.  Home exercise guidelines will be given to patient during program as part of exercise prescription that the participant will acknowledge.  Activity Barriers & Risk Stratification:  Activity Barriers & Cardiac Risk Stratification - 12/22/21 1126       Activity Barriers & Cardiac Risk Stratification   Activity Barriers Arthritis;Back Problems;Neck/Spine Problems;History of Falls;Muscular Weakness;Deconditioning;Shortness of Breath   CVA in 2001, 2013            6 Minute Walk:  6 Minute Walk     Row Name 12/22/21 1238         6 Minute Walk   Phase Initial     Distance 180 feet     Walk Time 6 minutes     # of Rest Breaks 3  0:34-1:132,  2:39-5:00, 5:30-6:00     MPH 0.34     METS 1.18     RPE 19     Perceived Dyspnea  4     VO2 Peak 4.12     Symptoms No     Resting HR 69 bpm     Resting BP 122/66     Resting Oxygen Saturation  90 %     Exercise Oxygen Saturation  during 6 min walk 91 %     Max Ex. HR 85 bpm     Max Ex. BP 156/78     2 Minute Post BP 130/76       Interval HR   1 Minute HR 76     2 Minute HR 78     3 Minute HR 73     4 Minute HR 62     5 Minute HR 73     6 Minute HR 85     2 Minute Post HR 62     Interval Heart Rate? Yes       Interval Oxygen   Interval Oxygen? Yes     Baseline Oxygen Saturation % 90 %     1 Minute Oxygen Saturation % 94 %     1 Minute Liters of Oxygen 3 L     2 Minute Oxygen Saturation % 92 %     2 Minute Liters of Oxygen 3 L     3 Minute Oxygen Saturation % 93 %     3 Minute Liters of Oxygen 3 L     4 Minute Oxygen Saturation % 95 %     4 Minute Liters of Oxygen 3 L  5 Minute Oxygen Saturation % 97 %     5 Minute Liters of Oxygen 3 L     6 Minute Oxygen Saturation % 91 %     6 Minute Liters of Oxygen 3 L     2 Minute Post Oxygen Saturation % 96 %     2 Minute Post Liters of Oxygen 3 L              Oxygen Initial Assessment:  Oxygen Initial Assessment - 12/22/21 1122       Home Oxygen   Home Oxygen Device Portable Concentrator;Home Concentrator    Sleep Oxygen Prescription Continuous    Liters per minute 3    Home Exercise Oxygen Prescription Pulsed    Liters per minute 4    Home Resting Oxygen Prescription Continuous    Liters per minute 3    Compliance with Home Oxygen Use Yes      Initial 6 min Walk   Oxygen Used Continuous    Liters per minute 3      Program Oxygen Prescription   Program Oxygen Prescription Continuous    Liters per minute 3      Intervention   Short Term Goals To learn and exhibit compliance with exercise, home and travel O2 prescription;To learn and understand importance of monitoring SPO2 with pulse oximeter and  demonstrate accurate use of the pulse oximeter.;To learn and understand importance of maintaining oxygen saturations>88%;To learn and demonstrate proper pursed lip breathing techniques or other breathing techniques. ;To learn and demonstrate proper use of respiratory medications    Long  Term Goals Exhibits compliance with exercise, home  and travel O2 prescription;Verbalizes importance of monitoring SPO2 with pulse oximeter and return demonstration;Maintenance of O2 saturations>88%;Exhibits proper breathing techniques, such as pursed lip breathing or other method taught during program session;Compliance with respiratory medication;Demonstrates proper use of MDI's             Oxygen Re-Evaluation:  Oxygen Re-Evaluation     Row Name 12/23/21 0912 01/14/22 1156 02/09/22 1013         Program Oxygen Prescription   Program Oxygen Prescription Continuous Continuous Continuous     Liters per minute $RemoveBe'3 3 3       '[id number]$ Home Oxygen   Home Oxygen Device Portable Concentrator;Home Concentrator Portable Concentrator;Home Concentrator Portable Concentrator;Home Concentrator     Sleep Oxygen Prescription Continuous Continuous Continuous     Liters per minute $RemoveBe'3 3 3     '[id number]$ Home Exercise Oxygen Prescription Pulsed Pulsed Pulsed     Liters per minute $RemoveBe'4 4 4     '[id number]$ Home Resting Oxygen Prescription Continuous Continuous Continuous     Liters per minute $RemoveBe'3 3 3     '[id number]$ Compliance with Home Oxygen Use Yes Yes Yes       Goals/Expected Outcomes   Short Term Goals To learn and exhibit compliance with exercise, home and travel O2 prescription;To learn and understand importance of monitoring SPO2 with pulse oximeter and demonstrate accurate use of the pulse oximeter.;To learn and understand importance of maintaining oxygen saturations>88%;To learn and demonstrate proper pursed lip breathing techniques or other breathing techniques. ;To learn and demonstrate proper use of respiratory medications To learn and exhibit compliance with  exercise, home and travel O2 prescription;To learn and understand importance of monitoring SPO2 with pulse oximeter and demonstrate accurate use of the pulse oximeter.;To learn and understand importance of maintaining oxygen saturations>88%;To learn and demonstrate proper pursed lip breathing techniques or other breathing techniques. ;To learn  and demonstrate proper use of respiratory medications To learn and exhibit compliance with exercise, home and travel O2 prescription;To learn and understand importance of monitoring SPO2 with pulse oximeter and demonstrate accurate use of the pulse oximeter.;To learn and understand importance of maintaining oxygen saturations>88%;To learn and demonstrate proper pursed lip breathing techniques or other breathing techniques. ;To learn and demonstrate proper use of respiratory medications     Long  Term Goals Exhibits compliance with exercise, home  and travel O2 prescription;Verbalizes importance of monitoring SPO2 with pulse oximeter and return demonstration;Maintenance of O2 saturations>88%;Exhibits proper breathing techniques, such as pursed lip breathing or other method taught during program session;Compliance with respiratory medication;Demonstrates proper use of MDI's Exhibits compliance with exercise, home  and travel O2 prescription;Verbalizes importance of monitoring SPO2 with pulse oximeter and return demonstration;Maintenance of O2 saturations>88%;Exhibits proper breathing techniques, such as pursed lip breathing or other method taught during program session;Compliance with respiratory medication;Demonstrates proper use of MDI's Exhibits compliance with exercise, home  and travel O2 prescription;Verbalizes importance of monitoring SPO2 with pulse oximeter and return demonstration;Maintenance of O2 saturations>88%;Exhibits proper breathing techniques, such as pursed lip breathing or other method taught during program session;Compliance with respiratory  medication;Demonstrates proper use of MDI's     Goals/Expected Outcomes Compliance and understanding of oxygen saturation monitoring and breathing techniques to decrease shortness of breath. Compliance and understanding of oxygen saturation monitoring and breathing techniques to decrease shortness of breath. Compliance and understanding of oxygen saturation monitoring and breathing techniques to decrease shortness of breath.              Oxygen Discharge (Final Oxygen Re-Evaluation):  Oxygen Re-Evaluation - 02/09/22 1013       Program Oxygen Prescription   Program Oxygen Prescription Continuous    Liters per minute 3      Home Oxygen   Home Oxygen Device Portable Concentrator;Home Concentrator    Sleep Oxygen Prescription Continuous    Liters per minute 3    Home Exercise Oxygen Prescription Pulsed    Liters per minute 4    Home Resting Oxygen Prescription Continuous    Liters per minute 3    Compliance with Home Oxygen Use Yes      Goals/Expected Outcomes   Short Term Goals To learn and exhibit compliance with exercise, home and travel O2 prescription;To learn and understand importance of monitoring SPO2 with pulse oximeter and demonstrate accurate use of the pulse oximeter.;To learn and understand importance of maintaining oxygen saturations>88%;To learn and demonstrate proper pursed lip breathing techniques or other breathing techniques. ;To learn and demonstrate proper use of respiratory medications    Long  Term Goals Exhibits compliance with exercise, home  and travel O2 prescription;Verbalizes importance of monitoring SPO2 with pulse oximeter and return demonstration;Maintenance of O2 saturations>88%;Exhibits proper breathing techniques, such as pursed lip breathing or other method taught during program session;Compliance with respiratory medication;Demonstrates proper use of MDI's    Goals/Expected Outcomes Compliance and understanding of oxygen saturation monitoring and  breathing techniques to decrease shortness of breath.             Initial Exercise Prescription:  Initial Exercise Prescription - 12/22/21 1200       Date of Initial Exercise RX and Referring Provider   Date 12/22/21    Referring Provider [name]    Expected Discharge Date 02/26/22      Oxygen   Oxygen Continuous    Liters 3    Maintain Oxygen Saturation 88% or higher  Recumbant Bike   Level 1    RPM 20    Minutes 25      Prescription Details   Frequency (times per week) 2    Duration Progress to 30 minutes of continuous aerobic without signs/symptoms of physical distress      Intensity   THRR 40-80% of Max Heartrate 61-122    Ratings of Perceived Exertion 11-13    Perceived Dyspnea 0-4      Progression   Progression Continue to progress workloads to maintain intensity without signs/symptoms of physical distress.      Resistance Training   Training Prescription Yes    Weight yellow bands    Reps 10-15             Perform Capillary Blood Glucose checks as needed.  Exercise Prescription Changes:   Exercise Prescription Changes     Row Name 01/06/22 1500 01/15/22 1519 01/22/22 1500 02/03/22 1500       Response to Exercise   Blood Pressure (Admit) 124/64 124/60 -- 132/66    Blood Pressure (Exercise) 132/60 -- -- 140/70    Blood Pressure (Exit) 130/66 112/54 -- 130/70    Heart Rate (Admit) 94 bpm 83 bpm -- 78 bpm    Heart Rate (Exercise) 92 bpm 91 bpm -- 85 bpm    Heart Rate (Exit) 76 bpm 77 bpm -- 75 bpm    Oxygen Saturation (Admit) 92 % 95 % -- 96 %    Oxygen Saturation (Exercise) 92 % 94 % -- 94 %    Oxygen Saturation (Exit) 96 % 98 % -- 96 %    Rating of Perceived Exertion (Exercise) 15 13 -- 15    Perceived Dyspnea (Exercise) 2 2 -- 2    Duration Continue with 30 min of aerobic exercise without signs/symptoms of physical distress. Continue with 30 min of aerobic exercise without signs/symptoms of physical distress. -- Continue with 30 min of  aerobic exercise without signs/symptoms of physical distress.    Intensity Other (comment)  40-80% HRR THRR unchanged -- THRR unchanged      Progression   Progression -- -- -- Continue to progress workloads to maintain intensity without signs/symptoms of physical distress.      Resistance Training   Training Prescription Yes Yes -- Yes    Weight yellow bands yellow bands -- yellow bands    Reps 10-15 10-15 -- 10-15    Time 10 Minutes 10 Minutes -- 10 Minutes      Interval Training   Interval Training -- -- -- No      Oxygen   Oxygen Continuous Continuous -- Continuous    Liters 3 3 -- 4      Recumbant Bike   Level 1.5 1.5 -- 2    Minutes 15 15 -- 15    METs 1.5 1.6 -- 1.6      NuStep   Level 2 3 -- 4    SPM 80 80 -- 80    Minutes 15 15 -- 15    METs 1.6 1.6 -- 1.7      Home Exercise Plan   Plans to continue exercise at -- -- Home (comment)  bike, walking --    Frequency -- -- Add 2 additional days to program exercise sessions. --    Initial Home Exercises Provided -- -- 01/22/22 --      Oxygen   Maintain Oxygen Saturation -- -- -- 88% or higher  Exercise Comments:   Exercise Comments     Row Name 12/30/21 1520 01/22/22 1537         Exercise Comments Pt completed first day of exercise. He exercised for 25 min on the recumbent bike. He took a couple of rest breaks. Daniel Oconnell is very deconditioned and limited by his CVA. He performed most of the warmup. Daniel Oconnell did have to take rest breaks during the warmup. Staff did 1-on-1 cooldown with him. Completed pts home exercise plan. He is not currently exercising at home. He plans to start riding bike or walking 5 min to build up to 30 eventually. He plans to add 2 nonrehab days.               Exercise Goals and Review:   Exercise Goals     Row Name 12/22/21 1127 12/23/21 0911 01/14/22 1150 02/09/22 1010       Exercise Goals   Increase Physical Activity Yes Yes Yes Yes    Intervention Provide advice,  education, support and counseling about physical activity/exercise needs.;Develop an individualized exercise prescription for aerobic and resistive training based on initial evaluation findings, risk stratification, comorbidities and participant's personal goals. Provide advice, education, support and counseling about physical activity/exercise needs.;Develop an individualized exercise prescription for aerobic and resistive training based on initial evaluation findings, risk stratification, comorbidities and participant's personal goals. Provide advice, education, support and counseling about physical activity/exercise needs.;Develop an individualized exercise prescription for aerobic and resistive training based on initial evaluation findings, risk stratification, comorbidities and participant's personal goals. Provide advice, education, support and counseling about physical activity/exercise needs.;Develop an individualized exercise prescription for aerobic and resistive training based on initial evaluation findings, risk stratification, comorbidities and participant's personal goals.    Expected Outcomes Short Term: Attend rehab on a regular basis to increase amount of physical activity.;Long Term: Add in home exercise to make exercise part of routine and to increase amount of physical activity.;Long Term: Exercising regularly at least 3-5 days a week. Short Term: Attend rehab on a regular basis to increase amount of physical activity.;Long Term: Add in home exercise to make exercise part of routine and to increase amount of physical activity.;Long Term: Exercising regularly at least 3-5 days a week. Short Term: Attend rehab on a regular basis to increase amount of physical activity.;Long Term: Add in home exercise to make exercise part of routine and to increase amount of physical activity.;Long Term: Exercising regularly at least 3-5 days a week. Short Term: Attend rehab on a regular basis to increase amount  of physical activity.;Long Term: Add in home exercise to make exercise part of routine and to increase amount of physical activity.;Long Term: Exercising regularly at least 3-5 days a week.    Increase Strength and Stamina Yes Yes Yes Yes    Intervention Provide advice, education, support and counseling about physical activity/exercise needs.;Develop an individualized exercise prescription for aerobic and resistive training based on initial evaluation findings, risk stratification, comorbidities and participant's personal goals. Provide advice, education, support and counseling about physical activity/exercise needs.;Develop an individualized exercise prescription for aerobic and resistive training based on initial evaluation findings, risk stratification, comorbidities and participant's personal goals. Provide advice, education, support and counseling about physical activity/exercise needs.;Develop an individualized exercise prescription for aerobic and resistive training based on initial evaluation findings, risk stratification, comorbidities and participant's personal goals. Provide advice, education, support and counseling about physical activity/exercise needs.;Develop an individualized exercise prescription for aerobic and resistive training based on initial evaluation findings, risk stratification,  comorbidities and participant's personal goals.    Expected Outcomes Short Term: Increase workloads from initial exercise prescription for resistance, speed, and METs.;Short Term: Perform resistance training exercises routinely during rehab and add in resistance training at home;Long Term: Improve cardiorespiratory fitness, muscular endurance and strength as measured by increased METs and functional capacity (6MWT) Short Term: Increase workloads from initial exercise prescription for resistance, speed, and METs.;Short Term: Perform resistance training exercises routinely during rehab and add in resistance  training at home;Long Term: Improve cardiorespiratory fitness, muscular endurance and strength as measured by increased METs and functional capacity (6MWT) Short Term: Increase workloads from initial exercise prescription for resistance, speed, and METs.;Short Term: Perform resistance training exercises routinely during rehab and add in resistance training at home;Long Term: Improve cardiorespiratory fitness, muscular endurance and strength as measured by increased METs and functional capacity (6MWT) Short Term: Increase workloads from initial exercise prescription for resistance, speed, and METs.;Short Term: Perform resistance training exercises routinely during rehab and add in resistance training at home;Long Term: Improve cardiorespiratory fitness, muscular endurance and strength as measured by increased METs and functional capacity (6MWT)    Able to understand and use rate of perceived exertion (RPE) scale Yes Yes Yes Yes    Intervention Provide education and explanation on how to use RPE scale Provide education and explanation on how to use RPE scale Provide education and explanation on how to use RPE scale Provide education and explanation on how to use RPE scale    Expected Outcomes Short Term: Able to use RPE daily in rehab to express subjective intensity level;Long Term:  Able to use RPE to guide intensity level when exercising independently Short Term: Able to use RPE daily in rehab to express subjective intensity level;Long Term:  Able to use RPE to guide intensity level when exercising independently Short Term: Able to use RPE daily in rehab to express subjective intensity level;Long Term:  Able to use RPE to guide intensity level when exercising independently Short Term: Able to use RPE daily in rehab to express subjective intensity level;Long Term:  Able to use RPE to guide intensity level when exercising independently    Able to understand and use Dyspnea scale Yes Yes Yes Yes    Intervention  Provide education and explanation on how to use Dyspnea scale Provide education and explanation on how to use Dyspnea scale Provide education and explanation on how to use Dyspnea scale Provide education and explanation on how to use Dyspnea scale    Expected Outcomes Short Term: Able to use Dyspnea scale daily in rehab to express subjective sense of shortness of breath during exertion;Long Term: Able to use Dyspnea scale to guide intensity level when exercising independently Short Term: Able to use Dyspnea scale daily in rehab to express subjective sense of shortness of breath during exertion;Long Term: Able to use Dyspnea scale to guide intensity level when exercising independently Short Term: Able to use Dyspnea scale daily in rehab to express subjective sense of shortness of breath during exertion;Long Term: Able to use Dyspnea scale to guide intensity level when exercising independently Short Term: Able to use Dyspnea scale daily in rehab to express subjective sense of shortness of breath during exertion;Long Term: Able to use Dyspnea scale to guide intensity level when exercising independently    Knowledge and understanding of Target Heart Rate Range (THRR) Yes Yes Yes Yes    Intervention Provide education and explanation of THRR including how the numbers were predicted and where they are located for  reference Provide education and explanation of THRR including how the numbers were predicted and where they are located for reference Provide education and explanation of THRR including how the numbers were predicted and where they are located for reference Provide education and explanation of THRR including how the numbers were predicted and where they are located for reference    Expected Outcomes Short Term: Able to state/look up THRR;Long Term: Able to use THRR to govern intensity when exercising independently;Short Term: Able to use daily as guideline for intensity in rehab Short Term: Able to state/look  up THRR;Long Term: Able to use THRR to govern intensity when exercising independently;Short Term: Able to use daily as guideline for intensity in rehab Short Term: Able to state/look up THRR;Long Term: Able to use THRR to govern intensity when exercising independently;Short Term: Able to use daily as guideline for intensity in rehab Short Term: Able to state/look up THRR;Long Term: Able to use THRR to govern intensity when exercising independently;Short Term: Able to use daily as guideline for intensity in rehab    Understanding of Exercise Prescription Yes Yes Yes Yes    Intervention Provide education, explanation, and written materials on patient's individual exercise prescription Provide education, explanation, and written materials on patient's individual exercise prescription Provide education, explanation, and written materials on patient's individual exercise prescription Provide education, explanation, and written materials on patient's individual exercise prescription    Expected Outcomes Short Term: Able to explain program exercise prescription;Long Term: Able to explain home exercise prescription to exercise independently Short Term: Able to explain program exercise prescription;Long Term: Able to explain home exercise prescription to exercise independently Short Term: Able to explain program exercise prescription;Long Term: Able to explain home exercise prescription to exercise independently Short Term: Able to explain program exercise prescription;Long Term: Able to explain home exercise prescription to exercise independently             Exercise Goals Re-Evaluation :  Exercise Goals Re-Evaluation     Row Name 12/23/21 0911 01/14/22 1150 02/09/22 1010         Exercise Goal Re-Evaluation   Exercise Goals Review Increase Physical Activity;Increase Strength and Stamina;Able to understand and use rate of perceived exertion (RPE) scale;Able to understand and use Dyspnea scale;Knowledge and  understanding of Target Heart Rate Range (THRR);Understanding of Exercise Prescription Increase Physical Activity;Increase Strength and Stamina;Able to understand and use rate of perceived exertion (RPE) scale;Able to understand and use Dyspnea scale;Knowledge and understanding of Target Heart Rate Range (THRR);Understanding of Exercise Prescription Increase Physical Activity;Increase Strength and Stamina;Able to understand and use rate of perceived exertion (RPE) scale;Able to understand and use Dyspnea scale;Knowledge and understanding of Target Heart Rate Range (THRR);Understanding of Exercise Prescription     Comments Daniel Oconnell is scheduled to begin exercise next week. Will continue to monitor and progress as able. Daniel Oconnell has completed 5 exercise sessions. He is exercising on the recumbent bike for 15 min, level 1.5 at 1.6 METs. He is then exercising on the nustep for 15 min at level 2 for 1.6 METs. His METs have not progressed yet. He is performing modified warm up and cool down with one on one verbal and demonstrative cues tolerating well. Will discuss home exercise soon. Will continue to monitor and progress as tolerated. Daniel Oconnell has completed 11 exercise sessions. He is exercising on the recumbent bike for 15 min, level 4 at 1.9 METs. He is then exercising on the nustep for 15 min at level 4 for 1.8 METs. Although he has  increased his workload, METs have not progressed substantially. He is performing modified warm up and cool down with one on one verbal and demonstrative cues tolerating well. Home exercise has been discussed. Daniel Oconnell is motivated in class but unsure if he will be motivated at home. Will continue to monitor and progress as tolerated.     Expected Outcomes Through exercise at rehab and home, the patient will decrease shortness of breath with daily activities and feel confident in carrying out an exercise regimen at home. Through exercise at rehab and home, the patient will decrease shortness of breath  with daily activities and feel confident in carrying out an exercise regimen at home. Through exercise at rehab and home, the patient will decrease shortness of breath with daily activities and feel confident in carrying out an exercise regimen at home.              Discharge Exercise Prescription (Final Exercise Prescription Changes):  Exercise Prescription Changes - 02/03/22 1500       Response to Exercise   Blood Pressure (Admit) 132/66    Blood Pressure (Exercise) 140/70    Blood Pressure (Exit) 130/70    Heart Rate (Admit) 78 bpm    Heart Rate (Exercise) 85 bpm    Heart Rate (Exit) 75 bpm    Oxygen Saturation (Admit) 96 %    Oxygen Saturation (Exercise) 94 %    Oxygen Saturation (Exit) 96 %    Rating of Perceived Exertion (Exercise) 15    Perceived Dyspnea (Exercise) 2    Duration Continue with 30 min of aerobic exercise without signs/symptoms of physical distress.    Intensity THRR unchanged      Progression   Progression Continue to progress workloads to maintain intensity without signs/symptoms of physical distress.      Resistance Training   Training Prescription Yes    Weight yellow bands    Reps 10-15    Time 10 Minutes      Interval Training   Interval Training No      Oxygen   Oxygen Continuous    Liters 4      Recumbant Bike   Level 2    Minutes 15    METs 1.6      NuStep   Level 4    SPM 80    Minutes 15    METs 1.7      Oxygen   Maintain Oxygen Saturation 88% or higher             Nutrition:  Target Goals: Understanding of nutrition guidelines, daily intake of sodium '1500mg'$ , cholesterol '200mg'$ , calories 30% from fat and 7% or less from saturated fats, daily to have 5 or more servings of fruits and vegetables.  Biometrics:    Nutrition Therapy Plan and Nutrition Goals:  Nutrition Therapy & Goals - 01/27/22 1452       Nutrition Therapy   Diet Heart healthy/general healthy diet   patient lives in assisted living and is  prescribed a general diet there   Drug/Food Interactions Statins/Certain Fruits      Personal Nutrition Goals   Nutrition Goal Patient to choose a daily variety of fruits, vegetables, whole grains, lean protein/plant protein, nonfat/lowfat dairy as part of heart healthy lifestyle    Personal Goal #2 Patient to limit to '2300mg'$ -$RemoveBe'1500mg'EvRQdvbEo$  of sodium daily    Comments Patient diagnosed with expressive aphasia. His ex-wife and support, Daniel Oconnell, was present today. She reports that Daniel Oconnell lives at [facility]; he  eats three meals per day and 2 snacks and is prescribed a general diet. They do eat out ~3x/week together and he enjoys sweets daily. Weight is stable with appropriate appetite. Lipids WNL, HDL 38      Intervention Plan   Intervention Prescribe, educate and counsel regarding individualized specific dietary modifications aiming towards targeted core components such as weight, hypertension, lipid management, diabetes, heart failure and other comorbidities.    Expected Outcomes Short Term Goal: Understand basic principles of dietary content, such as calories, fat, sodium, cholesterol and nutrients.;Long Term Goal: Adherence to prescribed nutrition plan.             Nutrition Assessments:  Nutrition Assessments - 12/30/21 1432       Rate Your Plate Scores   Pre Score 50   some uncertainty of cooking methods/fats as he lives in assisted living and does not do his own cooking           MEDIFICTS Score Key: ?70 Need to make dietary changes  40-70 Heart Healthy Diet ? 40 Therapeutic Level Cholesterol Diet  Flowsheet Row PULMONARY REHAB OTHER RESPIRATORY from 12/30/2021 in [place]  Picture Your Plate Total Score on Admission 50      Picture Your Plate Scores: <12 Unhealthy dietary pattern with much room for improvement. 41-50 Dietary pattern unlikely to meet recommendations for good health and room for improvement. 51-60 More healthful  dietary pattern, with some room for improvement.  >60 Healthy dietary pattern, although there may be some specific behaviors that could be improved.    Nutrition Goals Re-Evaluation:  Nutrition Goals Re-Evaluation     Daniel Oconnell Name 12/30/21 1417 01/27/22 1452           Goals   Current Weight 184 lb 15.5 oz (83.9 kg) 182 lb 5.1 oz (82.7 kg)      Comment Patient diagnosed with expressive aphasia. His ex-wife and support, Daniel Oconnell, was present today. She reports that Daniel Oconnell lives at [facility]; he eats three meals per day and 2 snacks and is prescribed a general diet. They do eat out ~3x/week together and he enjoys sweets daily. Weight is stable with appropriate appetite. Lipids WNL, HDL 38 He is down 3# since starting with our program.      Expected Outcome -- Patient continues with healthy diet provided by [facility]; he eats three meals per day and 2 snacks and is prescribed a general diet. He continues to eat out 3x per week with his family.               Nutrition Goals Discharge (Final Nutrition Goals Re-Evaluation):  Nutrition Goals Re-Evaluation - 01/27/22 1452       Goals   Current Weight 182 lb 5.1 oz (82.7 kg)    Comment He is down 3# since starting with our program.    Expected Outcome Patient continues with healthy diet provided by [facility]; he eats three meals per day and 2 snacks and is prescribed a general diet. He continues to eat out 3x per week with his family.             Psychosocial: Target Goals: Acknowledge presence or absence of significant depression and/or stress, maximize coping skills, provide positive support system. Participant is able to verbalize types and ability to use techniques and skills needed for reducing stress and depression.  Initial Review & Psychosocial Screening:  Initial Psych Review & Screening - 12/22/21 1119  Initial Review   Current issues with None Identified      Family Dynamics    Good Support System? Yes    Comments Daniel Oconnell      Barriers   Psychosocial barriers to participate in program There are no identifiable barriers or psychosocial needs.      Screening Interventions   Interventions Encouraged to exercise             Quality of Life Scores:  Scores of 19 and below usually indicate a poorer quality of life in these areas.  A difference of  2-3 points is a clinically meaningful difference.  A difference of 2-3 points in the total score of the Quality of Life Index has been associated with significant improvement in overall quality of life, self-image, physical symptoms, and general health in studies assessing change in quality of life.  PHQ-9: Review Flowsheet       12/22/2021  Depression screen PHQ 2/9  Decreased Interest 0  Down, Depressed, Hopeless 0  PHQ - 2 Score 0  Altered sleeping 0  Tired, decreased energy 0  Change in appetite 0  Feeling bad or failure about yourself  0  Trouble concentrating 0  Moving slowly or fidgety/restless 0  Suicidal thoughts 0  PHQ-9 Score 0   Interpretation of Total Score  Total Score Depression Severity:  1-4 = Minimal depression, 5-9 = Mild depression, 10-14 = Moderate depression, 15-19 = Moderately severe depression, 20-27 = Severe depression   Psychosocial Evaluation and Intervention:  Psychosocial Evaluation - 12/22/21 1119       Psychosocial Evaluation & Interventions   Interventions Encouraged to exercise with the program and follow exercise prescription    Expected Outcomes For Daniel Oconnell to participate in Pulmonary Rehab    Continue Psychosocial Services  Follow up required by staff             Psychosocial Re-Evaluation:  Psychosocial Re-Evaluation     [place] Name 12/24/21 0912 01/13/22 1550 02/11/22 1044         Psychosocial Re-Evaluation   Current issues with None Identified None Identified None Identified     Comments Daniel Oconnell has attented the PR orientation session 12/22/21. He is scheduled to  start attending PR 12/30/21. Will continue to monitor for any psychosocial barriers. No psychosocial concerns or barriers have been identified at this time for Daniel Oconnell.  His exwife is very supportive and drives him to all his exercise classes. Daniel Oconnell has been attending the PR program withotu any psychosocial barriers. He has a very supportive ex wife who drives him to the program and is very involved in his care.Although he lives at an assited living facility she continues to be supportive in all of his care.     Expected Outcomes For him to participate in the program without any psychosocial issues or concerns. For Daniel Oconnell to continue to be without psychosocial concerns while participating in pulmonary rehab. For him to continue to attend the porgram without any psychosocial issues or concerns.     Interventions Encouraged to attend Pulmonary Rehabilitation for the exercise Encouraged to attend Pulmonary Rehabilitation for the exercise Encouraged to attend Pulmonary Rehabilitation for the exercise     Continue Psychosocial Services  Follow up required by staff No Follow up required No Follow up required              Psychosocial Discharge (Final Psychosocial Re-Evaluation):  Psychosocial Re-Evaluation - 02/11/22 1044       Psychosocial Re-Evaluation   Current issues  with None Identified    Comments Daniel Oconnell has been attending the PR program withotu any psychosocial barriers. He has a very supportive ex wife who drives him to the program and is very involved in his care.Although he lives at an assited living facility she continues to be supportive in all of his care.    Expected Outcomes For him to continue to attend the porgram without any psychosocial issues or concerns.    Interventions Encouraged to attend Pulmonary Rehabilitation for the exercise    Continue Psychosocial Services  No Follow up required             Education: Education Goals: Education classes will be provided on a weekly basis,  covering required topics. Participant will state understanding/return demonstration of topics presented.  Learning Barriers/Preferences:  Learning Barriers/Preferences - 12/22/21 1120       Learning Barriers/Preferences   Learning Barriers --   CVA  in 2001 and 2013   Learning Preferences None             Education Topics: Introduction to Pulmonary Rehab Group instruction provided by PowerPoint, verbal discussion, and written material to support subject matter. Instructor reviews what Pulmonary Rehab is, the purpose of the program, and how patients are referred.     Know Your Numbers Group instruction that is supported by a PowerPoint presentation. Instructor discusses importance of knowing and understanding resting, exercise, and post-exercise oxygen saturation, heart rate, and blood pressure. Oxygen saturation, heart rate, blood pressure, rating of perceived exertion, and dyspnea are reviewed along with a normal range for these values.    Exercise for the Pulmonary Patient Group instruction that is supported by a PowerPoint presentation. Instructor discusses benefits of exercise, core components of exercise, frequency, duration, and intensity of an exercise routine, importance of utilizing pulse oximetry during exercise, safety while exercising, and options of places to exercise outside of rehab.       MET Level  Group instruction provided by PowerPoint, verbal discussion, and written material to support subject matter. Instructor reviews what METs are and how to increase METs.    Pulmonary Medications Verbally interactive group education provided by instructor with focus on inhaled medications and proper administration. Flowsheet Row PULMONARY REHAB OTHER RESPIRATORY from 02/05/2022 in [place]  Date 01/08/22  Educator Donnetta Simpers  Instruction Review Code 1- Verbalizes Understanding       Anatomy and Physiology of the Respiratory  System Group instruction provided by PowerPoint, verbal discussion, and written material to support subject matter. Instructor reviews respiratory cycle and anatomical components of the respiratory system and their functions. Instructor also reviews differences in obstructive and restrictive respiratory diseases with examples of each.  Flowsheet Row PULMONARY REHAB OTHER RESPIRATORY from 02/05/2022 in [place]  Date 01/15/22  Educator Donnetta Simpers  Instruction Review Code 2- Demonstrated Understanding       Oxygen Safety Group instruction provided by PowerPoint, verbal discussion, and written material to support subject matter. There is an overview of "What is Oxygen" and "Why do we need it".  Instructor also reviews how to create a safe environment for oxygen use, the importance of using oxygen as prescribed, and the risks of noncompliance. There is a brief discussion on traveling with oxygen and resources the patient may utilize. Flowsheet Row PULMONARY REHAB OTHER RESPIRATORY from 02/05/2022 in [place]  Date 01/22/22  Educator Kelle Darting  Instruction Review Code 1- Verbalizes Understanding  Oxygen Use Group instruction provided by PowerPoint, verbal discussion, and written material to discuss how supplemental oxygen is prescribed and different types of oxygen supply systems. Resources for more information are provided.  Flowsheet Row PULMONARY REHAB OTHER RESPIRATORY from 02/05/2022 in [place]  Date 01/29/22  Educator Kelle Darting  Instruction Review Code 1- Verbalizes Understanding       Breathing Techniques Group instruction that is supported by demonstration and informational handouts. Instructor discusses the benefits of pursed lip and diaphragmatic breathing and detailed demonstration on how to perform both.  Flowsheet Row PULMONARY REHAB OTHER RESPIRATORY from 02/05/2022 in [place]  Date 02/05/22  Educator EP- Kelle Darting  Instruction Review Code 1- Verbalizes Understanding        Risk Factor Reduction Group instruction that is supported by a PowerPoint presentation. Instructor discusses the definition of a risk factor, different risk factors for pulmonary disease, and how the heart and lungs work together.   MD Day A group question and answer session with a medical doctor that allows participants to ask questions that relate to their pulmonary disease state.   Nutrition for the Pulmonary Patient Group instruction provided by PowerPoint slides, verbal discussion, and written materials to support subject matter. The instructor gives an explanation and review of healthy diet recommendations, which includes a discussion on weight management, recommendations for fruit and vegetable consumption, as well as protein, fluid, caffeine, fiber, sodium, sugar, and alcohol. Tips for eating when patients are short of breath are discussed. Flowsheet Row PULMONARY REHAB OTHER RESPIRATORY from 02/05/2022 in [place]  Date 01/01/22  Educator Sam RD  [Handout]  Instruction Review Code 1- Verbalizes Understanding        Other Education Group or individual verbal, written, or video instructions that support the educational goals of the pulmonary rehab program.    Knowledge Questionnaire Score:  Knowledge Questionnaire Score - 12/22/21 1105       Knowledge Questionnaire Score   Pre Score 16/18             Core Components/Risk Factors/Patient Goals at Admission:  Personal Goals and Risk Factors at Admission - 12/22/21 1121       Core Components/Risk Factors/Patient Goals on Admission    Weight Management Weight Loss    Improve shortness of breath with ADL's Yes    Intervention Provide education, individualized exercise plan and daily activity instruction to help decrease symptoms of SOB with activities of  daily living.    Expected Outcomes Short Term: Improve cardiorespiratory fitness to achieve a reduction of symptoms when performing ADLs;Long Term: Be able to perform more ADLs without symptoms or delay the onset of symptoms             Core Components/Risk Factors/Patient Goals Review:   Goals and Risk Factor Review     Row Name 12/24/21 0916 01/13/22 1552 01/13/22 1557 02/11/22 1048       Core Components/Risk Factors/Patient Goals Review   Personal Goals Review Other Improve shortness of breath with ADL's;Weight Management/Obesity -- Weight Management/Obesity;Improve shortness of breath with ADL's;Develop more efficient breathing techniques such as purse lipped breathing and diaphragmatic breathing and practicing self-pacing with activity.    Review Weight loss has been a concern for him. He is scheduled to start PR 12/30/21. Will continue to monitor core components. Daniel Oconnell has attended 5 exercise sessions, and has lost no weight, but he is gaining strength and stamina.  He is challenged  by 2 prior strokes and he is unable to use his right arm and hand and walking is slow.   He is very determined and works very hard despite his severe shortness of breath with activity.  He is exercising @ 1.6 mets on the recumbent bike and nustep. -- Daniel Oconnell's weight has been the same throughout his time in the program. He has had no weight loss. He is exercising on the nustep and recumbent bike. 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 Daniel Oconnell reports mild to moderate SOB with exercise.    Expected Outcomes See admission goals. -- See admission goals See admission goals             Core Components/Risk Factors/Patient Goals at Discharge (Final Review):   Goals and Risk Factor  Review - 02/11/22 1048       Core Components/Risk Factors/Patient Goals Review   Personal Goals Review Weight Management/Obesity;Improve shortness of breath with ADL's;Develop more efficient breathing techniques such as purse lipped breathing and diaphragmatic breathing and practicing self-pacing with activity.    Review Gareld's weight has been the same throughout his time in the program. He has had no weight loss. He is exercising on the nustep and recumbent bike. 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 Lionardo reports mild to moderate SOB with exercise.    Expected Outcomes See admission goals             ITP Comments: Pt is making expected progress toward Pulmonary Rehab goals after completing 12 sessions. Recommend continued exercise, life style modification, education, and utilization of breathing techniques to increase stamina and strength, while also decreasing shortness of breath with exertion.  Dr. Rodman Pickle is Medical Director for Pulmonary Rehab at [facility].

## 2022-02-12 ENCOUNTER — Encounter (HOSPITAL_COMMUNITY)
Admission: RE | Admit: 2022-02-12 | Discharge: 2022-02-12 | Disposition: A | Discharge status: Home / Self Care | Payer: Medicare HMO | Source: Ambulatory Visit | Attending: Pulmonary Disease | Admitting: Pulmonary Disease

## 2022-02-12 DIAGNOSIS — J9611 Chronic respiratory failure with hypoxia: Secondary | ICD-10-CM | POA: Diagnosis not present

## 2022-02-12 NOTE — Progress Notes (Signed)
Daily Session Note  Patient Details  Name: Daniel Oconnell MRN: [id number] Date of Birth: 1955-06-15 Referring Provider:   [name] Pulmonary Rehab Walk Test from 12/22/2021 in [place]  Referring Provider [name]       Encounter Date: 02/12/2022  Check In:  Session Check In - 02/12/22 1438       Check-In   Supervising physician immediately available to respond to emergencies [facility] - Physician supervision    Physician(s) Dr. Arby Barrette    Location [facility]    Staff Present Elmon Else, MS, ACSM-CEP, Exercise Physiologist;Carlette Wilber Oliphant, RN, BSN;Randi Olen Cordial BS, ACSM-CEP, Exercise Physiologist;Elisheba Mcdonnell Ysidro Evert, RN    Virtual Visit No    Medication changes reported     No    Fall or balance concerns reported    No    Tobacco Cessation No Change    Warm-up and Cool-down Performed as group-led instruction    Resistance Training Performed Yes    VAD Patient? No    PAD/SET Patient? No      Pain Assessment   Currently in Pain? No/denies    Multiple Pain Sites No             Capillary Blood Glucose: No results found for this or any previous visit (from the past 24 hour(s)).    Social History   Tobacco Use  Smoking Status Former   Packs/day: 1.00   Years: 28.00   Total pack years: 28.00   Types: Cigarettes   Quit date: 2013   Years since quitting: 10.7  Smokeless Tobacco Never    Goals Met:  [place] associated with RPD/PD & O2 Sat Exercise tolerated well No report of concerns or symptoms today Strength training completed today  Goals Unmet:  Not Applicable  Comments: Service time is from 1323 to [place]    Dr. Rodman Pickle is Medical Director for Pulmonary Rehab at [facility].

## 2022-02-13 NOTE — Telephone Encounter (Signed)
Routing to PCC per protocol 

## 2022-02-13 NOTE — Telephone Encounter (Signed)
Eulas Post, Jasmine  [place], Allyne Gee, [place]; [place], Leory Plowman; [place], [place]; Marlowe Sax, Mardene Celeste This is set for delivery on Tuesday, October 3rd

## 2022-02-13 NOTE — Telephone Encounter (Signed)
Message sent to adapt 

## 2022-02-17 ENCOUNTER — Encounter (HOSPITAL_COMMUNITY)
Admission: RE | Admit: 2022-02-17 | Discharge: 2022-02-17 | Disposition: A | Discharge status: Home / Self Care | Payer: Medicare HMO | Source: Ambulatory Visit | Attending: Pulmonary Disease | Admitting: Pulmonary Disease

## 2022-02-17 VITALS — Wt 182.8 lb

## 2022-02-17 DIAGNOSIS — J9610 Chronic respiratory failure, unspecified whether with hypoxia or hypercapnia: Secondary | ICD-10-CM | POA: Diagnosis present

## 2022-02-17 DIAGNOSIS — J9611 Chronic respiratory failure with hypoxia: Secondary | ICD-10-CM

## 2022-02-17 NOTE — Progress Notes (Signed)
Daily Session Note  Patient Details  Name: Daniel Oconnell MRN: [id number] Date of Birth: March 30, 1956 Referring Provider:   [name] Pulmonary Rehab Walk Test from 12/22/2021 in [place]  Referring Provider [name]       Encounter Date: 02/17/2022  Check In:  Session Check In - 02/17/22 1430       Check-In   Supervising physician immediately available to respond to emergencies [facility] - Physician supervision    Physician(s) Shearon Stalls    [facility]    Staff Present Elmon Else, MS, ACSM-CEP, Exercise Physiologist;Carlette Wilber Oliphant, RN, BSN;Glendel Jaggers Olen Cordial BS, ACSM-CEP, Exercise Physiologist;Lisa Ysidro Evert, RN    Virtual Visit No    Medication changes reported     No    Fall or balance concerns reported    No    Tobacco Cessation No Change    Warm-up and Cool-down Performed as group-led instruction    Resistance Training Performed Yes    VAD Patient? No    PAD/SET Patient? No      Pain Assessment   Currently in Pain? No/denies    Multiple Pain Sites No             Capillary Blood Glucose: No results found for this or any previous visit (from the past 24 hour(s)).   Exercise Prescription Changes - 02/17/22 1500       Response to Exercise   Blood Pressure (Admit) 110/62    Blood Pressure (Exercise) 128/70    Blood Pressure (Exit) 128/60    Heart Rate (Admit) 84 bpm    Heart Rate (Exercise) 101 bpm    Heart Rate (Exit) 92 bpm    Oxygen Saturation (Admit) 96 %    Oxygen Saturation (Exercise) 95 %    Oxygen Saturation (Exit) 92 %    Rating of Perceived Exertion (Exercise) 13    Perceived Dyspnea (Exercise) 2    Duration Continue with 30 min of aerobic exercise without signs/symptoms of physical distress.    Intensity THRR unchanged      Progression   Progression Continue to progress workloads to maintain intensity without signs/symptoms of physical distress.      Resistance Training   Training Prescription Yes     Weight yellow bands    Reps 10-15    Time 10 Minutes      Interval Training   Interval Training No      Oxygen   Oxygen Continuous    Liters 4      Recumbant Bike   Level 4    Minutes 15    METs 2      NuStep   Level 4    SPM 80    Minutes 15    METs 1.9      Oxygen   Maintain Oxygen Saturation 88% or higher             Social History   Tobacco Use  Smoking Status Former   Packs/day: 1.00   Years: 28.00   Total pack years: 28.00   Types: Cigarettes   Quit date: 2013   Years since quitting: 10.7  Smokeless Tobacco Never    Goals Met:  Using PLB without cueing & demonstrates good technique Exercise tolerated well No report of concerns or symptoms today Strength training completed today  Goals Unmet:  Not Applicable  Comments: Service time is from 1311 to 1432.    Dr. Rodman Pickle is Medical Director for Pulmonary Rehab at [facility].

## 2022-02-19 ENCOUNTER — Encounter (HOSPITAL_COMMUNITY)
Admission: RE | Admit: 2022-02-19 | Discharge: 2022-02-19 | Disposition: A | Discharge status: Home / Self Care | Payer: Medicare HMO | Source: Ambulatory Visit | Attending: Pulmonary Disease | Admitting: Pulmonary Disease

## 2022-02-19 DIAGNOSIS — J9611 Chronic respiratory failure with hypoxia: Secondary | ICD-10-CM

## 2022-02-19 DIAGNOSIS — J9610 Chronic respiratory failure, unspecified whether with hypoxia or hypercapnia: Secondary | ICD-10-CM | POA: Diagnosis not present

## 2022-02-19 NOTE — Progress Notes (Signed)
Daily Session Note  Patient Details  Name: Daniel Oconnell MRN: [id number] Date of Birth: Feb 20, 1956 Referring Provider:   [name] Pulmonary Rehab Walk Test from 12/22/2021 in [place]  Referring Provider [name]       Encounter Date: 02/19/2022  Check In:  Session Check In - 02/19/22 1430       Check-In   Supervising physician immediately available to respond to emergencies [facility] - Physician supervision    Physician(s) Shearon Stalls    [facility]    Staff Present Elmon Else, MS, ACSM-CEP, Exercise Physiologist;Randi Yevonne Pax, ACSM-CEP, Exercise Physiologist;Chasiti Waddington Ysidro Evert, RN;David [place], MS, ACSM-CEP, CCRP, Exercise Physiologist    Virtual Visit No    Medication changes reported     No    Fall or balance concerns reported    No    Tobacco Cessation No Change    Warm-up and Cool-down Performed as group-led instruction    Resistance Training Performed Yes    VAD Patient? No    PAD/SET Patient? No      Pain Assessment   Currently in Pain? No/denies    Multiple Pain Sites No             Capillary Blood Glucose: No results found for this or any previous visit (from the past 24 hour(s)).    Social History   Tobacco Use  Smoking Status Former   Packs/day: 1.00   Years: 28.00   Total pack years: 28.00   Types: Cigarettes   Quit date: 2013   Years since quitting: 10.7  Smokeless Tobacco Never    Goals Met:  [place] associated with RPD/PD & O2 Sat Exercise tolerated well No report of concerns or symptoms today Strength training completed today  Goals Unmet:  Not Applicable  Comments: Service time is from [place] to 66    Dr. Rodman Pickle is Medical Director for Pulmonary Rehab at [facility].

## 2022-02-24 ENCOUNTER — Encounter (HOSPITAL_COMMUNITY)
Admission: RE | Admit: 2022-02-24 | Discharge: 2022-02-24 | Disposition: A | Discharge status: Home / Self Care | Payer: Medicare HMO | Source: Ambulatory Visit | Attending: Pulmonary Disease | Admitting: Pulmonary Disease

## 2022-02-24 DIAGNOSIS — J9611 Chronic respiratory failure with hypoxia: Secondary | ICD-10-CM

## 2022-02-24 DIAGNOSIS — J9610 Chronic respiratory failure, unspecified whether with hypoxia or hypercapnia: Secondary | ICD-10-CM | POA: Diagnosis not present

## 2022-02-24 NOTE — Progress Notes (Signed)
Daily Session Note  Patient Details  Name: Daniel Oconnell MRN: [id number] Date of Birth: 03/24/1956 Referring Provider:   [name] Pulmonary Rehab Walk Test from 12/22/2021 in [place]  Referring Provider [name]       Encounter Date: 02/24/2022  Check In:  Session Check In - 02/24/22 1428       Check-In   Supervising physician immediately available to respond to emergencies [facility] - Physician supervision    Physician(s) Dr. Erskine Emery    [facility]    Staff Present Lesly Rubenstein, MS, ACSM-CEP, CCRP, Exercise Physiologist;Randi Yevonne Pax, ACSM-CEP, Exercise Physiologist;Pricsilla Lindvall Clarisa Schools, MS, ACSM-CEP, Exercise Physiologist    Virtual Visit No    Medication changes reported     No    Fall or balance concerns reported    No    Tobacco Cessation No Change    Warm-up and Cool-down Performed as group-led instruction    Resistance Training Performed Yes    VAD Patient? No    PAD/SET Patient? No      Pain Assessment   Currently in Pain? No/denies    Multiple Pain Sites No             Capillary Blood Glucose: No results found for this or any previous visit (from the past 24 hour(s)).    Social History   Tobacco Use  Smoking Status Former   Packs/day: 1.00   Years: 28.00   Total pack years: 28.00   Types: Cigarettes   Quit date: 2013   Years since quitting: 10.7  Smokeless Tobacco Never    Goals Met:  [place] associated with RPD/PD & O2 Sat Exercise tolerated well No report of concerns or symptoms today Strength training completed today  Goals Unmet:  Not Applicable  Comments: Service time is from [place] to [place]    Dr. Rodman Pickle is Medical Director for Pulmonary Rehab at [facility].

## 2022-02-26 ENCOUNTER — Encounter (HOSPITAL_COMMUNITY)
Admission: RE | Admit: 2022-02-26 | Discharge: 2022-02-26 | Disposition: A | Discharge status: Home / Self Care | Payer: Medicare HMO | Source: Ambulatory Visit | Attending: Pulmonary Disease | Admitting: Pulmonary Disease

## 2022-02-26 DIAGNOSIS — J9611 Chronic respiratory failure with hypoxia: Secondary | ICD-10-CM

## 2022-02-26 DIAGNOSIS — J9610 Chronic respiratory failure, unspecified whether with hypoxia or hypercapnia: Secondary | ICD-10-CM | POA: Diagnosis not present

## 2022-02-26 NOTE — Progress Notes (Signed)
Daily Session Note  Patient Details  Name: Daniel Oconnell MRN: [id number] Date of Birth: 10/09/1955 Referring Provider:   [name] Pulmonary Rehab Walk Test from 12/22/2021 in [place]  Referring Provider [name]       Encounter Date: 02/26/2022  Check In:  Session Check In - 02/26/22 1504       Check-In   Supervising physician immediately available to respond to emergencies [facility] - Physician supervision    Physician(s) Erskine Emery    [facility]    Staff Present Josephina Shih BS, ACSM-CEP, Exercise Physiologist;Lisa Ysidro Evert, RN    Virtual Visit No    Medication changes reported     No    Fall or balance concerns reported    No    Tobacco Cessation No Change    Warm-up and Cool-down Performed as group-led instruction    Resistance Training Performed Yes    VAD Patient? No    PAD/SET Patient? No      Pain Assessment   Currently in Pain? No/denies    Multiple Pain Sites No             Capillary Blood Glucose: No results found for this or any previous visit (from the past 24 hour(s)).    Social History   Tobacco Use  Smoking Status Former   Packs/day: 1.00   Years: 28.00   Total pack years: 28.00   Types: Cigarettes   Quit date: 2013   Years since quitting: 10.7  Smokeless Tobacco Never    Goals Met:  [place] associated with RPD/PD & O2 Sat Improved SOB with ADL's Using PLB without cueing & demonstrates good technique Exercise tolerated well Personal goals reviewed Queuing for purse lip breathing No report of concerns or symptoms today  Goals Unmet:  Not Applicable  Comments: Pt completed 6 min walk test today and graduated. Pt made significant gains in program. Service time is from 1450 to 1520.    Dr. Rodman Pickle is Medical Director for Pulmonary Rehab at [facility].

## 2022-03-09 ENCOUNTER — Telehealth: Payer: Self-pay | Admitting: Pulmonary Disease

## 2022-03-09 NOTE — Telephone Encounter (Signed)
Called and left voicemail for that side of the nursing home to call office back in regards to why they are wanting to stop the blood pressures.   Please get extension number of phone number we need to call back.   Thank you

## 2022-03-10 NOTE — Progress Notes (Signed)
Discharge Progress Report  Patient Details  Name: Daniel Oconnell MRN: [id number] Date of Birth: 03-22-1956 Referring Provider:   [name] Walk Test from 12/22/2021 in [place]  Referring Provider [place]        Number of Visits: 17  Reason for Discharge:  Patient has met program and personal goals.  Smoking History:  Social History   Tobacco Use  Smoking Status Former   Packs/day: 1.00   Years: 28.00   Total pack years: 28.00   Types: Cigarettes   Quit date: 2013   Years since quitting: 10.8  Smokeless Tobacco Never    Diagnosis:  Chronic respiratory failure with hypoxia ([place])  ADL [facility]:  Pulmonary Assessment Scores     Row Name 12/22/21 1109 02/17/22 1537 02/26/22 1604     ADL [facility]   ADL Phase Entry Exit Exit   SOB Score total 106 88 --     CAT Score   CAT Score 30 23 --     mMRC Score   mMRC Score 4 -- 3            Initial Exercise Prescription:  Initial Exercise Prescription - 12/22/21 1200       Date of Initial Exercise RX and Referring Provider   Date 12/22/21    Referring Provider [name]    Expected Discharge Date 02/26/22      Oxygen   Oxygen Continuous    Liters 3    Maintain Oxygen Saturation 88% or higher      Recumbant Bike   Level 1    RPM 20    Minutes 25      Prescription Details   Frequency (times per week) 2    Duration Progress to 30 minutes of continuous aerobic without signs/symptoms of physical distress      Intensity   THRR 40-80% of Max Heartrate 61-122    Ratings of Perceived Exertion 11-13    Perceived Dyspnea 0-4      Progression   Progression Continue to progress workloads to maintain intensity without signs/symptoms of physical distress.      Resistance Training   Training Prescription Yes    Weight yellow bands    Reps 10-15             Discharge Exercise Prescription (Final Exercise Prescription Changes):  Exercise Prescription Changes -  02/17/22 1500       Response to Exercise   Blood Pressure (Admit) 110/62    Blood Pressure (Exercise) 128/70    Blood Pressure (Exit) 128/60    Heart Rate (Admit) 84 bpm    Heart Rate (Exercise) 101 bpm    Heart Rate (Exit) 92 bpm    Oxygen Saturation (Admit) 96 %    Oxygen Saturation (Exercise) 95 %    Oxygen Saturation (Exit) 92 %    Rating of Perceived Exertion (Exercise) 13    Perceived Dyspnea (Exercise) 2    Duration Continue with 30 min of aerobic exercise without signs/symptoms of physical distress.    Intensity THRR unchanged      Progression   Progression Continue to progress workloads to maintain intensity without signs/symptoms of physical distress.      Resistance Training   Training Prescription Yes    Weight yellow bands    Reps 10-15    Time 10 Minutes      Interval Training   Interval Training No      Oxygen   Oxygen Continuous  Liters 4      Recumbant Bike   Level 4    Minutes 15    METs 2      NuStep   Level 4    SPM 80    Minutes 15    METs 1.9      Oxygen   Maintain Oxygen Saturation 88% or higher             Functional Capacity:  6 Minute Walk     Row Name 12/22/21 1238 02/26/22 1551       6 Minute Walk   Phase Initial Discharge    Distance 180 feet 535 feet    Distance % Change -- 197.2 %    Distance Feet Change -- 355 ft    Walk Time 6 minutes 6 minutes    # of Rest Breaks 3  0:34-1:132, 2:39-5:00, 5:30-6:00 0    MPH 0.34 1.01    METS 1.18 1.8    RPE 19 13    Perceived Dyspnea  4 2    VO2 Peak 4.12 6.3    Symptoms No Yes (comment)  DOE    Comments -- practiced pursed lip breathing    Resting HR 69 bpm 69 bpm    Resting BP 122/66 140/80    Resting Oxygen Saturation  90 % 93 %    Exercise Oxygen Saturation  during 6 min walk 91 % 90 %    Max Ex. HR 85 bpm 123 bpm    Max Ex. BP 156/78 188/70    2 Minute Post BP 130/76 172/70      Interval HR   1 Minute HR 76 83    2 Minute HR 78 88    3 Minute HR 73 106    4  Minute HR 62 112    5 Minute HR 73 120    6 Minute HR 85 123    2 Minute Post HR 62 95    Interval Heart Rate? Yes Yes      Interval Oxygen   Interval Oxygen? Yes Yes    Baseline Oxygen Saturation % 90 % 93 %    1 Minute Oxygen Saturation % 94 % 91 %    1 Minute Liters of Oxygen 3 L 4 L    2 Minute Oxygen Saturation % 92 % 87 %    2 Minute Liters of Oxygen 3 L 4 L    3 Minute Oxygen Saturation % 93 % 91 %    3 Minute Liters of Oxygen 3 L 4 L    4 Minute Oxygen Saturation % 95 % 91 %    4 Minute Liters of Oxygen 3 L 4 L    5 Minute Oxygen Saturation % 97 % 90 %    5 Minute Liters of Oxygen 3 L 4 L    6 Minute Oxygen Saturation % 91 % 90 %    6 Minute Liters of Oxygen 3 L 4 L    2 Minute Post Oxygen Saturation % 96 % 92 %    2 Minute Post Liters of Oxygen 3 L 4 L             Psychological, QOL, Others - Outcomes: PHQ 2/9:    12/22/2021   11:16 AM  Depression screen PHQ 2/9  Decreased Interest 0  Down, Depressed, Hopeless 0  PHQ - 2 Score 0  Altered sleeping 0  Tired, decreased energy 0  Change in appetite 0  Feeling bad or failure about yourself  0  Trouble concentrating 0  Moving slowly or fidgety/restless 0  Suicidal thoughts 0  PHQ-9 Score 0    Quality of Life:   Personal Goals: Goals established at orientation with interventions provided to work toward goal.  Personal Goals and Risk Factors at Admission - 12/22/21 1121       Core Components/Risk Factors/Patient Goals on Admission    Weight Management Weight Loss    Improve shortness of breath with ADL's Yes    Intervention Provide education, individualized exercise plan and daily activity instruction to help decrease symptoms of SOB with activities of daily living.    Expected Outcomes Short Term: Improve cardiorespiratory fitness to achieve a reduction of symptoms when performing ADLs;Long Term: Be able to perform more ADLs without symptoms or delay the onset of symptoms              Personal  Goals Discharge:  Goals and Risk Factor Review     Row Name 12/24/21 0916 01/13/22 1552 01/13/22 1557 02/11/22 1048       Core Components/Risk Factors/Patient Goals Review   Personal Goals Review Other Improve shortness of breath with ADL's;Weight Management/Obesity -- Weight Management/Obesity;Improve shortness of breath with ADL's;Develop more efficient breathing techniques such as purse lipped breathing and diaphragmatic breathing and practicing self-pacing with activity.    Review Weight loss has been a concern for him. He is scheduled to start PR 12/30/21. Will continue to monitor core components. Daniel Oconnell has attended 5 exercise sessions, and has lost no weight, but he is gaining strength and stamina.  He is challenged by 2 prior strokes and he is unable to use his right arm and hand and walking is slow.   He is very determined and works very hard despite his severe shortness of breath with activity.  He is exercising @ 1.6 mets on the recumbent bike and nustep. -- Daniel Oconnell's weight has been the same throughout his time in the program. He has had no weight loss. He is exercising on the nustep and recumbent bike. 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 Daniel Oconnell reports mild to moderate SOB with exercise.    Expected Outcomes See admission goals. -- See admission goals See admission goals             Exercise Goals and Review:  Exercise Goals     Row Name 12/22/21 1127 12/23/21 0911 01/14/22 1150 02/09/22 1010       Exercise Goals   Increase Physical Activity Yes Yes Yes Yes    Intervention Provide advice, education, support and counseling about physical activity/exercise needs.;Develop an individualized exercise prescription for aerobic and resistive training  based on initial evaluation findings, risk stratification, comorbidities and participant's personal goals. Provide advice, education, support and counseling about physical activity/exercise needs.;Develop an individualized exercise prescription for aerobic and resistive training based on initial evaluation findings, risk stratification, comorbidities and participant's personal goals. Provide advice, education, support and counseling about physical activity/exercise needs.;Develop an individualized exercise prescription for aerobic and resistive training based on initial evaluation findings, risk stratification, comorbidities and participant's personal goals. Provide advice, education, support and counseling about physical activity/exercise needs.;Develop an individualized exercise prescription for aerobic and resistive training based on initial evaluation findings, risk stratification, comorbidities  and participant's personal goals.    Expected Outcomes Short Term: Attend rehab on a regular basis to increase amount of physical activity.;Long Term: Add in home exercise to make exercise part of routine and to increase amount of physical activity.;Long Term: Exercising regularly at least 3-5 days a week. Short Term: Attend rehab on a regular basis to increase amount of physical activity.;Long Term: Add in home exercise to make exercise part of routine and to increase amount of physical activity.;Long Term: Exercising regularly at least 3-5 days a week. Short Term: Attend rehab on a regular basis to increase amount of physical activity.;Long Term: Add in home exercise to make exercise part of routine and to increase amount of physical activity.;Long Term: Exercising regularly at least 3-5 days a week. Short Term: Attend rehab on a regular basis to increase amount of physical activity.;Long Term: Add in home exercise to make exercise part of routine and to increase amount of physical activity.;Long Term: Exercising  regularly at least 3-5 days a week.    Increase Strength and Stamina Yes Yes Yes Yes    Intervention Provide advice, education, support and counseling about physical activity/exercise needs.;Develop an individualized exercise prescription for aerobic and resistive training based on initial evaluation findings, risk stratification, comorbidities and participant's personal goals. Provide advice, education, support and counseling about physical activity/exercise needs.;Develop an individualized exercise prescription for aerobic and resistive training based on initial evaluation findings, risk stratification, comorbidities and participant's personal goals. Provide advice, education, support and counseling about physical activity/exercise needs.;Develop an individualized exercise prescription for aerobic and resistive training based on initial evaluation findings, risk stratification, comorbidities and participant's personal goals. Provide advice, education, support and counseling about physical activity/exercise needs.;Develop an individualized exercise prescription for aerobic and resistive training based on initial evaluation findings, risk stratification, comorbidities and participant's personal goals.    Expected Outcomes Short Term: Increase workloads from initial exercise prescription for resistance, speed, and METs.;Short Term: Perform resistance training exercises routinely during rehab and add in resistance training at home;Long Term: Improve cardiorespiratory fitness, muscular endurance and strength as measured by increased METs and functional capacity (6MWT) Short Term: Increase workloads from initial exercise prescription for resistance, speed, and METs.;Short Term: Perform resistance training exercises routinely during rehab and add in resistance training at home;Long Term: Improve cardiorespiratory fitness, muscular endurance and strength as measured by increased METs and functional capacity (6MWT)  Short Term: Increase workloads from initial exercise prescription for resistance, speed, and METs.;Short Term: Perform resistance training exercises routinely during rehab and add in resistance training at home;Long Term: Improve cardiorespiratory fitness, muscular endurance and strength as measured by increased METs and functional capacity (6MWT) Short Term: Increase workloads from initial exercise prescription for resistance, speed, and METs.;Short Term: Perform resistance training exercises routinely during rehab and add in resistance training at home;Long Term: Improve cardiorespiratory fitness, muscular endurance and strength as measured by increased METs and functional capacity (6MWT)    Able to understand and use rate of perceived exertion (RPE) scale Yes Yes Yes Yes    Intervention Provide education and explanation on how to use RPE scale Provide education and explanation on how to use RPE scale Provide education and explanation on how to use RPE scale Provide education and explanation on how to use RPE scale    Expected Outcomes Short Term: Able to use RPE daily in rehab to express subjective intensity level;Long Term:  Able to use RPE to guide intensity level when exercising independently Short Term: Able to use RPE daily  in rehab to express subjective intensity level;Long Term:  Able to use RPE to guide intensity level when exercising independently Short Term: Able to use RPE daily in rehab to express subjective intensity level;Long Term:  Able to use RPE to guide intensity level when exercising independently Short Term: Able to use RPE daily in rehab to express subjective intensity level;Long Term:  Able to use RPE to guide intensity level when exercising independently    Able to understand and use Dyspnea scale Yes Yes Yes Yes    Intervention Provide education and explanation on how to use Dyspnea scale Provide education and explanation on how to use Dyspnea scale Provide education and explanation  on how to use Dyspnea scale Provide education and explanation on how to use Dyspnea scale    Expected Outcomes Short Term: Able to use Dyspnea scale daily in rehab to express subjective sense of shortness of breath during exertion;Long Term: Able to use Dyspnea scale to guide intensity level when exercising independently Short Term: Able to use Dyspnea scale daily in rehab to express subjective sense of shortness of breath during exertion;Long Term: Able to use Dyspnea scale to guide intensity level when exercising independently Short Term: Able to use Dyspnea scale daily in rehab to express subjective sense of shortness of breath during exertion;Long Term: Able to use Dyspnea scale to guide intensity level when exercising independently Short Term: Able to use Dyspnea scale daily in rehab to express subjective sense of shortness of breath during exertion;Long Term: Able to use Dyspnea scale to guide intensity level when exercising independently    Knowledge and understanding of Target Heart Rate Range (THRR) Yes Yes Yes Yes    Intervention Provide education and explanation of THRR including how the numbers were predicted and where they are located for reference Provide education and explanation of THRR including how the numbers were predicted and where they are located for reference Provide education and explanation of THRR including how the numbers were predicted and where they are located for reference Provide education and explanation of THRR including how the numbers were predicted and where they are located for reference    Expected Outcomes Short Term: Able to state/look up THRR;Long Term: Able to use THRR to govern intensity when exercising independently;Short Term: Able to use daily as guideline for intensity in rehab Short Term: Able to state/look up THRR;Long Term: Able to use THRR to govern intensity when exercising independently;Short Term: Able to use daily as guideline for intensity in rehab Short  Term: Able to state/look up THRR;Long Term: Able to use THRR to govern intensity when exercising independently;Short Term: Able to use daily as guideline for intensity in rehab Short Term: Able to state/look up THRR;Long Term: Able to use THRR to govern intensity when exercising independently;Short Term: Able to use daily as guideline for intensity in rehab    Understanding of Exercise Prescription Yes Yes Yes Yes    Intervention Provide education, explanation, and written materials on patient's individual exercise prescription Provide education, explanation, and written materials on patient's individual exercise prescription Provide education, explanation, and written materials on patient's individual exercise prescription Provide education, explanation, and written materials on patient's individual exercise prescription    Expected Outcomes Short Term: Able to explain program exercise prescription;Long Term: Able to explain home exercise prescription to exercise independently Short Term: Able to explain program exercise prescription;Long Term: Able to explain home exercise prescription to exercise independently Short Term: Able to explain program exercise prescription;Long Term: Able to explain  home exercise prescription to exercise independently Short Term: Able to explain program exercise prescription;Long Term: Able to explain home exercise prescription to exercise independently             Exercise Goals Re-Evaluation:  Exercise Goals Re-Evaluation     Row Name 12/23/21 0911 01/14/22 1150 02/09/22 1010         Exercise Goal Re-Evaluation   Exercise Goals Review Increase Physical Activity;Increase Strength and Stamina;Able to understand and use rate of perceived exertion (RPE) scale;Able to understand and use Dyspnea scale;Knowledge and understanding of Target Heart Rate Range (THRR);Understanding of Exercise Prescription Increase Physical Activity;Increase Strength and Stamina;Able to  understand and use rate of perceived exertion (RPE) scale;Able to understand and use Dyspnea scale;Knowledge and understanding of Target Heart Rate Range (THRR);Understanding of Exercise Prescription Increase Physical Activity;Increase Strength and Stamina;Able to understand and use rate of perceived exertion (RPE) scale;Able to understand and use Dyspnea scale;Knowledge and understanding of Target Heart Rate Range (THRR);Understanding of Exercise Prescription     Comments Daniel Oconnell is scheduled to begin exercise next week. Will continue to monitor and progress as able. Daniel Oconnell has completed 5 exercise sessions. He is exercising on the recumbent bike for 15 min, level 1.5 at 1.6 METs. He is then exercising on the nustep for 15 min at level 2 for 1.6 METs. His METs have not progressed yet. He is performing modified warm up and cool down with one on one verbal and demonstrative cues tolerating well. Will discuss home exercise soon. Will continue to monitor and progress as tolerated. Daniel Oconnell has completed 11 exercise sessions. He is exercising on the recumbent bike for 15 min, level 4 at 1.9 METs. He is then exercising on the nustep for 15 min at level 4 for 1.8 METs. Although he has increased his workload, METs have not progressed substantially. He is performing modified warm up and cool down with one on one verbal and demonstrative cues tolerating well. Home exercise has been discussed. Daniel Oconnell is motivated in class but unsure if he will be motivated at home. Will continue to monitor and progress as tolerated.     Expected Outcomes Through exercise at rehab and home, the patient will decrease shortness of breath with daily activities and feel confident in carrying out an exercise regimen at home. Through exercise at rehab and home, the patient will decrease shortness of breath with daily activities and feel confident in carrying out an exercise regimen at home. Through exercise at rehab and home, the patient will decrease  shortness of breath with daily activities and feel confident in carrying out an exercise regimen at home.              Nutrition & Weight - Outcomes:   Post Biometrics - 02/26/22 1603        Post  Biometrics   Grip Strength 32 kg             Nutrition:  Nutrition Therapy & Goals - 02/24/22 1509       Nutrition Therapy   Diet Heart healthy/general healthy diet   patient lives in assisted living and is prescribed a general diet there   Drug/Food Interactions Statins/Certain Fruits      Personal Nutrition Goals   Nutrition Goal Patient to choose a daily variety of fruits, vegetables, whole grains, lean protein/plant protein, nonfat/lowfat dairy as part of heart healthy lifestyle    Personal Goal #2 Patient to limit to <2373m-1500mg of sodium daily    Comments Goals in  progress. Patient diagnosed with expressive aphasia. His ex-wife and support, Daniel Oconnell, has been present throughout pulmonary rehab. She reports that Daniel Oconnell lives at [facility]; he eats three meals per day and 2 snacks and is prescribed a general diet there. They do eat out ~3x/week together and he enjoys sweets daily. Weight is stable with appropriate appetite. No new labs at this time but most recent labs show Lipids WNL, HDL 38      Intervention Plan   Intervention Prescribe, educate and counsel regarding individualized specific dietary modifications aiming towards targeted core components such as weight, hypertension, lipid management, diabetes, heart failure and other comorbidities.    Expected Outcomes Short Term Goal: Understand basic principles of dietary content, such as calories, fat, sodium, cholesterol and nutrients.;Long Term Goal: Adherence to prescribed nutrition plan.             Nutrition Discharge:  Nutrition Assessments - 02/19/22 0855       Rate Your Plate Scores   Post Score 62             Education Questionnaire Score:  Knowledge Questionnaire Score - 02/17/22 1538        Knowledge Questionnaire Score   Post Score 18/18             Goals reviewed with patient; copy given to patient.

## 2022-03-16 ENCOUNTER — Ambulatory Visit: Payer: Medicare HMO | Attending: Nurse Practitioner | Admitting: Nurse Practitioner

## 2022-03-16 ENCOUNTER — Other Ambulatory Visit: Payer: Self-pay

## 2022-03-16 ENCOUNTER — Encounter: Payer: Self-pay | Admitting: Nurse Practitioner

## 2022-03-16 VITALS — BP 126/52 | HR 72 | Ht 67.0 in | Wt 185.0 lb

## 2022-03-16 DIAGNOSIS — I10 Essential (primary) hypertension: Secondary | ICD-10-CM

## 2022-03-16 DIAGNOSIS — I5032 Chronic diastolic (congestive) heart failure: Secondary | ICD-10-CM

## 2022-03-16 DIAGNOSIS — I779 Disorder of arteries and arterioles, unspecified: Secondary | ICD-10-CM

## 2022-03-16 DIAGNOSIS — E785 Hyperlipidemia, unspecified: Secondary | ICD-10-CM | POA: Diagnosis not present

## 2022-03-16 DIAGNOSIS — J449 Chronic obstructive pulmonary disease, unspecified: Secondary | ICD-10-CM

## 2022-03-16 DIAGNOSIS — I2510 Atherosclerotic heart disease of native coronary artery without angina pectoris: Secondary | ICD-10-CM | POA: Diagnosis not present

## 2022-03-16 DIAGNOSIS — R0602 Shortness of breath: Secondary | ICD-10-CM | POA: Diagnosis not present

## 2022-03-16 DIAGNOSIS — Z8673 Personal history of transient ischemic attack (TIA), and cerebral infarction without residual deficits: Secondary | ICD-10-CM

## 2022-03-16 MED ORDER — TORSEMIDE 60 MG PO TABS: ORAL_TABLET | ORAL | 3 refills | Status: DC

## 2022-03-16 NOTE — Patient Instructions (Signed)
Medication Instructions:  Torsemide 60 mg daily (take 40 mg in the morning and 20 mg in the afternoon).  *If you need a refill on your cardiac medications before your next appointment, please call your pharmacy*   Lab Work: Your physician recommends that you return for lab work in 2 weeks BMET  If you have labs (blood work) drawn today and your tests are completely normal, you will receive your results only by: [place] (if you have MyChart) OR A paper copy in the mail If you have any lab test that is abnormal or we need to change your treatment, we will call you to review the results.   Testing/Procedures: NONE ordered at this time of appointment     Follow-Up: At [facility], you and your health needs are our priority.  As part of our continuing mission to provide you with exceptional heart care, we have created designated Provider Care Teams.  These Care Teams include your primary Cardiologist (physician) and Advanced Practice Providers (APPs -  Physician Assistants and Nurse Practitioners) who all work together to provide you with the care you need, when you need it.  We recommend signing up for the patient portal called "MyChart".  Sign up information is provided on this After Visit Summary.  MyChart is used to connect with patients for Virtual Visits (Telemedicine).  Patients are able to view lab/test results, encounter notes, upcoming appointments, etc.  Non-urgent messages can be sent to your provider as well.   To learn more about what you can do with MyChart, go to [email or web address].    Your next appointment:   3 month(s)  The format for your next appointment:   In Person  Provider:   Minus Breeding, MD  or Diona Browner, NP        Other Instructions   Important Information About Sugar

## 2022-03-16 NOTE — Progress Notes (Signed)
Office Visit    Patient Name: Daniel Oconnell Date of Encounter: 03/16/2022  Primary Care Provider:  Jeanette Caprice, PA-C Primary Cardiologist:  Minus Breeding, MD  Chief Complaint    66 year old male with a history of CAD, chronic diastolic heart failure, hypertension, hyperlipidemia, CVA x2, carotid artery stenosis s/p carotid stenting, and COPD who presents for follow-up related to CAD and heart failure.  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  He has a history of CAD s/p CABG in 2001 at [place].  Additionally, he has a history of CVA in 2001 as well as 2013 which left him with right-sided hemiparesis.  He subsequently underwent carotid artery stenting.  He lives at a nursing home.  He has a history of COPD. Echocardiogram in 07/2021 showed EF 60 to 65%, normal LV function, no RWMA, moderate concentric LVH, normal RV systolic function, mild mitral valve regurgitation. He was last seen in the office on 10/15/2021 and noted ongoing shortness of breath. Lasix was transitioned to torsemide.  Additionally, his  beta-blocker was discontinued.  Follow-up PYP scan in 10/2021 was normal, no suggestion of TTR amyloidosis.   He presents today for follow-up accompanied by his ex-wife.  Since his last visit has been stable from a cardiac standpoint.  He denies worsening dyspnea, PND, orthopnea, weight gain.  He has noticed over the past couple of weeks an increase in bilateral lower extremity edema.  Denies chest pain, palpitations.  He just completed pulmonary rehab.  Other than his worsening lower extremity edema, he reports feeling well denies any additional concerns today.  Home Medications    Current Outpatient Medications  Medication Sig Dispense Refill   acetaminophen (TYLENOL) 500 MG tablet Take 500 mg by mouth every 4 (four) hours as needed for mild pain, moderate pain, fever or headache.     albuterol (PROVENTIL) (2.5 MG/3ML) 0.083% nebulizer solution Take 3 mLs (2.5 mg total) by nebulization every 4 (four) hours as needed for wheezing or shortness of breath. 75 mL 0   albuterol (VENTOLIN HFA) 108 (90 Base) MCG/ACT inhaler Inhale 1-2 puffs into the lungs every 6 (six) hours as needed for wheezing or shortness of breath. 8 g 6   alum & mag hydroxide-simeth (MAALOX/MYLANTA) 200-200-20 MG/5ML suspension Take 30 mLs by mouth as needed for indigestion or heartburn.     ASPIRIN 81 PO Take 81 mg by mouth daily.     atorvastatin (LIPITOR) 80 MG tablet Take 80 mg by mouth at bedtime.  baclofen (LIORESAL) 20 MG tablet Take 20 mg by mouth 3 (three) times daily.     BREZTRI AEROSPHERE 160-9-4.8 MCG/ACT AERO Inhale into the lungs.     clopidogrel (PLAVIX) 75 MG tablet Take 75 mg by mouth daily with breakfast.      dicyclomine (BENTYL) 10 MG capsule Take 10 mg by mouth daily.     docusate sodium (COLACE) 100 MG capsule Take 100 mg by mouth 2 (two) times daily.     guaifenesin (ROBITUSSIN) 100 MG/5ML syrup Take 200 mg by mouth every 6 (six) hours as needed for cough.     hydrALAZINE (APRESOLINE) 100 MG tablet  Take 1 tablet (100 mg total) by mouth 3 (three) times daily. 270 tablet 3   levETIRAcetam (KEPPRA) 750 MG tablet Take 750 mg by mouth 2 (two) times daily.     levothyroxine (SYNTHROID, LEVOTHROID) 25 MCG tablet Take 1 tablet by mouth daily.     loperamide (IMODIUM) 2 MG capsule Take by mouth as needed for diarrhea or loose stools.     magnesium hydroxide (MILK OF MAGNESIA) 400 MG/5ML suspension Take 30 mLs by mouth at bedtime as needed for mild constipation.     melatonin 3 MG TABS tablet Take 3 mg by mouth at bedtime.     mometasone (NASONEX) 50 MCG/ACT nasal spray SMARTSIG:Both Nares     neomycin-bacitracin-polymyxin (NEOSPORIN) ointment Apply 1 application. topically as needed for wound care.     Polyethyl Glycol-Propyl Glycol (SYSTANE ULTRA) 0.4-0.3 % SOLN Apply 1 drop to eye 3 (three) times daily.     polyethylene glycol powder (GLYCOLAX/MIRALAX) powder Take 17 g by mouth 2 (two) times daily. Use for constipation, as prescribed, until daily soft stools  OTC 119 g 0   potassium chloride (KLOR-CON M) 10 MEQ tablet Take 2 tablets (20 mEq total) by mouth daily. 180 tablet 3   sertraline (ZOLOFT) 100 MG tablet Take 100 mg by mouth daily with breakfast.      tamsulosin (FLOMAX) 0.4 MG CAPS capsule Take 1 capsule (0.4 mg total) by mouth daily. 30 capsule 0   traZODone (DESYREL) 100 MG tablet Take 100 mg by mouth at bedtime.     vitamin B-12 (CYANOCOBALAMIN) 1000 MCG tablet Take 1,000 mcg by mouth daily.     torsemide 60 MG TABS Take 40 mg in the morning and 20 mg in the afternoon. 84 tablet 3   No current facility-administered medications for this visit.     Review of Systems    He denies chest pain, palpitations, dyspnea, pnd, orthopnea, n, v, dizziness, syncope, edema, weight gain, or early satiety. All other systems reviewed and are otherwise negative except as noted above.  Physical Exam    VS:  BP (!) 126/52 (BP Location: Left Arm, Patient Position: Sitting, Cuff Size: Normal)   Pulse  72   Ht 5\' 7"  (1.702 m)   Wt 185 lb (83.9 kg)   BMI 28.98 kg/m  GEN: Well nourished, well developed, in no acute distress. HEENT: normal. Neck: Supple, no JVD, carotid bruits, or masses. Cardiac: RRR, no murmurs, rubs, or gallops. No clubbing, cyanosis, edema.  Radials/DP/PT 2+ and equal bilaterally.  Respiratory:  Respirations regular and unlabored, clear to auscultation bilaterally. GI: Soft, nontender, nondistended, BS + x 4. MS: no deformity or atrophy. Skin: warm and dry, no rash. Neuro:  Strength and sensation are intact. Psych: Normal affect.  Accessory Clinical Findings    ECG personally reviewed by me today -sinus rhythm, 72 bpm, PVCs, RBBB-  no acute changes.   Lab Results  Component Value Date   WBC 10.8 (H) 01/30/2022   HGB 14.3 01/30/2022   HCT 41.8 01/30/2022   MCV 83.9 01/30/2022   PLT 220.0 01/30/2022   Lab Results  Component Value Date   CREATININE 1.03 11/19/2021   BUN 13 11/19/2021   NA 140 11/19/2021   K 3.9 11/19/2021   CL 96 11/19/2021   CO2 29 11/19/2021   Lab Results  Component Value Date   ALT 19 07/24/2021   AST 15 07/24/2021   ALKPHOS 99 07/24/2021   BILITOT 0.5 07/24/2021   Lab Results  Component Value Date   CHOL 112 07/24/2021   HDL 38 (L) 07/24/2021   LDLCALC 59 07/24/2021   TRIG 73 07/24/2021   CHOLHDL 2.9 07/24/2021    No results found for: "HGBA1C"  Assessment & Plan    1. CAD: S/p CABG in 2001.  He has stable chronic dyspnea, primarily in the setting of COPD.  Stable with no anginal symptoms. No indication for ischemic evaluation.  Continue Plavix, Lipitor.  2. Shortness of breath/chronic diastolic heart failure:  Echo in 07/2021 showed EF 60 to 65%, normal LV function, no RWMA, moderate concentric LVH, normal RV systolic function, mild mitral valve regurgitation. PYP scan in 10/2021 was normal, no suggestion of TTR amyloidosis.  He has noticed a recent increase in bilateral lower extremity edema over the past 2 weeks.  Both  torsemide and furosemide are still in his medication list.  Will make sure he is no longerr taking furosemide.  Will increase torsemide to 60 mg daily (40 mg in the a.m. and 20 mg in the afternoon around 12 PM).  We will check BMET in 2 weeks.  Continue to monitor symptoms.  3. Hypertension: BP well controlled. Continue current antihypertensive regimen.   4. Hyperlipidemia: LDL was 89 in 07/2021.  Continue Lipitor.  5. History of CVA: Residual right-sided hemiparesis.  Continue Plavix, Lipitor.  6. Carotid artery disease: S/p carotid artery stenting.  Continue Plavix, door.  7. COPD: Follows with pulmonology.  On 3 to 4 L home O2.  He just completed pulmonary rehab.  Denies worsening dyspnea.  8. Disposition: Follow-up in 3 months.      Lenna Sciara, NP 03/16/2022, 2:06 PM

## 2022-03-18 ENCOUNTER — Telehealth: Payer: Self-pay | Admitting: Cardiology

## 2022-03-18 MED ORDER — TORSEMIDE 60 MG PO TABS: ORAL_TABLET | ORAL | 3 refills | Status: AC

## 2022-03-18 NOTE — Telephone Encounter (Signed)
Per patient's chart, Roselyn Reef also reached to patient's cardiologist. Will close this encounter.

## 2022-03-18 NOTE — Telephone Encounter (Signed)
Pt's spouse would like a callback from nurse to discuss pt's medications. Please advise

## 2022-03-18 NOTE — Telephone Encounter (Signed)
Spouse asked for orders regarding furosemide and torsemide be faxed to [place] [facility] [place]. Verified with [facility] their fax number [phone]. Fax for torsemide orders, stopping furosemide, and BMET in 2 weeks faxed over.

## 2022-03-20 ENCOUNTER — Telehealth: Payer: Self-pay | Admitting: Pulmonary Disease

## 2022-03-20 DIAGNOSIS — J439 Emphysema, unspecified: Secondary | ICD-10-CM

## 2022-03-20 NOTE — Telephone Encounter (Signed)
Please advise,  Spk to Manuela Schwartz (wife) states [facility] is having their state audit and can't have a range for his oxygen. Order for POC is 2-4L pulse and continuous 3L  Wife also states pt is using his rescue inhaler daily and not as prn and would like rx sent as daily use. She's also grateful for pulm rehab referral as pt is not able to go to [facility]  Updated orders faxed Attn: Roselyn Reef, [place] [phone]

## 2022-03-25 NOTE — Telephone Encounter (Signed)
Updated oxygen orders: POC 4L pulse Continuous 3L  Ok to send in order for albuterol to be scheduled 2 puffs every 6 hours.  Thanks, JD

## 2022-03-26 MED ORDER — ALBUTEROL SULFATE HFA 108 (90 BASE) MCG/ACT IN AERS: 1.0000 | INHALATION_SPRAY | Freq: Four times a day (QID) | RESPIRATORY_TRACT | 6 refills | Status: AC | PRN

## 2022-03-26 NOTE — Telephone Encounter (Signed)
Printed off order for oxygen and made sure it was up to date. Printed off albuterol inhaler and faxed to [facility] as ordered. Nothing further needed

## 2022-04-03 LAB — BASIC METABOLIC PANEL
BUN/Creatinine Ratio: 14 (ref 10–24)
BUN: 15 mg/dL (ref 8–27)
CO2: 31 mmol/L — ABNORMAL HIGH (ref 20–29)
Calcium: 9.2 mg/dL (ref 8.6–10.2)
Chloride: 94 mmol/L — ABNORMAL LOW (ref 96–106)
Creatinine, Ser: 1.08 mg/dL (ref 0.76–1.27)
Glucose: 63 mg/dL — ABNORMAL LOW (ref 70–99)
Potassium: 3.2 mmol/L — ABNORMAL LOW (ref 3.5–5.2)
Sodium: 142 mmol/L (ref 134–144)
eGFR: 75 mL/min/{1.73_m2} (ref >59)

## 2022-04-06 ENCOUNTER — Other Ambulatory Visit: Payer: Self-pay

## 2022-04-06 ENCOUNTER — Telehealth: Payer: Self-pay

## 2022-04-06 ENCOUNTER — Telehealth: Payer: Self-pay | Admitting: Cardiology

## 2022-04-06 DIAGNOSIS — E876 Hypokalemia: Secondary | ICD-10-CM

## 2022-04-06 MED ORDER — POTASSIUM CHLORIDE CRYS ER 20 MEQ PO TBCR: 40.0000 meq | EXTENDED_RELEASE_TABLET | Freq: Every day | ORAL | 0 refills | Status: DC

## 2022-04-06 NOTE — Telephone Encounter (Signed)
Calling in bout the the patient potassium. Please advise

## 2022-04-06 NOTE — Telephone Encounter (Signed)
Called the [facility]. They are aware that the orders for potassium and lab work needed will be faxed [phone].

## 2022-04-06 NOTE — Telephone Encounter (Signed)
   Pt's daughter called back. She would like to request to faxed the lab result to the assistive facility as well

## 2022-04-06 NOTE — Telephone Encounter (Signed)
Spoke with pts daughter. She was notified of lab results and recommendations of Potassium 40 MEQ daily for 3 days and BMET in 2 weeks. Per pts daughter, she asked that lab order and new medication order be faxed to pts facility [phone] (attn to [place]).

## 2022-04-07 ENCOUNTER — Telehealth: Payer: Self-pay | Admitting: Cardiology

## 2022-04-07 NOTE — Telephone Encounter (Signed)
Orders and labs faxed to facility yesterday 04/06/22.

## 2022-04-07 NOTE — Telephone Encounter (Signed)
Spoke with Daniel Oconnell from [facility] regarding K+. Informed her that patient is to take K+ for 3 days and then go back to 20 mEq daily. She asked for a stop order of the daily for 3 days.

## 2022-04-07 NOTE — Telephone Encounter (Signed)
Daniel Oconnell states she is returning a call from [name], [employer] fund[employer]. Call disconnected

## 2022-04-07 NOTE — Telephone Encounter (Signed)
Order was faxed 04/06/22.

## 2022-04-07 NOTE — Telephone Encounter (Signed)
Pt c/o medication issue:  1. Name of Medication: potassium chloride SA (KLOR-CON M) 20 MEQ tablet   2. How are you currently taking this medication (dosage and times per day)?   3. Are you having a reaction (difficulty breathing--STAT)?   4. What is your medication issue? [facility] received orders for this medication, but would like a call back to get clarification on how to take this and if they should continue the other potassium he has prescribed to him, as well. Please advise.

## 2022-04-16 ENCOUNTER — Ambulatory Visit (HOSPITAL_COMMUNITY)
Admission: EM | Admit: 2022-04-16 | Discharge: 2022-04-16 | Disposition: A | Discharge status: Home / Self Care | Payer: Medicare HMO | Attending: Internal Medicine | Admitting: Internal Medicine

## 2022-04-16 ENCOUNTER — Encounter (HOSPITAL_COMMUNITY): Payer: Self-pay | Admitting: *Deleted

## 2022-04-16 ENCOUNTER — Ambulatory Visit (INDEPENDENT_AMBULATORY_CARE_PROVIDER_SITE_OTHER): Payer: Medicare HMO

## 2022-04-16 DIAGNOSIS — Z87891 Personal history of nicotine dependence: Secondary | ICD-10-CM | POA: Diagnosis not present

## 2022-04-16 DIAGNOSIS — J440 Chronic obstructive pulmonary disease with acute lower respiratory infection: Secondary | ICD-10-CM | POA: Insufficient documentation

## 2022-04-16 DIAGNOSIS — Z20822 Contact with and (suspected) exposure to covid-19: Secondary | ICD-10-CM | POA: Insufficient documentation

## 2022-04-16 DIAGNOSIS — I69320 Aphasia following cerebral infarction: Secondary | ICD-10-CM | POA: Diagnosis not present

## 2022-04-16 DIAGNOSIS — J441 Chronic obstructive pulmonary disease with (acute) exacerbation: Secondary | ICD-10-CM

## 2022-04-16 DIAGNOSIS — J3489 Other specified disorders of nose and nasal sinuses: Secondary | ICD-10-CM

## 2022-04-16 DIAGNOSIS — R059 Cough, unspecified: Secondary | ICD-10-CM | POA: Diagnosis present

## 2022-04-16 DIAGNOSIS — Z9981 Dependence on supplemental oxygen: Secondary | ICD-10-CM | POA: Diagnosis not present

## 2022-04-16 DIAGNOSIS — J069 Acute upper respiratory infection, unspecified: Secondary | ICD-10-CM

## 2022-04-16 LAB — RESP PANEL BY RT-PCR (RSV, FLU A&B, COVID)  RVPGX2
Influenza A by PCR: NEGATIVE
Influenza B by PCR: NEGATIVE
Resp Syncytial Virus by PCR: NEGATIVE
SARS Coronavirus 2 by RT PCR: NEGATIVE

## 2022-04-16 LAB — POC INFLUENZA A AND B ANTIGEN (URGENT CARE ONLY)
INFLUENZA A ANTIGEN, POC: NEGATIVE
INFLUENZA B ANTIGEN, POC: NEGATIVE

## 2022-04-16 MED ORDER — BENZONATATE 100 MG PO CAPS: 100.0000 mg | ORAL_CAPSULE | Freq: Three times a day (TID) | ORAL | 0 refills | Status: AC

## 2022-04-16 MED ORDER — ALBUTEROL SULFATE (2.5 MG/3ML) 0.083% IN NEBU
INHALATION_SOLUTION | RESPIRATORY_TRACT | Status: AC
  Filled 2022-04-16: qty 3

## 2022-04-16 MED ORDER — AZITHROMYCIN 250 MG PO TABS: 250.0000 mg | ORAL_TABLET | Freq: Every day | ORAL | 0 refills | Status: DC

## 2022-04-16 MED ORDER — ALBUTEROL SULFATE (2.5 MG/3ML) 0.083% IN NEBU
2.5000 mg | INHALATION_SOLUTION | Freq: Once | RESPIRATORY_TRACT | Status: AC
  Administered 2022-04-16: 2.5 mg via RESPIRATORY_TRACT

## 2022-04-16 MED ORDER — ACETAMINOPHEN 325 MG PO TABS
975.0000 mg | ORAL_TABLET | Freq: Once | ORAL | Status: AC
  Administered 2022-04-16: 975 mg via ORAL

## 2022-04-16 MED ORDER — LORATADINE 10 MG PO TABS: 10.0000 mg | ORAL_TABLET | Freq: Every day | ORAL | 1 refills | Status: AC

## 2022-04-16 MED ORDER — ACETAMINOPHEN 325 MG PO TABS
ORAL_TABLET | ORAL | Status: AC
  Filled 2022-04-16: qty 3

## 2022-04-16 MED ORDER — PREDNISONE 20 MG PO TABS: 40.0000 mg | ORAL_TABLET | Freq: Every day | ORAL | 0 refills | Status: AC

## 2022-04-16 NOTE — Discharge Instructions (Addendum)
COVID-19, influenza, and RSV testing is pending. We will call you if results are positive and treat accordingly.   Your chest x-ray does not show any evidence of acute pneumonia.  In the meantime, we will treat this as a COPD exacerbation triggered by viral illness.  Take azithromycin antibiotic as prescribed (2 tablets today, then 1 tablet for the next 4 days).   Take prednisone steroid 40mg  once daily for the next 5 days with breakfast. Take this with food to avoid stomach upset.  You may continue to take tylenol as needed for fever, chills, and body aches/sore throat.   You may continue using your albuterol rescue inhaler as needed.   It may be helpful for you to take claritin to dry up some of your nasal drainage contributing to cough.   You may use tessalon pearles every 8 hours as needed for cough as well.   Continue to drink plenty of water/fluids to stay well hydrated.   If you develop any new or worsening symptoms or do not improve in the next 2 to 3 days, please return.  If your symptoms are severe, please go to the emergency room.  Follow-up with your primary care provider for further evaluation and management of your symptoms as well as ongoing wellness visits.  I hope you feel better!

## 2022-04-16 NOTE — ED Triage Notes (Signed)
Pts wife states he lives in a nursing home and he complains that he has runny nose and a little harder time breathing since Tuesday. He is on constant oxygen due to COPD but he says he is having to use more over the last few days. Denies any fever. He has been coughing.

## 2022-04-17 NOTE — ED Provider Notes (Signed)
[facility]    CSN: [id number] Arrival date & time: 04/16/22  1042      History   Chief Complaint Chief Complaint  Patient presents with   Cough   Nasal Congestion    HPI Daniel Oconnell is a 67 y.o. male.   Patient presents to urgent care with his wife for evaluation of cough, rhinorrhea, and generalized malaise for the last 2 days. He reports some cold chills as well without documented fever. Cough started yesterday and is dry, but patient feels like he has mucous in his chest that is "trying to get out but can't". Uses 3-4L of oxygen via nasal cannula at baseline due to COPD, states he has felt more winded than usual over the last few days and has been using 4L O2 more than 3L. No known sick contacts, although he does live in a nursing home in assisted living [facility]) and there was a COVID-19 outbreak at his place of living last month. Denies recent COPD exacerbation or hospitalization related to COPD/respiratory illness. Last hospitalization was in 2021 for acute respiratory failure with hypoxia related to COPD and community acquired pneumonia. Former smoker, quit in 2013. He is vaccinated against COVID-19 and has received this year's influenza vaccine. Denies body aches, dizziness, nausea, vomiting, diarrhea, abdominal pain, sore throat, ear pain, headache, vision changes, rash, chest pain, and new neurologic symptoms. History of CVA with residual expressive aphasia. No recent antibiotics or steroids. He uses daily maintenance inhaler as prescribed as well as albuterol inhaler as needed for shortness of breath.    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     Hyperlipidemia    Hypertension     Patient Active Problem List   Diagnosis Date Noted   COPD, severe (HCC) 12/24/2021   ILD (interstitial lung disease) (HCC) 12/24/2021   Centrilobular emphysema (HCC) 11/11/2021   Chronic respiratory failure with hypoxia (HCC) 11/11/2021   Diastolic CHF (HCC) 11/11/2021   DOE (dyspnea on exertion) 08/13/2021   Community acquired bacterial pneumonia 08/13/2019   History of seizure 08/13/2019   OSA (obstructive sleep apnea) 08/13/2019   Cerebrovascular accident (CVA) (HCC) 02/24/2019   Dyslipidemia 02/23/2019   Leg swelling 03/10/2017   Medication management 03/10/2017   Hyperlipidemia 03/10/2017   Adrenal abnormality (HCC)    Expressive aphasia    Acute respiratory failure with hypoxia (HCC) 04/17/2016   Abdominal pain, acute, right lower quadrant 04/17/2016   Nonspecific abnormal electrocardiogram (ECG) (EKG) 04/17/2016   H/O: CVA (cerebrovascular accident) 04/17/2016   COPD with acute exacerbation (HCC) 04/17/2016   CAD (coronary artery disease) 04/17/2016   Essential hypertension 04/17/2016   Right lower quadrant abdominal pain    HCAP (healthcare-associated pneumonia) 04/16/2016    Past Surgical History:  Procedure Laterality Date   APPENDECTOMY     CORONARY ARTERY BYPASS GRAFT  2001   LUMBAR SPINE SURGERY         Home Medications    Prior to Admission medications   Medication Sig Start Date End Date Taking? Authorizing Provider  albuterol (VENTOLIN HFA) 108 (90 Base) MCG/ACT inhaler Inhale 1-2 puffs into the lungs every 6 (six) hours as needed  for wheezing or shortness of breath. 03/26/22  Yes Martina Sinner, MD  ASPIRIN 81 PO Take 81 mg by mouth daily.   Yes [provider]  atorvastatin (LIPITOR) 80 MG tablet Take 80 mg by mouth at bedtime.    Yes [provider]  azithromycin (ZITHROMAX) 250 MG tablet Take 1 tablet (250 mg total) by mouth daily. Take first 2 tablets together, then 1 every day until  finished. 04/16/22  Yes Carlisle Beers, FNP  baclofen (LIORESAL) 20 MG tablet Take 20 mg by mouth 3 (three) times daily.   Yes [provider]  benzonatate (TESSALON) 100 MG capsule Take 1 capsule (100 mg total) by mouth every 8 (eight) hours. 04/16/22  Yes Chiquetta Langner, Donavan Burnet, FNP  BREZTRI AEROSPHERE 160-9-4.8 MCG/ACT AERO Inhale into the lungs. 01/12/22  Yes [provider]  clopidogrel (PLAVIX) 75 MG tablet Take 75 mg by mouth daily with breakfast.    Yes [provider]  dicyclomine (BENTYL) 10 MG capsule Take 10 mg by mouth daily.   Yes [provider]  docusate sodium (COLACE) 100 MG capsule Take 100 mg by mouth 2 (two) times daily.   Yes [provider]  guaifenesin (ROBITUSSIN) 100 MG/5ML syrup Take 200 mg by mouth every 6 (six) hours as needed for cough.   Yes [provider]  hydrALAZINE (APRESOLINE) 100 MG tablet Take 1 tablet (100 mg total) by mouth 3 (three) times daily. 08/14/21  Yes Rollene Rotunda, MD  levETIRAcetam (KEPPRA) 750 MG tablet Take 750 mg by mouth 2 (two) times daily.   Yes [provider]  levothyroxine (SYNTHROID, LEVOTHROID) 25 MCG tablet Take 1 tablet by mouth daily. 03/25/18  Yes [provider]  loperamide (IMODIUM) 2 MG capsule Take by mouth as needed for diarrhea or loose stools.   Yes [provider]  loratadine (CLARITIN) 10 MG tablet Take 1 tablet (10 mg total) by mouth daily. 04/16/22  Yes Carlisle Beers, FNP  magnesium hydroxide (MILK OF MAGNESIA) 400 MG/5ML suspension Take 30 mLs by mouth at bedtime as needed for mild constipation.   Yes [provider]  melatonin 3 MG TABS tablet Take 3 mg by mouth at bedtime.   Yes [provider]  neomycin-bacitracin-polymyxin (NEOSPORIN) ointment Apply 1 application. topically as needed for wound care.   Yes [provider]  Polyethyl Glycol-Propyl Glycol (SYSTANE ULTRA) 0.4-0.3 % SOLN Apply 1 drop to  eye 3 (three) times daily.   Yes [provider]  polyethylene glycol powder (GLYCOLAX/MIRALAX) powder Take 17 g by mouth 2 (two) times daily. Use for constipation, as prescribed, until daily soft stools  OTC 04/15/16  Yes Antony Madura, PA-C  potassium chloride (KLOR-CON M) 10 MEQ tablet Take 2 tablets (20 mEq total) by mouth daily. 10/27/21  Yes Rollene Rotunda, MD  predniSONE (DELTASONE) 20 MG tablet Take 2 tablets (40 mg total) by mouth daily for 5 days. 04/16/22 04/21/22 Yes Carlisle Beers, FNP  sertraline (ZOLOFT) 100 MG tablet Take 100 mg by mouth daily with breakfast.    Yes [provider]  tamsulosin (FLOMAX) 0.4 MG CAPS capsule Take 1 capsule (0.4 mg total) by mouth daily. 04/22/16  Yes Johnson, Clanford L, MD  Torsemide 60 MG TABS Take 40 mg in the morning and 20 mg in the afternoon. 03/18/22  Yes Monge, Petra Kuba, NP  traZODone (DESYREL) 100 MG tablet Take 100 mg by mouth at bedtime. 06/22/19  Yes [provider]  vitamin B-12 (CYANOCOBALAMIN)  1000 MCG tablet Take 1,000 mcg by mouth daily.   Yes [provider]  acetaminophen (TYLENOL) 500 MG tablet Take 500 mg by mouth every 4 (four) hours as needed for mild pain, moderate pain, fever or headache.    [provider]  albuterol (PROVENTIL) (2.5 MG/3ML) 0.083% nebulizer solution Take 3 mLs (2.5 mg total) by nebulization every 4 (four) hours as needed for wheezing or shortness of breath. 04/22/16   Johnson, Clanford L, MD  alum & mag hydroxide-simeth (MAALOX/MYLANTA) 200-200-20 MG/5ML suspension Take 30 mLs by mouth as needed for indigestion or heartburn.    [provider]  mometasone (NASONEX) 50 MCG/ACT nasal spray SMARTSIG:Both Nares 08/04/21   [provider]  potassium chloride SA (KLOR-CON M) 20 MEQ tablet Take 2 tablets (40 mEq total) by mouth daily for 3 days. 04/06/22 04/09/22  Joylene Grapes, NP    Family History Family History  Problem Relation Age of Onset   CVA  Mother 93   Aneurysm Father     Social History Social History   Tobacco Use   Smoking status: Former    Packs/day: 1.00    Years: 28.00    Total pack years: 28.00    Types: Cigarettes    Quit date: 2013    Years since quitting: 10.9   Smokeless tobacco: Never  Substance Use Topics   Alcohol use: No    Comment: h/o heavy ETOH use   Drug use: No    Types: Cocaine, Marijuana    Comment: h/o drug use     Allergies   Clonidine and Sulfa antibiotics   Review of Systems Review of Systems  Respiratory:  Positive for cough.   Per HPI   Physical Exam Triage Vital Signs ED Triage Vitals  Enc Vitals Group     BP 04/16/22 1207 (!) 130/59     Pulse Rate 04/16/22 1207 78     Resp --      Temp 04/16/22 1207 99.4 F (37.4 C)     Temp Source 04/16/22 1207 Oral     SpO2 04/16/22 1207 94 %     Weight --      Height --      Head Circumference --      Peak Flow --      Pain Score 04/16/22 1201 0     Pain Loc --      Pain Edu? --      Excl. in GC? --    No data found.  Updated Vital Signs BP (!) 130/59 (BP Location: Left Arm)   Pulse 78   Temp 99.4 F (37.4 C) (Oral)   SpO2 94%   Visual Acuity Right Eye Distance:   Left Eye Distance:   Bilateral Distance:    Right Eye Near:   Left Eye Near:    Bilateral Near:     Physical Exam Vitals and nursing note reviewed.  Constitutional:      Appearance: He is not ill-appearing or toxic-appearing.  HENT:     Head: Normocephalic and atraumatic.     Right Ear: Hearing, tympanic membrane, ear canal and external ear normal.     Left Ear: Hearing, tympanic membrane, ear canal and external ear normal.     Nose: Rhinorrhea present. Rhinorrhea is clear and purulent.     Right Turbinates: Swollen.     Left Turbinates: Swollen.     Mouth/Throat:     Lips: Pink.  Eyes:     General: Lids  are normal. Vision grossly intact. Gaze aligned appropriately.     Extraocular Movements: Extraocular movements intact.      Conjunctiva/sclera: Conjunctivae normal.  Cardiovascular:     Rate and Rhythm: Normal rate and regular rhythm.     Heart sounds: Normal heart sounds, S1 normal and S2 normal.  Pulmonary:     Effort: Pulmonary effort is normal. Prolonged expiration present. No respiratory distress.     Breath sounds: Normal air entry. Examination of the right-lower field reveals wheezing and rhonchi. Examination of the left-lower field reveals wheezing and rhonchi. Decreased breath sounds, wheezing and rhonchi present. No rales.     Comments: Generalized decreased breath sounds with faint expiratory wheeze and rhonchi to the bilateral lower lung fields. Prolonged expiration present without respiratory distress. Breathing comfortably on 4L O2 in chair without tripoding or retractions.  Musculoskeletal:     Cervical back: Full passive range of motion without pain and neck supple.  Lymphadenopathy:     Cervical: Cervical adenopathy present.  Skin:    General: Skin is warm and dry.     Capillary Refill: Capillary refill takes less than 2 seconds.     Findings: No rash.  Neurological:     General: No focal deficit present.     Mental Status: He is alert and oriented to person, place, and time. Mental status is at baseline.     Cranial Nerves: No dysarthria or facial asymmetry.     Comments: Neurologically at baseline. Ambulatory with steady gait without assistance.  Psychiatric:        Mood and Affect: Mood normal.        Speech: Speech normal.        Behavior: Behavior normal.        Thought Content: Thought content normal.        Judgment: Judgment normal.      UC Treatments / Results  Labs (all labs ordered are listed, but only abnormal results are displayed) Labs Reviewed  RESP PANEL BY RT-PCR (RSV, FLU A&B, COVID)  [id number]  POC INFLUENZA A AND B ANTIGEN (URGENT CARE ONLY)    EKG   Radiology DG Chest 2 View  Result Date: 04/16/2022 CLINICAL DATA:  Cough, shortness of breath EXAM: CHEST - 2  VIEW COMPARISON:  Prior chest x-ray 08/12/2021 FINDINGS: Stable cardiac and mediastinal contours. Patient is status post median sternotomy with evidence of prior multivessel CABG. Atherosclerotic calcifications present in the transverse aorta. Similar appearance of pleuroparenchymal scarring in the right mid lung and perihilar region. Chronic pleural thickening versus small pleural effusion on the right. Diffuse interstitial airspace opacities are similar compared to prior. No pneumothorax. No new focal airspace infiltrate. IMPRESSION: Stable chest x-ray without evidence of acute cardiopulmonary process. Electronically Signed   By: Malachy Moan M.D.   On: 04/16/2022 13:12    Procedures Procedures (including critical care time)  Medications Ordered in UC Medications  acetaminophen (TYLENOL) tablet 975 mg (975 mg Oral Given 04/16/22 1315)  albuterol (PROVENTIL) (2.5 MG/3ML) 0.083% nebulizer solution 2.5 mg (2.5 mg Nebulization Given 04/16/22 1315)    Initial Impression / Assessment and Plan / UC Course  I have reviewed the triage vital signs and the nursing notes.  Pertinent labs & imaging results that were available during my care of the patient were reviewed by me and considered in my medical decision making (see chart for details).   1. Viral URI with cough, COPD exacerbation Symptoms and physical exam consistent with a viral upper respiratory tract  infection that will likely resolve with rest, fluids, and prescriptions for symptomatic relief.  We will manage this as a COPD exacerbation likely triggered by viral URI.   Influenza testing in clinic is negative. COVID-19, influenza, and RSV testing is pending as patient as at high risk for severe disease due to comorbidities.  We will call patient if this is positive.  Quarantine guidelines discussed. Currently on day 3 of symptoms and does qualify for antiviral therapy.   Chest x-ray shows stable findings without acute cardiopulmonary  finding. No pneumothorax, pneumonia, or pleural effusion.He is nontoxic in appearance and there is no indication for referral to higher level of care at this time. Vitals remain stable throughout urgent care visit, stable for discharge home with strict ER return precautions.   Patient given tylenol in clinic today for body aches.  Given albuterol nebulizer treatment in clinic and expresses significant improvement in subjective shortness of breath afterwards. Lung sounds improved after breathing treatment as well.   Prescribed medications:  Tessalon pearles every 8 hours as needed for cough. Azithromycin antibiotic 2 tablets today, then 1 tablet until finished. Prednisone 40mg  burst to be started tomorrow morning due to inflammation and wheeze.  Claritin daily for 14 days may help to dry up nasal drainage contributing to rhinorrhea and cough.  May continue use of as needed tylenol 1000mg  every 6 hours for aches/pains, fever, and chills.  Strict ED/urgent care return precautions given.  Patient verbalizes understanding and agreement with plan.  Counseled patient regarding possible side effects and uses of all medications prescribed at today's visit.  Patient verbalizes understanding and agreement with plan.  All questions answered.  Patient discharged from urgent care in stable condition.    If viral testing is positive, wife Shean Gerding) would like to be notified by calling [phone].  Please also call assisted living center [facility]) at [phone] and ask to speak to [place]. If need for molnupiravir prescription due to COVID-19, please fax this prescription to [facility] pharmacy at [phone].    Final Clinical Impressions(s) / UC Diagnoses   Final diagnoses:  Viral URI with cough  Rhinorrhea  COPD exacerbation (HCC)     Discharge Instructions      COVID-19, influenza, and RSV testing is pending. We will call you if results are positive and treat accordingly.   Your  chest x-ray does not show any evidence of acute pneumonia.  In the meantime, we will treat this as a COPD exacerbation triggered by viral illness.  Take azithromycin antibiotic as prescribed (2 tablets today, then 1 tablet for the next 4 days).   Take prednisone steroid 40mg  once daily for the next 5 days with breakfast. Take this with food to avoid stomach upset.  You may continue to take tylenol as needed for fever, chills, and body aches/sore throat.   You may continue using your albuterol rescue inhaler as needed.   It may be helpful for you to take claritin to dry up some of your nasal drainage contributing to cough.   You may use tessalon pearles every 8 hours as needed for cough as well.   Continue to drink plenty of water/fluids to stay well hydrated.   If you develop any new or worsening symptoms or do not improve in the next 2 to 3 days, please return.  If your symptoms are severe, please go to the emergency room.  Follow-up with your primary care provider for further evaluation and management of your symptoms as well as  ongoing wellness visits.  I hope you feel better!    ED Prescriptions     Medication Sig Dispense Auth. Provider   loratadine (CLARITIN) 10 MG tablet Take 1 tablet (10 mg total) by mouth daily. 30 tablet Carlisle BeersStanhope, Raidyn Wassink M, FNP   azithromycin (ZITHROMAX) 250 MG tablet Take 1 tablet (250 mg total) by mouth daily. Take first 2 tablets together, then 1 every day until finished. 6 tablet Reita MayStanhope, Deandria Klute M, FNP   predniSONE (DELTASONE) 20 MG tablet Take 2 tablets (40 mg total) by mouth daily for 5 days. 10 tablet Reita MayStanhope, Jaqlyn Gruenhagen M, FNP   benzonatate (TESSALON) 100 MG capsule Take 1 capsule (100 mg total) by mouth every 8 (eight) hours. 21 capsule Carlisle BeersStanhope, Giulianna Rocha M, FNP      PDMP not reviewed this encounter.   Carlisle BeersStanhope, Tramayne Sebesta M, [place] 04/17/22 1442

## 2022-05-05 LAB — BASIC METABOLIC PANEL
BUN/Creatinine Ratio: 10 (ref 10–24)
BUN: 11 mg/dL (ref 8–27)
CO2: 30 mmol/L — ABNORMAL HIGH (ref 20–29)
Calcium: 8.5 mg/dL — ABNORMAL LOW (ref 8.6–10.2)
Chloride: 98 mmol/L (ref 96–106)
Creatinine, Ser: 1.06 mg/dL (ref 0.76–1.27)
Glucose: 80 mg/dL (ref 70–99)
Potassium: 3.1 mmol/L — ABNORMAL LOW (ref 3.5–5.2)
Sodium: 140 mmol/L (ref 134–144)
eGFR: 76 mL/min/{1.73_m2} (ref >59)

## 2022-05-08 ENCOUNTER — Telehealth: Payer: Self-pay

## 2022-05-08 DIAGNOSIS — E876 Hypokalemia: Secondary | ICD-10-CM

## 2022-05-08 DIAGNOSIS — Z79899 Other long term (current) drug therapy: Secondary | ICD-10-CM

## 2022-05-08 MED ORDER — POTASSIUM CHLORIDE CRYS ER 20 MEQ PO TBCR: 40.0000 meq | EXTENDED_RELEASE_TABLET | Freq: Every day | ORAL | 2 refills | Status: DC

## 2022-05-08 NOTE — Telephone Encounter (Signed)
Left a detailed message for pts family. Potassium remains low. Pt needs to increase his potassium to 40 mEq daily and repeat lab work in 2 weeks. RX and lab order placed.   Medication was sent to pts pharmacy [facility] (Phone number [phone]). Order was faxed to the [facility] [phone].

## 2022-06-22 NOTE — Progress Notes (Unsigned)
Cardiology Office Note   Date:  06/22/2022   ID:  [name], [name] 07/11/1955, MRN [id number]  PCP:  Jeanette Caprice, PA-C  Cardiologist:   Minus Breeding, MD   No chief complaint on file.     History of Present Illness: Daniel Oconnell is a 67 y.o. male who presents who presents for follow-up of coronary disease. He had a history of bypass surgery in 2001 at [facility].   He did have a stroke in 2001 as well.  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 He had carotid stenting.      He did have COVID in August 2020.   He was admitted in March 2021 with COPD exacerbation.  He lives at a nursing home.  He does walk although he has right hemiparesis.   At the last visit he had increased dyspnea and I added Lasix.   His EF was normal on echo.   BNP was elevated.  PYP did not suggest amyloid.  ***   *** He has been getting some increasing shortness of breath and is on 3 L of oxygen chronically.  He does have COPD.  He gets around in a wheelchair since his stroke.  He lives in a nursing home.  At the last visit because of his dyspnea I changed him from Lasix to Torsemide.  It seems like he has been getting both secondary to the nursing home not removing the lasix.  Despite this he has not had any improvement in his dyspnea.  He is still requiring 3 liters most of the time.  He denies any fevers, cough.  No PND or orthopnea.    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     albuterol (PROVENTIL) (2.5 MG/3ML) 0.083% nebulizer solution Take 3 mLs (2.5 mg total) by nebulization every 4 (four) hours as needed for wheezing or shortness of breath. 75 mL 0   albuterol (VENTOLIN HFA) 108 (90 Base) MCG/ACT inhaler Inhale 1-2 puffs into the lungs every 6 (six) hours as needed for wheezing or shortness of breath. 8 g 6   alum & mag hydroxide-simeth (MAALOX/MYLANTA) 200-200-20 MG/5ML suspension Take 30 mLs by mouth as needed for indigestion or heartburn.     ASPIRIN 81 PO Take 81 mg by mouth daily.     atorvastatin (LIPITOR) 80 MG tablet Take 80 mg by mouth at bedtime.      azithromycin (ZITHROMAX) 250 MG tablet Take 1 tablet (250 mg total) by mouth daily. Take first 2 tablets together, then 1 every day until finished. 6 tablet 0   baclofen (LIORESAL) 20 MG tablet Take 20 mg by mouth 3 (three) times daily.  benzonatate (TESSALON) 100 MG capsule Take 1 capsule (100 mg total) by mouth every 8 (eight) hours. 21 capsule 0   BREZTRI AEROSPHERE 160-9-4.8 MCG/ACT AERO Inhale into the lungs.     clopidogrel (PLAVIX) 75 MG tablet Take 75 mg by mouth daily with breakfast.      dicyclomine (BENTYL) 10 MG capsule Take 10 mg by mouth daily.     docusate sodium (COLACE) 100 MG capsule Take 100 mg by mouth 2 (two) times daily.     guaifenesin (ROBITUSSIN) 100 MG/5ML syrup Take 200 mg by mouth every 6 (six) hours as needed for cough.     hydrALAZINE (APRESOLINE) 100 MG tablet Take 1 tablet (100 mg total) by mouth 3 (three) times daily. 270 tablet 3   levETIRAcetam (KEPPRA) 750 MG tablet Take 750 mg by mouth 2 (two) times daily.     levothyroxine (SYNTHROID, LEVOTHROID) 25 MCG tablet Take 1 tablet by mouth daily.     loperamide (IMODIUM) 2 MG capsule Take by mouth as needed for diarrhea or loose stools.     loratadine  (CLARITIN) 10 MG tablet Take 1 tablet (10 mg total) by mouth daily. 30 tablet 1   magnesium hydroxide (MILK OF MAGNESIA) 400 MG/5ML suspension Take 30 mLs by mouth at bedtime as needed for mild constipation.     melatonin 3 MG TABS tablet Take 3 mg by mouth at bedtime.     mometasone (NASONEX) 50 MCG/ACT nasal spray SMARTSIG:Both Nares     neomycin-bacitracin-polymyxin (NEOSPORIN) ointment Apply 1 application. topically as needed for wound care.     Polyethyl Glycol-Propyl Glycol (SYSTANE ULTRA) 0.4-0.3 % SOLN Apply 1 drop to eye 3 (three) times daily.     polyethylene glycol powder (GLYCOLAX/MIRALAX) powder Take 17 g by mouth 2 (two) times daily. Use for constipation, as prescribed, until daily soft stools  OTC 119 g 0   potassium chloride SA (KLOR-CON M) 20 MEQ tablet Take 2 tablets (40 mEq total) by mouth daily. 180 tablet 2   sertraline (ZOLOFT) 100 MG tablet Take 100 mg by mouth daily with breakfast.      tamsulosin (FLOMAX) 0.4 MG CAPS capsule Take 1 capsule (0.4 mg total) by mouth daily. 30 capsule 0   Torsemide 60 MG TABS Take 40 mg in the morning and 20 mg in the afternoon. 84 tablet 3   traZODone (DESYREL) 100 MG tablet Take 100 mg by mouth at bedtime.     vitamin B-12 (CYANOCOBALAMIN) 1000 MCG tablet Take 1,000 mcg by mouth daily.     No current facility-administered medications for this visit.    Allergies:   Clonidine and Sulfa antibiotics    ROS:  Please see the history of present illness.   Otherwise, review of systems are positive for ***.   All other systems are reviewed and negative.    PHYSICAL EXAM: VS:  There were no vitals taken for this visit. , BMI There is no height or weight on file to calculate BMI. GENERAL:  Well appearing NECK:  No jugular venous distention, waveform within normal limits, carotid upstroke brisk and symmetric, no bruits, no thyromegaly LUNGS:  Clear to auscultation bilaterally CHEST:  Unremarkable HEART:  PMI not displaced or sustained,S1  and S2 within normal limits, no S3, no S4, no clicks, no rubs, *** murmurs ABD:  Flat, positive bowel sounds normal in frequency in pitch, no bruits, no rebound, no guarding, no midline pulsatile mass, no hepatomegaly, no splenomegaly EXT:  2 plus pulses throughout, no edema,  no cyanosis no clubbing    ***GEN:  No distress NECK:  No jugular venous distention at 90 degrees, waveform within normal limits, carotid upstroke brisk and symmetric, no bruits, no thyromegaly LUNGS:  Clear to auscultation bilaterally BACK:  No CVA tenderness CHEST:  Unremarkable HEART:  S1 and S2 within normal limits, no S3, no S4, no clicks, no rubs, no murmurs ABD:  Positive bowel sounds normal in frequency in pitch, no bruits, no rebound, no guarding, unable to assess midline mass or bruit with the patient seated. EXT:  2 plus pulses throughout, no edema, no cyanosis no clubbing  EKG:  EKG is ***ordered today. ***  Recent Labs: 07/24/2021: ALT 19; TSH 9.160 08/07/2021: NT-Pro BNP 3,009 01/30/2022: Hemoglobin 14.3; Platelets 220.0 05/04/2022: BUN 11; Creatinine, Ser 1.06; Potassium 3.1; Sodium 140    Lipid Panel    Component Value Date/Time   CHOL 112 07/24/2021 1113   TRIG 73 07/24/2021 1113   HDL 38 (L) 07/24/2021 1113   CHOLHDL 2.9 07/24/2021 1113   LDLCALC 59 07/24/2021 1113      Wt Readings from Last 3 Encounters:  03/16/22 185 lb (83.9 kg)  02/17/22 182 lb 12.2 oz (82.9 kg)  02/03/22 182 lb 8.7 oz (82.8 kg)      Other studies Reviewed Additional studies/ records that were reviewed today include: ***. Review of the above records demonstrates:  Please see elsewhere in the note.     ASSESSMENT AND PLAN:  CAD:  ***   The patient has no new sypmtoms.  No further cardiovascular testing is indicated.  We will continue with aggressive risk reduction and meds as listed.  HTN:  The blood pressure is *** at target.  No change in therapy.     CVA:   ***  He has a history of carotid stenting.    Continue with risk reduction.    SOB:    ***  He has had no improvement despite increased diuretic as above.  He does have PFTs pending.  No further work up.  I will make sure his Lasix is stopped.  Continue Torsemide and check a BMET.     DYSLIPIDEMIA:   LDL was *** recently was 59.  No change in therapy.      Current medicines are reviewed at length with the patient today.  The patient does not have concerns regarding medicines.  The following changes have been made:  ***  Labs/ tests ordered today include: ***  No orders of the defined types were placed in this encounter.    Disposition:   FU with me in *** months.     Signed, Minus Breeding, MD  06/22/2022 10:00 PM    [place]

## 2022-06-24 ENCOUNTER — Encounter: Payer: Self-pay | Admitting: Cardiology

## 2022-06-24 ENCOUNTER — Telehealth: Payer: Self-pay | Admitting: Cardiology

## 2022-06-24 ENCOUNTER — Ambulatory Visit: Payer: Medicare HMO | Attending: Cardiology | Admitting: Cardiology

## 2022-06-24 VITALS — BP 138/71 | HR 70 | Ht 70.0 in | Wt 185.6 lb

## 2022-06-24 DIAGNOSIS — I2510 Atherosclerotic heart disease of native coronary artery without angina pectoris: Secondary | ICD-10-CM | POA: Diagnosis not present

## 2022-06-24 DIAGNOSIS — I10 Essential (primary) hypertension: Secondary | ICD-10-CM

## 2022-06-24 DIAGNOSIS — E785 Hyperlipidemia, unspecified: Secondary | ICD-10-CM

## 2022-06-24 DIAGNOSIS — R0602 Shortness of breath: Secondary | ICD-10-CM | POA: Diagnosis not present

## 2022-06-24 NOTE — Telephone Encounter (Signed)
Called patient wife, advised that per Dr.Hochrein note:  CAD:   The patient has no new sypmtoms.  No further cardiovascular testing is indicated.  We will continue with aggressive risk reduction and meds as listed.  They want to reduce poly pharmacy and they can stop the ASA.    Patient wife was just concerned, I advised to just monitor for any symptoms or concerns and can call back if any other issues arise.  They verbalized understanding.

## 2022-06-24 NOTE — Addendum Note (Signed)
Addended by: Raiford Simmonds on: 06/24/2022 02:46 PM   Modules accepted: Orders

## 2022-06-24 NOTE — Telephone Encounter (Signed)
Pt c/o medication issue:  1. Name of Medication: aspirin   2. How are you currently taking this medication (dosage and times per day)?   3. Are you having a reaction (difficulty breathing--STAT)?   4. What is your medication issue? Manuela Schwartz called stating this medication was discontinued at today's visit. She states patient isn't very active, she wants to know if this would make patient more prone to blood clots now since he is not taking it.  Please advise.

## 2022-06-24 NOTE — Patient Instructions (Addendum)
Medication Instructions:  Stop taking aspirin   *If you need a refill on your cardiac medications before your next appointment, please call your pharmacy*   Lab Work: Not needed    Testing/Procedures:  Not needed  Follow-Up: At [facility], you and your health needs are our priority.  As part of our continuing mission to provide you with exceptional heart care, we have created designated Provider Care Teams.  These Care Teams include your primary Cardiologist (physician) and Advanced Practice Providers (APPs -  Physician Assistants and Nurse Practitioners) who all work together to provide you with the care you need, when you need it.     Your next appointment:   12 month(s)  The format for your next appointment:   In Person  Provider:   Minus Breeding, MD

## 2022-07-01 ENCOUNTER — Encounter: Payer: Self-pay | Admitting: Pulmonary Disease

## 2022-07-01 ENCOUNTER — Ambulatory Visit: Payer: Medicare HMO | Admitting: Pulmonary Disease

## 2022-07-01 VITALS — BP 118/74 | HR 69 | Ht 70.0 in | Wt 185.0 lb

## 2022-07-01 DIAGNOSIS — J439 Emphysema, unspecified: Secondary | ICD-10-CM

## 2022-07-01 DIAGNOSIS — J9611 Chronic respiratory failure with hypoxia: Secondary | ICD-10-CM

## 2022-07-01 NOTE — Patient Instructions (Addendum)
Continue breztri inhaler 2 puffs twice daily - rinse mouth out after each use  Continue albuterol inhaler 1-2 puffs every 4-6 hours as needed  Continue supplemental oxygen 2-4L   Follow up in 1 year, please call if needed sooner

## 2022-07-01 NOTE — Progress Notes (Signed)
Synopsis: Referred in March 2023 for COPD by Jeanette Caprice, PA  Subjective:   PATIENT ID: Daniel Oconnell GENDER: male DOB: 1955/09/28, MRN: [id number]   HPI  Chief Complaint  Patient presents with   Follow-up    6 mo f/u for emphysema. States his breathing has been stable since last visit.    Daniel Oconnell is a 67 year old male, former smoker with hypertension, coronary artery disease s/p CABG and CVA who returns to pulmonary clinic for COPD.   He has done well since last visit. He continues on Breztri 2 puffs twice daily and PRN albuterol inhaler. He continues to exercise at the [facility]. No acute issues at this time. He remains on oxygen 2-4L.  OV 01/23/22 He was seen by Roxan Diesel, NP 6/27 and 8/9. He was started on breztri at last visit. He has been doing well with pulmonary rehab. He feels his shortness of breath is improved at this time. He remains on 2-4L of supplemental oxygen.  CT Chest LCS 08/15/21 shows mild paraseptal and centrilobular emphysema. There is small right pleural effusion with pleural thickening and some pleural calcifications. Trace left sided pleural thickening with small amount of calcifications.  Hypersensitivity pneumonitis panel and ANCA screen were negative.    Initial OV 07/18/21 He is accompanied by his ex-wife, Daniel Oconnell. He has right sided deficit and does have degree of aphasia given his history of CVA. He reports being diagnosed with COPD many years ago and has been on supplemental oxygen since a hospitalization for pneumonia in 2017. He is currently using albuterol 2 puffs three times daily. He reports increasing dyspnea with his PT/OT sessions at [facility]. He lives at a nursing home. He is using 2.5L at rest and up to 4L with physical activity. He denies any cough or wheezing. He quit smoking in 2013 after his stroke. 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   He has  over a 20 pack year smoking history. He is disabled due to the stroke.   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    Hyperlipidemia    Hypertension      Family History  Problem Relation Age of Onset   CVA Mother 65   Aneurysm Father      Social History   Socioeconomic History   Marital status: Divorced    Spouse name: Not on file   Number of children: 2   Years of education: Not on file   Highest education level: Not on file  Occupational History   Occupation: Disabled    Comment: Geneticist, molecular  Tobacco Use   Smoking status: Former    Packs/day: 1.00    Years: 28.00    Total pack years: 28.00    Types: Cigarettes    Quit date: 2013    Years since quitting: 11.1   Smokeless tobacco: Never  Substance and Sexual Activity   Alcohol use: No    Comment: h/o heavy ETOH use   Drug use: No    Types: Cocaine, Marijuana    Comment: h/o drug use   Sexual activity: Not  Currently  Other Topics Concern   Not on file  Social History Narrative   Not on file   Social Determinants of Health   Financial Resource Strain: Not on file  Food Insecurity: Not on file  Transportation Needs: Not on file  Physical Activity: Not on file  Stress: Not on file  Social Connections: Not on file  Intimate Partner Violence: Not on file     Allergies  Allergen Reactions   Clonidine Diarrhea    Not listed on MAR   Sulfa Antibiotics Other (See Comments)    Unknown; not listed on MAR     Outpatient Medications Prior to Visit  Medication Sig Dispense Refill   acetaminophen (TYLENOL) 500 MG tablet Take 500 mg by mouth every 4 (four) hours as needed for mild pain, moderate pain, fever or  headache.     albuterol (PROVENTIL) (2.5 MG/3ML) 0.083% nebulizer solution Take 3 mLs (2.5 mg total) by nebulization every 4 (four) hours as needed for wheezing or shortness of breath. 75 mL 0   albuterol (VENTOLIN HFA) 108 (90 Base) MCG/ACT inhaler Inhale 1-2 puffs into the lungs every 6 (six) hours as needed for wheezing or shortness of breath. 8 g 6   alum & mag hydroxide-simeth (MAALOX/MYLANTA) 200-200-20 MG/5ML suspension Take 30 mLs by mouth as needed for indigestion or heartburn.     atorvastatin (LIPITOR) 80 MG tablet Take 80 mg by mouth at bedtime.      baclofen (LIORESAL) 20 MG tablet Take 20 mg by mouth 3 (three) times daily.     benzonatate (TESSALON) 100 MG capsule Take 1 capsule (100 mg total) by mouth every 8 (eight) hours. 21 capsule 0   BREZTRI AEROSPHERE 160-9-4.8 MCG/ACT AERO Inhale into the lungs.     clopidogrel (PLAVIX) 75 MG tablet Take 75 mg by mouth daily with breakfast.      dicyclomine (BENTYL) 10 MG capsule Take 10 mg by mouth daily.     docusate sodium (COLACE) 100 MG capsule Take 100 mg by mouth 2 (two) times daily.     guaifenesin (ROBITUSSIN) 100 MG/5ML syrup Take 200 mg by mouth every 6 (six) hours as needed for cough.     hydrALAZINE (APRESOLINE) 100 MG tablet Take 1 tablet (100 mg total) by mouth 3 (three) times daily. 270 tablet 3   levETIRAcetam (KEPPRA) 750 MG tablet Take 750 mg by mouth 2 (two) times daily.     levothyroxine (SYNTHROID, LEVOTHROID) 25 MCG tablet Take 1 tablet by mouth daily.     loperamide (IMODIUM) 2 MG capsule Take by mouth as needed for diarrhea or loose stools.     loratadine (CLARITIN) 10 MG tablet Take 1 tablet (10 mg total) by mouth daily. 30 tablet 1   magnesium hydroxide (MILK OF MAGNESIA) 400 MG/5ML suspension Take 30 mLs by mouth at bedtime as needed for mild constipation.     melatonin 3 MG TABS tablet Take 3 mg by mouth at bedtime.     mometasone (NASONEX) 50 MCG/ACT nasal spray      neomycin-bacitracin-polymyxin (NEOSPORIN)  ointment Apply 1 application. topically as needed for wound care.     Polyethyl Glycol-Propyl Glycol (SYSTANE ULTRA) 0.4-0.3 % SOLN Apply 1 drop to eye 3 (three) times daily.     polyethylene glycol powder (GLYCOLAX/MIRALAX) powder Take 17 g by mouth 2 (two) times daily. Use for constipation, as prescribed, until daily soft stools  OTC 119 g 0   potassium chloride SA (KLOR-CON M) 20 MEQ tablet Take  2 tablets (40 mEq total) by mouth daily. 180 tablet 2   sertraline (ZOLOFT) 100 MG tablet Take 100 mg by mouth daily with breakfast.      tamsulosin (FLOMAX) 0.4 MG CAPS capsule Take 1 capsule (0.4 mg total) by mouth daily. 30 capsule 0   Torsemide 60 MG TABS Take 40 mg in the morning and 20 mg in the afternoon. 84 tablet 3   traZODone (DESYREL) 100 MG tablet Take 100 mg by mouth at bedtime.     vitamin B-12 (CYANOCOBALAMIN) 1000 MCG tablet Take 1,000 mcg by mouth daily.     azithromycin (ZITHROMAX) 250 MG tablet Take 1 tablet (250 mg total) by mouth daily. Take first 2 tablets together, then 1 every day until finished. (Patient not taking: Reported on 06/24/2022) 6 tablet 0   No facility-administered medications prior to visit.   Review of Systems  Constitutional:  Negative for chills, fever, malaise/fatigue and weight loss.  HENT:  Negative for congestion, sinus pain and sore throat.   Eyes: Negative.   Respiratory:  Positive for shortness of breath. Negative for cough, hemoptysis, sputum production and wheezing.   Cardiovascular:  Negative for chest pain, palpitations, orthopnea, claudication and leg swelling.  Gastrointestinal:  Negative for abdominal pain, heartburn, nausea and vomiting.  Genitourinary: Negative.   Musculoskeletal:  Negative for joint pain and myalgias.  Skin:  Negative for rash.  Neurological:  Negative for weakness.  Endo/Heme/Allergies: Negative.   Psychiatric/Behavioral: Negative.      Objective:   Vitals:   07/01/22 1406  BP: 118/74  Pulse: 69  SpO2: 96%   Weight: 185 lb (83.9 kg)  Height: 5' 10"$  (1.778 m)   Physical Exam Constitutional:      General: He is not in acute distress.    Comments: Nasal canula in place, POC  HENT:     Head: Normocephalic and atraumatic.  Cardiovascular:     Rate and Rhythm: Normal rate and regular rhythm.     Pulses: Normal pulses.     Heart sounds: Normal heart sounds. No murmur heard. Pulmonary:     Effort: Pulmonary effort is normal.     Breath sounds: Decreased breath sounds present. No wheezing, rhonchi or rales.  Musculoskeletal:     Right lower leg: No edema.     Left lower leg: No edema.  Skin:    General: Skin is warm and dry.  Neurological:     General: No focal deficit present.     Mental Status: He is alert.     CBC    Component Value Date/Time   WBC 10.8 (H) 01/30/2022 1100   RBC 4.98 01/30/2022 1100   HGB 14.3 01/30/2022 1100   HGB 14.7 07/24/2021 1113   HCT 41.8 01/30/2022 1100   HCT 43.7 07/24/2021 1113   PLT 220.0 01/30/2022 1100   PLT 204 07/24/2021 1113   MCV 83.9 01/30/2022 1100   MCV 85 07/24/2021 1113   MCH 28.4 07/24/2021 1113   MCH 28.6 08/15/2019 0545   MCHC 34.2 01/30/2022 1100   RDW 14.3 01/30/2022 1100   RDW 13.5 07/24/2021 1113   LYMPHSABS 1.5 01/30/2022 1100   LYMPHSABS 1.8 07/24/2021 1113   MONOABS 0.8 01/30/2022 1100   EOSABS 0.2 01/30/2022 1100   EOSABS 0.2 07/24/2021 1113   BASOSABS 0.0 01/30/2022 1100   BASOSABS 0.1 07/24/2021 1113      Latest Ref Rng & Units 05/04/2022    2:18 PM 04/02/2022    2:12 PM 11/19/2021  4:15 PM  BMP  Glucose 70 - 99 mg/dL 80  63  98   BUN 8 - 27 mg/dL 11  15  13   $ Creatinine 0.76 - 1.27 mg/dL 1.06  1.08  1.03   BUN/Creat Ratio 10 - 24 10  14  13   $ Sodium 134 - 144 mmol/L 140  142  140   Potassium 3.5 - 5.2 mmol/L 3.1  3.2  3.9   Chloride 96 - 106 mmol/L 98  94  96   CO2 20 - 29 mmol/L 30  31  29   $ Calcium 8.6 - 10.2 mg/dL 8.5  9.2  9.2    Chest imaging: CT Chest 08/15/21 Mediastinum/Nodes: No pathologically  enlarged mediastinal or hilar lymph nodes. Please note that accurate exclusion of hilar adenopathy is limited on noncontrast CT scans. Esophagus is unremarkable in appearance. No axillary lymphadenopathy.   Lungs/Pleura: No suspicious appearing pulmonary nodules or masses are noted. No acute consolidative airspace disease. Small right pleural effusion lying dependently with pleural thickening and some pleural calcifications. Trace amount of left-sided pleural thickening as well, also with a small amount of pleural calcifications in the left hemithorax. Mild diffuse bronchial wall thickening with very mild centrilobular and paraseptal emphysema.    CXR 08/13/19 Single frontal view of the chest demonstrates postsurgical changes from median sternotomy. The cardiac silhouette is stable. There is chronic central vascular congestion and diffuse interstitial prominence. No airspace disease, effusion, or pneumothorax.  CTA Chest 04/16/16 Mediastinum/Nodes: No mediastinal lymphadenopathy. There is no hilar lymphadenopathy. Borderline lymphadenopathy is identified in the right hilum. The esophagus has normal imaging features. There is no axillary lymphadenopathy.   Lungs/Pleura: Emphysema noted bilaterally with interlobular septal thickening and bronchial wall thickening. Dependent collapse/consolidation is identified in both lower lobes and small bilateral pleural effusions are evident.  PFT:    Latest Ref Rng & Units 12/24/2021   10:55 AM  PFT Results  FVC-Pre L 2.84   FVC-Predicted Pre % 70   FVC-Post L 2.77   FVC-Predicted Post % 69   Pre FEV1/FVC % % 62   Post FEV1/FCV % % 62   FEV1-Pre L 1.75   FEV1-Predicted Pre % 59   FEV1-Post L 1.72   DLCO uncorrected ml/min/mmHg 8.89   DLCO UNC% % 37   DLCO corrected ml/min/mmHg 8.89   DLCO COR %Predicted % 37   DLVA Predicted % 51   TLC L 4.27   TLC % Predicted % 66   RV % Predicted % 59    Labs:  Path:  Echo:  Heart  Catheterization:  Assessment & Plan:   No diagnosis found.  Discussion: Daniel Oconnell is a 67 year old male, former smoker with hypertension, coronary artery disease s/p CABG and CVA who returns to pulmonary clinic for COPD and chronic respiratory failure.   CT chest scan notes mild centrilobular and paraseptal emphyesma. 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   He is to continue breztri inhaler 2 puffs twice daily.   He is to continue supplemental oxygen, 2-4L with a goal O2 saturation around 92%.   Follow up in 1 year.  Freda Jackson, MD [facility] Office: [phone]    Current Outpatient Medications:    acetaminophen (TYLENOL) 500 MG tablet, Take 500 mg by mouth every 4 (four) hours as needed for mild pain, moderate pain, fever or headache., Disp: , Rfl:    albuterol (PROVENTIL) (  2.5 MG/3ML) 0.083% nebulizer solution, Take 3 mLs (2.5 mg total) by nebulization every 4 (four) hours as needed for wheezing or shortness of breath., Disp: 75 mL, Rfl: 0   albuterol (VENTOLIN HFA) 108 (90 Base) MCG/ACT inhaler, Inhale 1-2 puffs into the lungs every 6 (six) hours as needed for wheezing or shortness of breath., Disp: 8 g, Rfl: 6   alum & mag hydroxide-simeth (MAALOX/MYLANTA) 200-200-20 MG/5ML suspension, Take 30 mLs by mouth as needed for indigestion or heartburn., Disp: , Rfl:    atorvastatin (LIPITOR) 80 MG tablet, Take 80 mg by mouth at bedtime. , Disp: , Rfl:    baclofen (LIORESAL) 20 MG tablet, Take 20 mg by mouth 3 (three) times daily., Disp: , Rfl:    benzonatate (TESSALON) 100 MG capsule, Take 1 capsule (100 mg total) by mouth every 8 (eight) hours., Disp: 21 capsule, Rfl: 0   BREZTRI AEROSPHERE 160-9-4.8 MCG/ACT AERO, Inhale into the lungs., Disp: , Rfl:    clopidogrel (PLAVIX) 75 MG tablet, Take 75 mg by mouth daily with breakfast. , Disp: , Rfl:    dicyclomine (BENTYL) 10  MG capsule, Take 10 mg by mouth daily., Disp: , Rfl:    docusate sodium (COLACE) 100 MG capsule, Take 100 mg by mouth 2 (two) times daily., Disp: , Rfl:    guaifenesin (ROBITUSSIN) 100 MG/5ML syrup, Take 200 mg by mouth every 6 (six) hours as needed for cough., Disp: , Rfl:    hydrALAZINE (APRESOLINE) 100 MG tablet, Take 1 tablet (100 mg total) by mouth 3 (three) times daily., Disp: 270 tablet, Rfl: 3   levETIRAcetam (KEPPRA) 750 MG tablet, Take 750 mg by mouth 2 (two) times daily., Disp: , Rfl:    levothyroxine (SYNTHROID, LEVOTHROID) 25 MCG tablet, Take 1 tablet by mouth daily., Disp: , Rfl:    loperamide (IMODIUM) 2 MG capsule, Take by mouth as needed for diarrhea or loose stools., Disp: , Rfl:    loratadine (CLARITIN) 10 MG tablet, Take 1 tablet (10 mg total) by mouth daily., Disp: 30 tablet, Rfl: 1   magnesium hydroxide (MILK OF MAGNESIA) 400 MG/5ML suspension, Take 30 mLs by mouth at bedtime as needed for mild constipation., Disp: , Rfl:    melatonin 3 MG TABS tablet, Take 3 mg by mouth at bedtime., Disp: , Rfl:    mometasone (NASONEX) 50 MCG/ACT nasal spray, , Disp: , Rfl:    neomycin-bacitracin-polymyxin (NEOSPORIN) ointment, Apply 1 application. topically as needed for wound care., Disp: , Rfl:    Polyethyl Glycol-Propyl Glycol (SYSTANE ULTRA) 0.4-0.3 % SOLN, Apply 1 drop to eye 3 (three) times daily., Disp: , Rfl:    polyethylene glycol powder (GLYCOLAX/MIRALAX) powder, Take 17 g by mouth 2 (two) times daily. Use for constipation, as prescribed, until daily soft stools  OTC, Disp: 119 g, Rfl: 0   potassium chloride SA (KLOR-CON M) 20 MEQ tablet, Take 2 tablets (40 mEq total) by mouth daily., Disp: 180 tablet, Rfl: 2   sertraline (ZOLOFT) 100 MG tablet, Take 100 mg by mouth daily with breakfast. , Disp: , Rfl:    tamsulosin (FLOMAX) 0.4 MG CAPS capsule, Take 1 capsule (0.4 mg total) by mouth daily., Disp: 30 capsule, Rfl: 0   Torsemide 60 MG TABS, Take 40 mg in the morning and 20 mg in the  afternoon., Disp: 84 tablet, Rfl: 3   traZODone (DESYREL) 100 MG tablet, Take 100 mg by mouth at bedtime., Disp: , Rfl:    vitamin B-12 (CYANOCOBALAMIN) 1000 MCG tablet, Take  1,000 mcg by mouth daily., Disp: , Rfl:

## 2022-08-17 ENCOUNTER — Ambulatory Visit (HOSPITAL_BASED_OUTPATIENT_CLINIC_OR_DEPARTMENT_OTHER)
Admission: RE | Admit: 2022-08-17 | Discharge: 2022-08-17 | Disposition: A | Discharge status: Home / Self Care | Payer: Medicare HMO | Source: Ambulatory Visit | Attending: Acute Care | Admitting: Acute Care

## 2022-08-17 DIAGNOSIS — Z87891 Personal history of nicotine dependence: Secondary | ICD-10-CM | POA: Diagnosis not present

## 2022-08-17 DIAGNOSIS — Z122 Encounter for screening for malignant neoplasm of respiratory organs: Secondary | ICD-10-CM | POA: Insufficient documentation

## 2022-08-17 NOTE — Therapy (Signed)
OUTPATIENT OCCUPATIONAL THERAPY ORTHO EVALUATION  Patient Name: Daniel Oconnell MRN: [id number] DOB:03/16/1956, 67 y.o., male Today's Date: 08/18/2022  PCP: Francesco Runner, PA-C REFERRING PROVIDER:   Jeanette Caprice, PA-C    END OF SESSION:  OT End of Session - 08/18/22 1018     Visit Number 1    Number of Visits 4    Date for OT Re-Evaluation 09/17/22    Authorization Type [employer]    OT Start Time 0845    OT Stop Time 1015    OT Time Calculation (min) 90 min    Activity Tolerance Patient tolerated treatment well    Behavior During Therapy [facility] for tasks assessed/performed;Impulsive             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)    CAD (coronary artery disease) 2001   CABG   COPD (chronic obstructive pulmonary disease)    CVA (cerebral vascular accident) 2001, 2013   x2   Expressive aphasia    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    Hyperlipidemia    Hypertension    Past Surgical History:  Procedure Laterality Date   APPENDECTOMY     CORONARY ARTERY BYPASS GRAFT  2001   LUMBAR SPINE SURGERY     Patient Active Problem List   Diagnosis Date Noted   COPD, severe 12/24/2021   ILD (interstitial lung disease) 12/24/2021   Centrilobular emphysema 11/11/2021   Chronic respiratory failure with hypoxia 99991111   Diastolic CHF 99991111   DOE (dyspnea on exertion) 08/13/2021   Community acquired bacterial pneumonia 08/13/2019   History of seizure 08/13/2019   OSA (obstructive sleep apnea) 08/13/2019   Cerebrovascular accident (CVA) 02/24/2019   Dyslipidemia 02/23/2019   Leg swelling 03/10/2017   Medication management 03/10/2017   Hyperlipidemia 03/10/2017   Adrenal abnormality    Expressive aphasia    Acute respiratory failure with hypoxia 04/17/2016   Abdominal pain, acute, right lower quadrant 04/17/2016   Nonspecific abnormal electrocardiogram (ECG) (EKG)  04/17/2016   H/O: CVA (cerebrovascular accident) 04/17/2016   COPD with acute exacerbation 04/17/2016   CAD (coronary artery disease) 04/17/2016   Essential hypertension 04/17/2016   Right lower quadrant abdominal pain    HCAP (healthcare-associated pneumonia) 04/16/2016    ONSET DATE: 07/14/22 (Referral date)   REFERRING DIAG: R hemiplegia s/p stroke w/ right hand contrature   THERAPY DIAG:  Spastic hemiplegia of right dominant side as late effect of other cerebrovascular disease  Rationale for Evaluation and Treatment: Rehabilitation  SUBJECTIVE:   SUBJECTIVE STATEMENT: Pt aphasic but gave thumbs up for splint Pt accompanied by: significant other  PERTINENT HISTORY: CVA 2001 & 2013 w/ residual spastic hemiparesis, aphasic, apraxic, sensory impairment RUE, COPD  PRECAUTIONS: Other: 3L of O2  WEIGHT BEARING RESTRICTIONS: No  PAIN:  Are you having pain? No  FALLS: Has patient fallen in last 6 months? No  LIVING ENVIRONMENT: Lives with: lives with their spouse Has following equipment at home: None  PLOF: Independent with basic ADLs  PATIENT GOALS: Get a new splint   OBJECTIVE:   HAND DOMINANCE:  Lt handed since stroke (Rt handed prior to stroke)   ADLs: Overall ADLs: mod I for BADLS   UPPER EXTREMITY ROM:   RUE spasticity and no functional use since stroke in 2013  HAND FUNCTION: No functional use Rt hand except RUE as stabilizer against body  COORDINATION: No use Rt hand  SENSATION: Significantly impaired RUE  EDEMA: Min to moderate Rt hand  COGNITION: Overall cognitive status:  aphasic but understands and makes nonverbal jokes   OBSERVATIONS: Pt returns today for new resting hand splint. Has been using previous one at night   TODAY'S TREATMENT:                                                                                                                              DATE: 08/18/22  Fabricated and fitted new resting hand splint and issued.  Reinforcement piece added to resting hand splint, and adapted straps for wrist and fingers were made. Extensively reviewed splint wear and care with pt and wife and provided 3 copies. Pt/wife instructed to monitor skin closely the first few days d/t pt's lack of sensation before wearing a full night. Pt/wife also provided w/ extra straps    PATIENT EDUCATION: Education details: splint wear and care, skin checks Person educated: Patient and Spouse Education method: Explanation, Demonstration, and Handouts Education comprehension: verbalized understanding and returned demonstration  HOME EXERCISE PROGRAM: N/A  GOALS: Goals reviewed with patient? Yes   LONG TERM GOALS: Target date: 09/17/22  Independent with splint wear and care Baseline:  Goal status: IN PROGRESS    ASSESSMENT:  CLINICAL IMPRESSION: Patient is a 67 y.o. male who was seen today for occupational therapy evaluation for new resting hand splint s/p CVA in 2013 w/ residual Rt spastic hemiplegia. Pt is familiar to this clinic and returns approx every year for a new splint.   PERFORMANCE DEFICITS: in functional skills including sensation, tone, and UE functional use.   IMPAIRMENTS: are limiting patient from rest and sleep.   COMORBIDITIES: may have co-morbidities  that affects occupational performance. Patient will benefit from skilled OT to address above impairments and improve overall function.  MODIFICATION OR ASSISTANCE TO COMPLETE EVALUATION: No modification of tasks or assist necessary to complete an evaluation.  OT OCCUPATIONAL PROFILE AND HISTORY: Problem focused assessment: Including review of records relating to presenting problem.  CLINICAL DECISION MAKING: LOW - limited treatment options, no task modification necessary  REHAB POTENTIAL: Good  EVALUATION COMPLEXITY: Low      PLAN:  OT FREQUENCY: 1x/week  OT DURATION: 4 weeks for splint adjustments prn  PLANNED INTERVENTIONS: self care/ADL  training and splinting  RECOMMENDED OTHER SERVICES: None  CONSULTED AND AGREED WITH PLAN OF CARE: Patient and wife  PLAN FOR NEXT SESSION: splint adjustments prn   Hans Eden, OT 08/18/2022, 10:25 AM

## 2022-08-18 ENCOUNTER — Ambulatory Visit: Payer: Medicare HMO

## 2022-08-18 ENCOUNTER — Encounter: Payer: Self-pay | Admitting: Occupational Therapy

## 2022-08-18 ENCOUNTER — Ambulatory Visit: Payer: Medicare HMO | Attending: Physician Assistant | Admitting: Occupational Therapy

## 2022-08-18 DIAGNOSIS — I69851 Hemiplegia and hemiparesis following other cerebrovascular disease affecting right dominant side: Secondary | ICD-10-CM | POA: Diagnosis present

## 2022-08-18 DIAGNOSIS — M25641 Stiffness of right hand, not elsewhere classified: Secondary | ICD-10-CM | POA: Insufficient documentation

## 2022-08-18 NOTE — Patient Instructions (Signed)
Your Splint This splint should initially be fitted by a healthcare practitioner.  The healthcare practitioner is responsible for providing wearing instructions and precautions to the patient, other healthcare practitioners and care provider involved in the patient's care.  This splint was custom made for you. Please read the following instructions to learn about wearing and caring for your splint.  Precautions Should your splint cause any of the following problems, remove the splint immediately and contact your therapist/physician. Swelling Severe Pain Pressure Areas Stiffness Numbness  Do not wear your splint while operating machinery unless it has been fabricated for that purpose.  When To Wear Your Splint Where your splint according to your therapist/physician instructions. Night time However, the first 2-3 days, gradually increase wearing time during the day by 1 hour (ex: 1 hour on, 1 hour off; 2 hours on, 1 hour off; up to 4 consecutive hours) - be sure to closely monitor skin for any pressure areas, blisters, sores during off time. If problems arise, do NOT reapply splint and bring back for next appointment.   Care and Cleaning of Your Splint Keep your splint away from open flames and heat sources. Your splint will lose its shape in temperatures over 135 degrees Farenheit, ( in car windows, near radiators, ovens or in hot water).  Never make any adjustments to your splint, if the splint needs adjusting remove it and make an appointment to see your therapist. Your splint may be cleaned with rubbing alcohol.  Do not immerse in hot water over 135 degrees Farenheit.

## 2022-08-19 ENCOUNTER — Other Ambulatory Visit: Payer: Self-pay

## 2022-08-19 DIAGNOSIS — Z87891 Personal history of nicotine dependence: Secondary | ICD-10-CM

## 2022-08-27 ENCOUNTER — Encounter: Payer: Self-pay | Admitting: Occupational Therapy

## 2022-08-27 ENCOUNTER — Ambulatory Visit: Payer: Medicare HMO | Admitting: Occupational Therapy

## 2022-08-27 DIAGNOSIS — I69851 Hemiplegia and hemiparesis following other cerebrovascular disease affecting right dominant side: Secondary | ICD-10-CM

## 2022-08-27 DIAGNOSIS — M25641 Stiffness of right hand, not elsewhere classified: Secondary | ICD-10-CM

## 2022-08-27 NOTE — Therapy (Signed)
OUTPATIENT OCCUPATIONAL THERAPY ORTHO TREATMENT  Patient Name: Daniel Oconnell MRN: [id number] DOB:Mar 03, 1956, 67 y.o., male Today's Date: 08/27/2022  PCP: Delfin Gant, PA-C REFERRING PROVIDER:   Uvaldo Bristle, PA-C    END OF SESSION:  OT End of Session - 08/27/22 0846     Visit Number 2    Number of Visits 4    Date for OT Re-Evaluation 09/17/22    Authorization Type [employer]    OT Start Time 0845    OT Stop Time 0904    OT Time Calculation (min) 19 min    Activity Tolerance Patient tolerated treatment well    Behavior During Therapy [facility] for tasks assessed/performed;Impulsive             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)    CAD (coronary artery disease) 2001   CABG   COPD (chronic obstructive pulmonary disease)    CVA (cerebral vascular accident) 2001, 2013   x2   Expressive aphasia    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    Hyperlipidemia    Hypertension    Past Surgical History:  Procedure Laterality Date   APPENDECTOMY     CORONARY ARTERY BYPASS GRAFT  2001   LUMBAR SPINE SURGERY     Patient Active Problem List   Diagnosis Date Noted   COPD, severe 12/24/2021   ILD (interstitial lung disease) 12/24/2021   Centrilobular emphysema 11/11/2021   Chronic respiratory failure with hypoxia 11/11/2021   Diastolic CHF 11/11/2021   DOE (dyspnea on exertion) 08/13/2021   Community acquired bacterial pneumonia 08/13/2019   History of seizure 08/13/2019   OSA (obstructive sleep apnea) 08/13/2019   Cerebrovascular accident (CVA) 02/24/2019   Dyslipidemia 02/23/2019   Leg swelling 03/10/2017   Medication management 03/10/2017   Hyperlipidemia 03/10/2017   Adrenal abnormality    Expressive aphasia    Acute respiratory failure with hypoxia 04/17/2016   Abdominal pain, acute, right lower quadrant 04/17/2016   Nonspecific abnormal electrocardiogram (ECG) (EKG)  04/17/2016   H/O: CVA (cerebrovascular accident) 04/17/2016   COPD with acute exacerbation 04/17/2016   CAD (coronary artery disease) 04/17/2016   Essential hypertension 04/17/2016   Right lower quadrant abdominal pain    HCAP (healthcare-associated pneumonia) 04/16/2016    ONSET DATE: 07/14/22 (Referral date)   REFERRING DIAG: R hemiplegia s/p stroke w/ right hand contrature   THERAPY DIAG:  Spastic hemiplegia of right dominant side as late effect of other cerebrovascular disease  Stiffness of right hand, not elsewhere classified  Rationale for Evaluation and Treatment: Rehabilitation  SUBJECTIVE:   SUBJECTIVE STATEMENT: Pt aphasic but gave thumbs up for splint Pt accompanied by: significant other  PERTINENT HISTORY: CVA 2001 & 2013 w/ residual spastic hemiparesis, aphasic, apraxic, sensory impairment RUE, COPD  PRECAUTIONS: Other: 3L of O2  WEIGHT BEARING RESTRICTIONS: No  PAIN:  Are you having pain? No  FALLS: Has patient fallen in last 6 months? No  LIVING ENVIRONMENT: Lives with: lives with their spouse Has following equipment at home: None  PLOF: Independent with basic ADLs  PATIENT GOALS: Get a new splint   OBJECTIVE:   HAND DOMINANCE:  Lt handed since stroke (Rt handed prior to stroke)   ADLs: Overall ADLs: mod I for BADLS   UPPER EXTREMITY ROM:   RUE spasticity and no functional use since stroke in 2013  HAND FUNCTION: No functional use Rt hand except RUE as stabilizer against body  COORDINATION: No  use Rt hand  SENSATION: Significantly impaired RUE  EDEMA: Min to moderate Rt hand  COGNITION: Overall cognitive status:  aphasic but understands and makes nonverbal jokes   OBSERVATIONS: Pt returns today for new resting hand splint. Has been using previous one at night   TODAY'S TREATMENT:                                                                                                                               Pt/wife reports splint  is doing great and no pressure areas reported or noted.  Made minor adjustments to decrease amount of ring finger PIP flex and DIP hyperextension as able, however unable to correct fully d/t extent of flexion and contracture forming   PATIENT EDUCATION: Education details: splint wear and care, skin checks Person educated: Patient and Spouse Education method: Explanation, Demonstration, and Handouts Education comprehension: verbalized understanding and returned demonstration  HOME EXERCISE PROGRAM: N/A  GOALS: Goals reviewed with patient? Yes   LONG TERM GOALS: Target date: 09/17/22  Independent with splint wear and care Baseline:  Goal status: MET     ASSESSMENT:  CLINICAL IMPRESSION: Patient returns today and wife reports splint is doing well. No concerns. Made minor adjustments today  PERFORMANCE DEFICITS: in functional skills including sensation, tone, and UE functional use.   IMPAIRMENTS: are limiting patient from rest and sleep.   COMORBIDITIES: may have co-morbidities  that affects occupational performance. Patient will benefit from skilled OT to address above impairments and improve overall function.  MODIFICATION OR ASSISTANCE TO COMPLETE EVALUATION: No modification of tasks or assist necessary to complete an evaluation.  OT OCCUPATIONAL PROFILE AND HISTORY: Problem focused assessment: Including review of records relating to presenting problem.  CLINICAL DECISION MAKING: LOW - limited treatment options, no task modification necessary  REHAB POTENTIAL: Good  EVALUATION COMPLEXITY: Low      PLAN:  OT FREQUENCY: 1x/week  OT DURATION: 4 weeks for splint adjustments prn  PLANNED INTERVENTIONS: self care/ADL training and splinting  RECOMMENDED OTHER SERVICES: None  CONSULTED AND AGREED WITH PLAN OF CARE: Patient and wife  PLAN  D/C O.T.   OCCUPATIONAL THERAPY DISCHARGE SUMMARY  Visits from Start of Care: 2  Current functional level related to goals  / functional outcomes: Pt met goal   Remaining deficits: Same as evaluation   Education / Equipment: Splint wear and care   Patient agrees to discharge. Patient goals were met. Patient is being discharged due to meeting the stated rehab goals..  Pt to return in 1-2 years prn when splint begins to fall apart. May need to adjust finger portion of splint to split for ring/small fingers w/ separate straps to allow more flexion   Sheran Lawless, OT 08/27/2022, 9:06 AM

## 2022-11-06 ENCOUNTER — Encounter: Payer: Self-pay | Admitting: Internal Medicine

## 2022-11-06 ENCOUNTER — Ambulatory Visit (INDEPENDENT_AMBULATORY_CARE_PROVIDER_SITE_OTHER): Payer: Medicare HMO

## 2022-11-06 ENCOUNTER — Ambulatory Visit: Payer: Medicare HMO | Admitting: Internal Medicine

## 2022-11-06 VITALS — BP 136/78 | HR 69 | Temp 98.0°F | Ht 70.0 in | Wt 180.0 lb

## 2022-11-06 DIAGNOSIS — J441 Chronic obstructive pulmonary disease with (acute) exacerbation: Secondary | ICD-10-CM

## 2022-11-06 DIAGNOSIS — R051 Acute cough: Secondary | ICD-10-CM

## 2022-11-06 MED ORDER — ALBUTEROL SULFATE (2.5 MG/3ML) 0.083% IN NEBU
2.5000 mg | INHALATION_SOLUTION | Freq: Once | RESPIRATORY_TRACT | Status: AC
  Administered 2022-11-06: 2.5 mg via RESPIRATORY_TRACT

## 2022-11-06 MED ORDER — PREDNISONE 10 MG PO TABS: 40.0000 mg | ORAL_TABLET | Freq: Every day | ORAL | 0 refills | Status: DC

## 2022-11-06 MED ORDER — ALBUTEROL SULFATE (2.5 MG/3ML) 0.083% IN NEBU: 2.5000 mg | INHALATION_SOLUTION | RESPIRATORY_TRACT | 0 refills | Status: DC | PRN

## 2022-11-06 NOTE — Patient Instructions (Addendum)
Follow up with Dr. Francine Graven in about 3-4 months.   Take prednisone 40 mg x 5 days for your breathing and cough.  Chest xray did not show pneumonia.   Recommend albuterol breathing treatments through a nebulizer machine. Every 4 hours as needed. Would take these more frequently over the next week, then can back off.   Continue breztri 2 puffs twice a day, gargle after use.

## 2022-11-06 NOTE — Progress Notes (Signed)
Daniel Oconnell    [id number]    12/25/1955  Primary Care Physician:Kurth-Bowen, Cornelia, PA-C Date of Appointment: 11/06/2022 Established Patient Visit  Chief complaint:   Chief Complaint  Patient presents with   Acute Visit    Productive cough x 2 days      HPI: Daniel Oconnell is a 67 y.o. man with COPD, CAD s/P CABG and CVA.   Interval Updates: He is in breztri 2 puffs twice a day. On oxygen 2-4LNC  He is disabled with right sided hemiplegia due to stroke. Lives in assisted living.   Last night started having worsening cough, congestion last night.  Denies fevers, no worsening shortness of breath. Appetite is ok.   Takes albuterol and this is helping a lot. He absolutely has to have it midday    I have reviewed the patient's family social and past medical history and updated as appropriate.   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    Hyperlipidemia    Hypertension     Past Surgical History:  Procedure Laterality Date   APPENDECTOMY     CORONARY ARTERY BYPASS GRAFT  2001   LUMBAR SPINE SURGERY      Family History  Problem Relation Age of Onset   CVA Mother 25   Aneurysm Father     Social History   Occupational History   Occupation: Disabled    Comment: Licensed conveyancer  Tobacco Use   Smoking status: Former    Packs/day: 1.00    Years: 28.00    Additional pack years: 0.00    Total pack years: 28.00    Types: Cigarettes    Quit date: 2013    Years since quitting: 11.4   Smokeless tobacco: Never  Substance and Sexual Activity   Alcohol use: No    Comment: h/o heavy ETOH use   Drug use: No    Types: Cocaine, Marijuana    Comment: h/o drug use    Sexual activity: Not Currently     Physical Exam: Blood pressure 136/78, pulse 69, temperature 98 F (36.7 C), temperature source Oral, height 5\' 10"  (1.778 m), weight 180 lb (81.6 kg), SpO2 94 %.  Gen:      No acute distress, chronically ill in wheel chair, on nasal cannula Lungs:    diminished, no wheeze CV:         Regular rate and rhythm; no murmurs, rubs, or gallops.  No pedal edema   Data Reviewed: Imaging: I have personally reviewed the CT Chest April 2024 with chronic bronciectasis changes, pleural thickening. Obtained a chest xray today which shows no change from previous   PFTs:     Latest Ref Rng & Units 12/24/2021   10:55 AM  PFT Results  FVC-Pre L 2.84   FVC-Predicted Pre % 70   FVC-Post L 2.77   FVC-Predicted Post % 69   Pre FEV1/FVC % % 62   Post FEV1/FCV % % 62   FEV1-Pre L 1.75   FEV1-Predicted Pre % 59   FEV1-Post L 1.72   DLCO uncorrected ml/min/mmHg 8.89   DLCO UNC% % 37   DLCO corrected ml/min/mmHg  8.89   DLCO COR %Predicted % 37   DLVA Predicted % 51   TLC L 4.27   TLC % Predicted % 66   RV % Predicted % 59    I have personally reviewed the patient's PFTs and they show moderately severe COPD   Labs: Lab Results  Component Value Date   NA 140 05/04/2022   K 3.1 (L) 05/04/2022   CO2 30 (H) 05/04/2022   GLUCOSE 80 05/04/2022   BUN 11 05/04/2022   CREATININE 1.06 05/04/2022   CALCIUM 8.5 (L) 05/04/2022   EGFR 76 05/04/2022   GFRNONAA >60 08/15/2019   Lab Results  Component Value Date   WBC 10.8 (H) 01/30/2022   HGB 14.3 01/30/2022   HCT 41.8 01/30/2022   MCV 83.9 01/30/2022   PLT 220.0 01/30/2022    Immunization status: Immunization History  Administered Date(s) Administered   Influenza Inj Mdck Quad Pf 02/01/2018   Influenza, High Dose Seasonal PF 02/11/2019   Influenza-Unspecified 04/14/2011   Tdap 01/08/2011    External Records Personally Reviewed: pulmonyar  Assessment:  COPD with acute exacerbation Chronic respiratory  failure  Plan/Recommendations:  Chest xray obtained today shows no focal pneumonia.  Prednisone 40 mg x 5 days.  Neb in office today.  Will start neb treatments at home as well.  Continue breztri.   Return to Care: Return in about 3 months (around 02/06/2023) for with Dr. Francine Graven.   Durel Salts, MD Pulmonary and Critical Care Medicine [facility] Office:[phone]

## 2023-03-17 ENCOUNTER — Encounter: Payer: Self-pay | Admitting: Podiatry

## 2023-03-17 ENCOUNTER — Ambulatory Visit (INDEPENDENT_AMBULATORY_CARE_PROVIDER_SITE_OTHER): Payer: Medicare HMO | Admitting: Podiatry

## 2023-03-17 DIAGNOSIS — M79675 Pain in left toe(s): Secondary | ICD-10-CM

## 2023-03-17 DIAGNOSIS — M79674 Pain in right toe(s): Secondary | ICD-10-CM

## 2023-03-17 DIAGNOSIS — B351 Tinea unguium: Secondary | ICD-10-CM | POA: Diagnosis not present

## 2023-03-17 NOTE — Progress Notes (Signed)
This patient presents to my office for at risk foot care.  This patient requires this care by a professional since this patient will be at risk due to having CVA and coagulation defect due to plavix.   This patient is unable to cut nails himself since the patient cannot reach his nails.These nails are painful walking and wearing shoes.  This patient presents for at risk foot care today.  General Appearance  Alert, conversant and in no acute stress.  Vascular  Dorsalis pedis and posterior tibial  pulses are  weakly palpable  bilaterally.  Capillary return is within normal limits  bilaterally. Temperature is within normal limits  bilaterally.  Neurologic  Senn-Weinstein monofilament wire test within normal limits  bilaterally. Muscle power within normal limits bilaterally.  Nails Thick disfigured discolored nails with subungual debris  hallux toenails s bilaterally. No evidence of bacterial infection or drainage bilaterally.  Orthopedic  No limitations of motion  feet .  No crepitus or effusions noted.  No bony pathology or digital deformities noted.  Skin  normotropic skin with no porokeratosis noted bilaterally.  No signs of infections or ulcers noted.     Onychomycosis  Pain in right toes  Pain in left toes  Consent was obtained for treatment procedures.   Mechanical debridement of nails 1-5  bilaterally performed with a nail nipper.  Filed with dremel without incident.    Return office visit   3 months                   Told patient to return for periodic foot care and evaluation due to potential at risk complications.   Helane Gunther DPM

## 2023-06-27 IMAGING — CT CT CHEST LUNG CANCER SCREENING LOW DOSE W/O CM
1 series · 10 of 10 positions shown, 13 images · non-contrast
Comparison: Chest CTA 03/16/2016.

CLINICAL DATA: 66-year-old male former smoker (quit in 9248) with
76 pack-year history of smoking. Lung cancer screening examination.



[ct lung segmentation data · axial · 0.81mm/px · z∈[-224,-224]mm · 10 of 249 frames shown]
[frame 1/249  mediastinal]
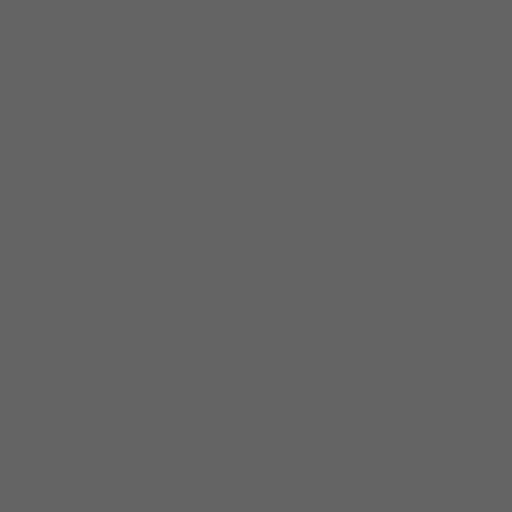
[frame 1/249  lung]
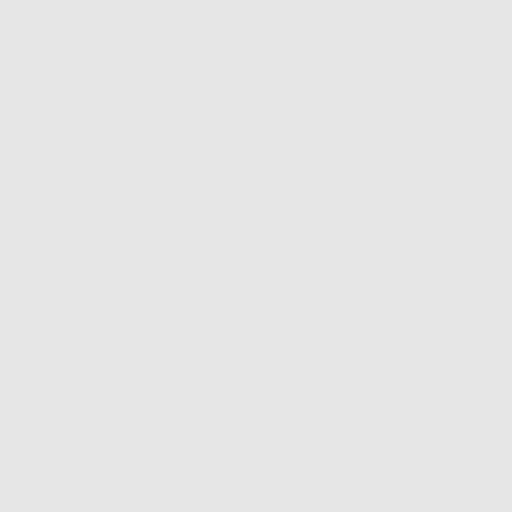
[frame 28/249  lung]
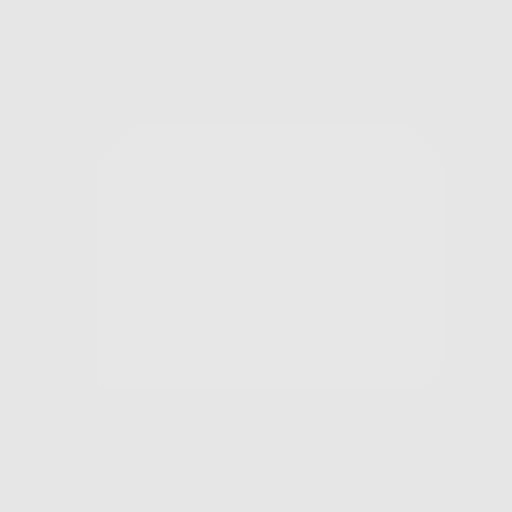
[frame 56/249  lung]
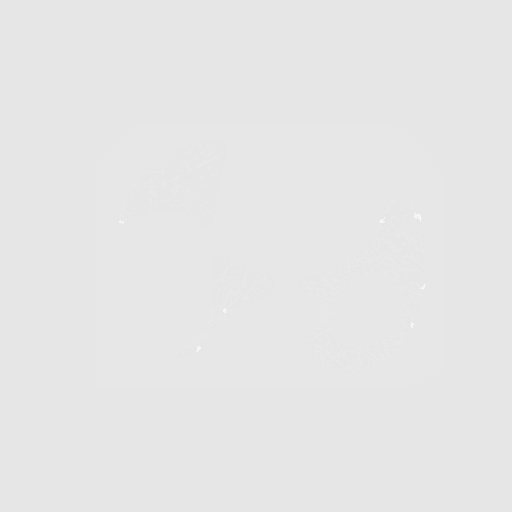
[frame 83/249  lung]
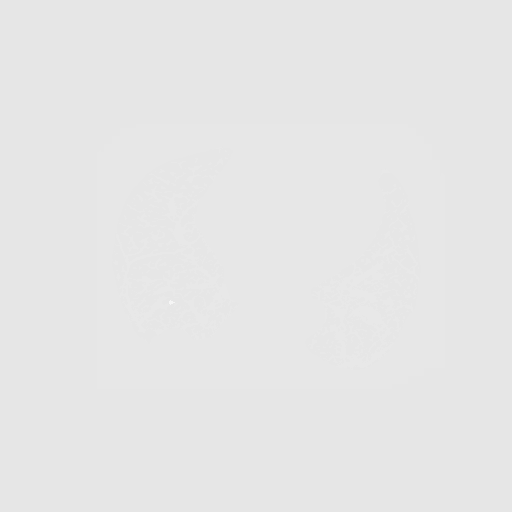
[frame 111/249  mediastinal]
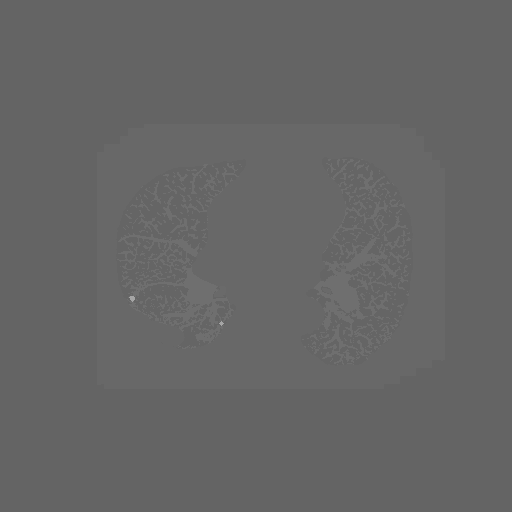
[frame 111/249  lung]
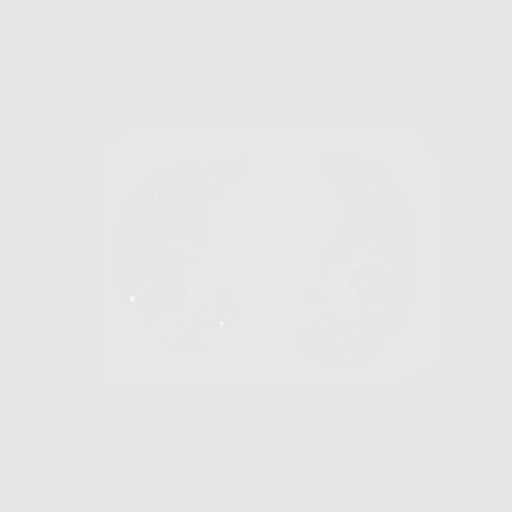
[frame 138/249  lung]
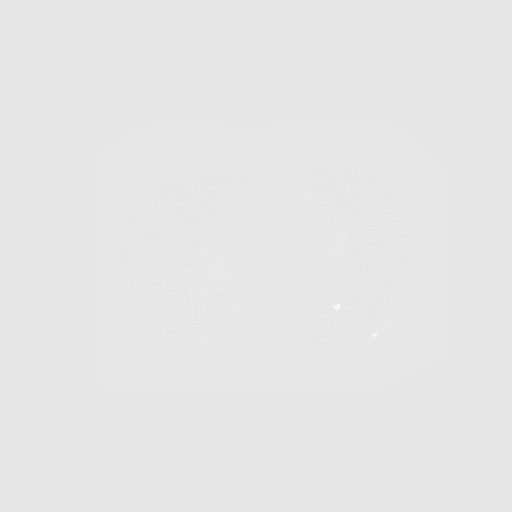
[frame 166/249  lung]
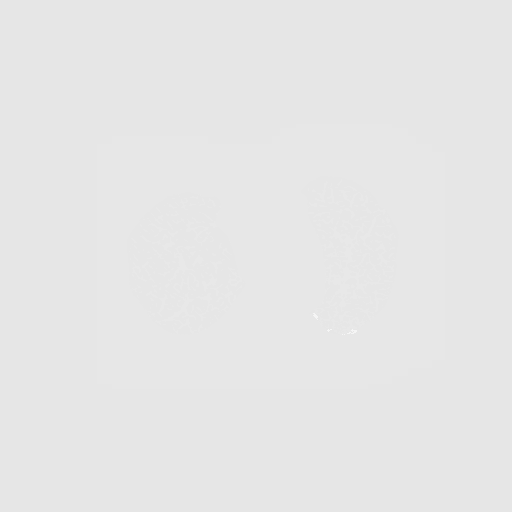
[frame 193/249  lung]
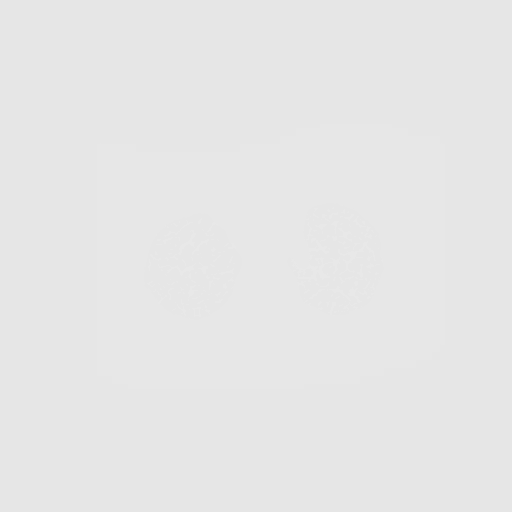
[frame 221/249  mediastinal]
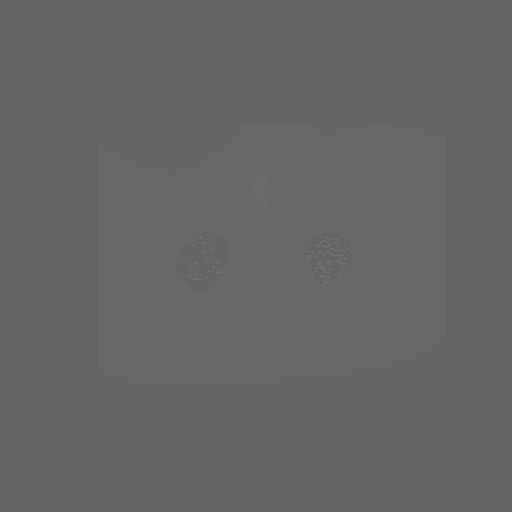
[frame 221/249  lung]
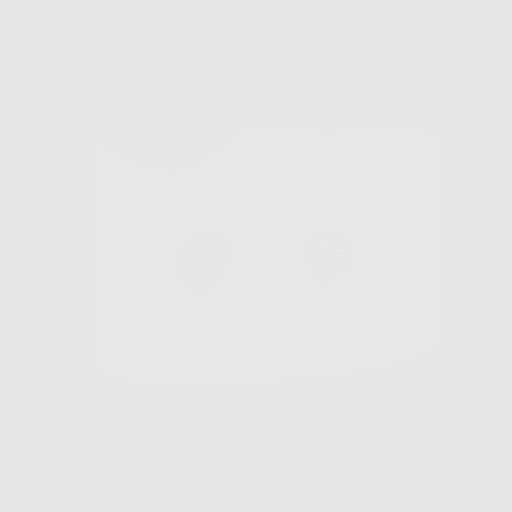
[frame 249/249  lung]
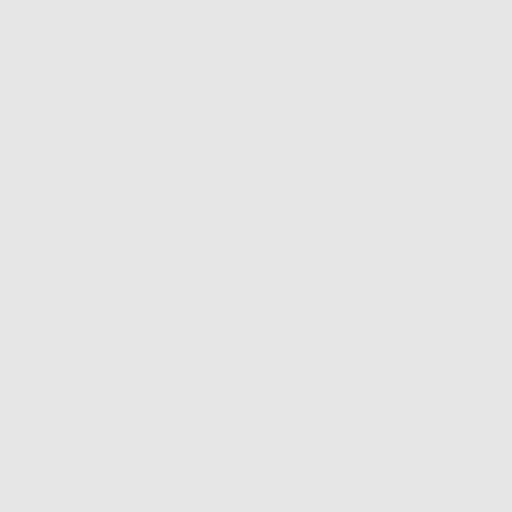

[10 of 10 positions shown; findings below may reference images not displayed]

FINDINGS: Cardiovascular: Heart size is normal. There is no significant
pericardial fluid, thickening or pericardial calcification. There is
aortic atherosclerosis, as well as atherosclerosis of the great
vessels of the mediastinum and the coronary arteries, including
calcified atherosclerotic plaque in the left main, left anterior
descending, left circumflex and right coronary arteries. Status post
median sternotomy for CABG including [REDACTED] to the LAD.

Mediastinum/Nodes: No pathologically enlarged mediastinal or hilar
lymph nodes. Please note that accurate exclusion of hilar adenopathy
is limited on noncontrast CT scans. Esophagus is unremarkable in
appearance. No axillary lymphadenopathy.

Lungs/Pleura: No suspicious appearing pulmonary nodules or masses
are noted. No acute consolidative airspace disease. Small right
pleural effusion lying dependently with pleural thickening and some
pleural calcifications. Trace amount of left-sided pleural
thickening as well, also with a small amount of pleural
calcifications in the left hemithorax. Mild diffuse bronchial wall
thickening with very mild centrilobular and paraseptal emphysema.

Upper Abdomen: Aortic atherosclerosis.

Musculoskeletal: Median sternotomy wires. There are no aggressive
appearing lytic or blastic lesions noted in the visualized portions
of the skeleton.
IMPRESSION: 1. Lung-RADS 1S, negative. Continue annual screening with low-dose
chest CT without contrast in 12 months.
2. The "S" modifier above refers to potentially clinically
significant non lung cancer related findings. Specifically, there is
evidence of asbestos related pleural disease, as above.
3. Aortic atherosclerosis, in addition to left main and three-vessel
coronary artery disease. Status post median sternotomy for CABG
including [REDACTED] to the LAD.
4. Mild diffuse bronchial wall thickening with very mild
centrilobular and paraseptal emphysema; imaging findings suggestive
of underlying COPD.

Aortic Atherosclerosis (MJSF3-S2R.R) and Emphysema (MJSF3-PTN.E).

## 2023-07-02 ENCOUNTER — Telehealth: Payer: Self-pay | Admitting: Pulmonary Disease

## 2023-07-02 NOTE — Telephone Encounter (Signed)
Spoke with the pt's spouse  He was dx with flu 2 wks ago  Has taken Tamiflu  He is having increased DOE  Denies any fever, aches, cough  She requests appt for acute visit   I have him scheduled with CY 07/05/23- soonest available  Advised to seek emergent care sooner if needed

## 2023-07-03 NOTE — Progress Notes (Unsigned)
Daniel Oconnell    [id number]    24-Dec-1955  Primary Care Physician:Kurth-Bowen, Cornelia, PA-C Date of Appointment: 07/03/2023 Established Patient Visit  Chief complaint:   No chief complaint on file.    HPI: [name] is a 68 y.o. man with COPD, CAD s/P CABG and CVA.   Interval Updates: He is in breztri 2 puffs twice a day. On oxygen 2-4LNC  He is disabled with right sided hemiplegia due to stroke. Lives in assisted living.   Last night started having worsening cough, congestion last night.  Denies fevers, no worsening shortness of breath. Appetite is ok.   Takes albuterol and this is helping a lot. He absolutely has to have it midday    I have reviewed the patient's family social and past medical history and updated as appropriate.   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    Hyperlipidemia    Hypertension     Past Surgical History:  Procedure Laterality Date   APPENDECTOMY     CORONARY ARTERY BYPASS GRAFT  2001   LUMBAR SPINE SURGERY      Family History  Problem Relation Age of Onset   CVA Mother 55   Aneurysm Father     Social History   Occupational History   Occupation: Disabled    Comment: Licensed conveyancer  Tobacco Use   Smoking status: Former    Current packs/day: 0.00    Average packs/day: 1 pack/day for 28.0 years (28.0 ttl pk-yrs)    Types: Cigarettes    Start date: 21    Quit date: 2013    Years since quitting: 12.1   Smokeless tobacco: Never  Substance and Sexual Activity   Alcohol use: No    Comment: h/o heavy ETOH use   Drug use: No    Types: Cocaine, Marijuana    Comment: h/o drug use   Sexual activity: Not Currently      Physical Exam: There were no vitals taken for this visit.  Gen:      No acute distress, chronically ill in wheel chair, on nasal cannula Lungs:    diminished, no wheeze CV:         Regular rate and rhythm; no murmurs, rubs, or gallops.  No pedal edema   Data Reviewed: Imaging: I have personally reviewed the CT Chest April 2024 with chronic bronciectasis changes, pleural thickening. Obtained a chest xray today which shows no change from previous   PFTs:     Latest Ref Rng & Units 12/24/2021   10:55 AM  PFT Results  FVC-Pre L 2.84   FVC-Predicted Pre % 70   FVC-Post L 2.77   FVC-Predicted Post % 69   Pre FEV1/FVC % % 62   Post FEV1/FCV % % 62   FEV1-Pre L 1.75   FEV1-Predicted Pre % 59   FEV1-Post L 1.72   DLCO uncorrected ml/min/mmHg 8.89   DLCO UNC% % 37   DLCO corrected ml/min/mmHg 8.89   DLCO COR %Predicted % 37   DLVA Predicted % 51   TLC L 4.27   TLC % Predicted % 66   RV % Predicted %  61    I have personally reviewed the patient's PFTs and they show moderately severe COPD   Labs: Lab Results  Component Value Date   NA 140 05/04/2022   K 3.1 (L) 05/04/2022   CO2 30 (H) 05/04/2022   GLUCOSE 80 05/04/2022   BUN 11 05/04/2022   CREATININE 1.06 05/04/2022   CALCIUM 8.5 (L) 05/04/2022   EGFR 76 05/04/2022   GFRNONAA >60 08/15/2019   Lab Results  Component Value Date   WBC 10.8 (H) 01/30/2022   HGB 14.3 01/30/2022   HCT 41.8 01/30/2022   MCV 83.9 01/30/2022   PLT 220.0 01/30/2022    Immunization status: Immunization History  Administered Date(s) Administered   Influenza Inj Mdck Quad Pf 02/01/2018   Influenza, High Dose Seasonal PF 02/11/2019   Influenza-Unspecified 04/14/2011   Tdap 01/08/2011    External Records Personally Reviewed: pulmonyar  Assessment:  COPD with acute exacerbation Chronic respiratory failure  Plan/Recommendations:  Chest xray obtained today shows no focal pneumonia.  Prednisone 40 mg x 5 days.  Neb in office  today.  Will start neb treatments at home as well.  Continue breztri.   Return to Care: No follow-ups on file.   Durel Salts, MD Pulmonary and Critical Care Medicine [facility] Office:[phone]

## 2023-07-03 NOTE — Progress Notes (Unsigned)
07/05/23- 68yoM former smoker for acute visit Last saw Dr Celine Mans in June, 2024 for COPD exacerbation. Pending f/u with Dr Francine Graven this month. Wife here, Patient has expressive aphasia and she speaks for him. Medical problems include COPD, Chronic Respiratory Failure/ Hypoxia, CVA/R hemiplegia/ Expressive Aphasia, CAD, dCHF, HTN, OSA, ILD, Lives in assisted living. O2 3-5L -Albuterol hfa, Breztri, Dx'd flu 2 weeks ago> Tamiflu. Worse dyspnea. Wife called here and Dr Francine Graven scheduled visit with me as first available. Arrival O2 sat 93% on O2, HR 119. Wife had not been checking O2 sat until current illness. She notes desaturation into 80's if O2 off with ADLs, but it isn't clear how much that is different. He has not had edema, palpitation, cough or wheeze. She is interested in getting him a portable concentrator capable of continuous flow. I suggested she talk with [employer] and with [employer] to see if they can provide what she has in mind. We can prescribe if she finds what she wants. CXR 11/06/22- IMPRESSION: 1. The radiographic appearance of the chest is unchanged, with findings suggestive of chronic bronchitis, as above. 2. Chronic pleuroparenchymal scarring in the base of the right hemithorax, similar to the prior study. 3. Aortic atherosclerosis. Discussed the use of AI scribe software for clinical note transcription with the patient, who gave verbal consent to proceed.  History of Present Illness   The patient, with a history of COPD, presents with increased shortness of breath following a recent bout of the flu. The caregiver reports that the patient's dyspnea has worsened with exertion, such as walking or showering, and that the patient's oxygen saturation levels have dropped to 84% on room air during these activities. Wife had not been checking this at baseline. The patient does not use supplemental oxygen during these activities, but does use an oxygen concentrator while at rest. The patient's  dyspnea is not associated with coughing or chest pain. The patient also has a history of stroke, which limits his mobility and speech and may contribute to his exertional dyspnea. The patient's current medications include albuterol and Bevespi for COPD management.     Prior to Admission medications   Medication Sig Start Date End Date Taking? Authorizing Provider  acetaminophen (TYLENOL) 500 MG tablet Take 500 mg by mouth every 4 (four) hours as needed for mild pain, moderate pain, fever or headache.    [provider]  albuterol (PROVENTIL) (2.5 MG/3ML) 0.083% nebulizer solution Take 3 mLs (2.5 mg total) by nebulization every 4 (four) hours as needed for wheezing or shortness of breath. 11/06/22   Charlott Holler, MD  albuterol (VENTOLIN HFA) 108 (90 Base) MCG/ACT inhaler Inhale 1-2 puffs into the lungs every 6 (six) hours as needed for wheezing or shortness of breath. 03/26/22   Martina Sinner, MD  alum & mag hydroxide-simeth (MAALOX/MYLANTA) 200-200-20 MG/5ML suspension Take 30 mLs by mouth as needed for indigestion or heartburn.    [provider]  atorvastatin (LIPITOR) 80 MG tablet Take 80 mg by mouth at bedtime.     [provider]  baclofen (LIORESAL) 20 MG tablet Take 20 mg by mouth 3 (three) times daily.    [provider]  benzonatate (TESSALON) 100 MG capsule Take 1 capsule (100 mg total) by mouth every 8 (eight) hours. Patient not taking: Reported on 11/06/2022 04/16/22   Carlisle Beers, FNP  BREZTRI AEROSPHERE 160-9-4.8 MCG/ACT AERO Inhale into the lungs. 01/12/22   [provider]  clopidogrel (PLAVIX) 75 MG tablet Take  75 mg by mouth daily with breakfast.     [provider]  dicyclomine (BENTYL) 10 MG capsule Take 10 mg by mouth daily.    [provider]  docusate sodium (COLACE) 100 MG capsule Take 100 mg by mouth 2 (two) times daily.    [provider]  guaifenesin (ROBITUSSIN) 100 MG/5ML syrup Take 200  mg by mouth every 6 (six) hours as needed for cough.    [provider]  hydrALAZINE (APRESOLINE) 100 MG tablet Take 1 tablet (100 mg total) by mouth 3 (three) times daily. 08/14/21   Rollene Rotunda, MD  levETIRAcetam (KEPPRA) 750 MG tablet Take 750 mg by mouth 2 (two) times daily.    [provider]  levothyroxine (SYNTHROID, LEVOTHROID) 25 MCG tablet Take 1 tablet by mouth daily. 03/25/18   [provider]  loperamide (IMODIUM) 2 MG capsule Take by mouth as needed for diarrhea or loose stools.    [provider]  loratadine (CLARITIN) 10 MG tablet Take 1 tablet (10 mg total) by mouth daily. 04/16/22   Carlisle Beers, FNP  magnesium hydroxide (MILK OF MAGNESIA) 400 MG/5ML suspension Take 30 mLs by mouth at bedtime as needed for mild constipation.    [provider]  melatonin 3 MG TABS tablet Take 3 mg by mouth at bedtime.    [provider]  mometasone (NASONEX) 50 MCG/ACT nasal spray  08/04/21   [provider]  neomycin-bacitracin-polymyxin (NEOSPORIN) ointment Apply 1 application. topically as needed for wound care.    [provider]  Polyethyl Glycol-Propyl Glycol (SYSTANE ULTRA) 0.4-0.3 % SOLN Apply 1 drop to eye 3 (three) times daily.    [provider]  polyethylene glycol powder (GLYCOLAX/MIRALAX) powder Take 17 g by mouth 2 (two) times daily. Use for constipation, as prescribed, until daily soft stools  OTC 04/15/16   Antony Madura, PA-C  potassium chloride SA (KLOR-CON M) 20 MEQ tablet Take 2 tablets (40 mEq total) by mouth daily. 05/08/22   Joylene Grapes, NP  predniSONE (DELTASONE) 10 MG tablet Take 4 tablets (40 mg total) by mouth daily with breakfast. 11/06/22   Charlott Holler, MD  sertraline (ZOLOFT) 100 MG tablet Take 100 mg by mouth daily with breakfast.     [provider]  tamsulosin (FLOMAX) 0.4 MG CAPS capsule Take 1 capsule (0.4 mg total) by mouth daily. 04/22/16   Johnson, Clanford  L, MD  Torsemide 60 MG TABS Take 40 mg in the morning and 20 mg in the afternoon. 03/18/22   Joylene Grapes, NP  traZODone (DESYREL) 100 MG tablet Take 100 mg by mouth at bedtime. 06/22/19   [provider]  vitamin B-12 (CYANOCOBALAMIN) 1000 MCG tablet Take 1,000 mcg by mouth daily.    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    Hyperlipidemia    Hypertension    Past Surgical History:  Procedure Laterality Date   APPENDECTOMY     CORONARY ARTERY BYPASS GRAFT  2001   LUMBAR SPINE SURGERY     Family History  Problem Relation Age of Onset   CVA Mother 63   Aneurysm Father  Social History   Socioeconomic History   Marital status: Divorced    Spouse name: Not on file   Number of children: 2   Years of education: Not on file   Highest education level: Not on file  Occupational History   Occupation: Disabled    Comment: Licensed conveyancer  Tobacco Use   Smoking status: Former    Current packs/day: 0.00    Average packs/day: 1 pack/day for 28.0 years (28.0 ttl pk-yrs)    Types: Cigarettes    Start date: 21    Quit date: 2013    Years since quitting: 12.1   Smokeless tobacco: Never  Substance and Sexual Activity   Alcohol use: No    Comment: h/o heavy ETOH use   Drug use: No    Types: Cocaine, Marijuana    Comment: h/o drug use   Sexual activity: Not Currently  Other Topics Concern   Not on file  Social History Narrative   Not on file   Social Drivers of Health   Financial Resource Strain: Not on file  Food Insecurity: No Food Insecurity (12/09/2020)   Received from [facility], [facility]   Hunger Vital Sign    Worried About  Running Out of Food in the Last Year: Never true    Ran Out of Food in the Last Year: Never true  Transportation Needs: Not on file  Physical Activity: Not on file  Stress: Not on file  Social Connections: Unknown (09/17/2021)   Received from [facility], [facility]   Social Network    Social Network: Not on file  Intimate Partner Violence: Unknown (08/18/2021)   Received from [facility], [facility]   HITS    Physically Hurt: Not on file    Insult or Talk Down To: Not on file    Threaten Physical Harm: Not on file    Scream or Curse: Not on file   ROS-see HPI   + = positive Constitutional:    weight loss, night sweats, fevers, chills, fatigue, lassitude. HEENT:    headaches, difficulty swallowing, tooth/dental problems, sore throat,       sneezing, itching, ear ache, nasal congestion, post nasal drip, snoring CV:    chest pain, orthopnea, PND, swelling in lower extremities, anasarca,                                   dizziness, palpitations Resp:   +shortness of breath with exertion or at rest.                productive cough,   non-productive cough, coughing up of blood.              change in color of mucus.  wheezing.   Skin:    rash or lesions. GI:  No-   heartburn, indigestion, abdominal pain, nausea, vomiting, diarrhea,                 change in bowel habits, loss of appetite GU: dysuria, change in color of urine, no urgency or frequency.   flank pain. MS:   joint pain, stiffness, decreased range of motion, back pain. Neuro-     +HPI Psych:  change in mood or affect.  depression or anxiety.   memory loss.  OBJ- Physical Exam- +laughing and interactive General- Alert, Oriented, Affect-appropriate, Distress- none acute, POC 5L pulse/ arrival sat 93% Skin- rash-none, lesions- none, excoriation- none  Lymphadenopathy- none Head- atraumatic            Eyes- Gross vision intact, PERRLA, conjunctivae and secretions clear            Ears- Hearing, canals-normal             Nose- Clear, no-Septal dev, mucus, polyps, erosion, perforation             Throat- Mallampati II , mucosa clear , drainage- none, tonsils- atrophic Neck- flexible , trachea midline, no stridor , thyroid nl, carotid no bruit Chest - symmetrical excursion , unlabored           Heart/CV- RRR , no murmur , no gallop  , no rub, nl s1 s2                           - JVD- none , edema- none, stasis changes- none, varices- none           Lung- clear to P&A/ unlabored, wheeze- none, cough- none , dullness-none, rub- none           Chest wall-  Abd-  Br/ Gen/ Rectal- Not done, not indicated Extrem- cyanosis- none, clubbing, none, atrophy- none, strength- nl Neuro- +R hemiplegia but ambulatory, +expressive aphasia but understands speech.  Assessment and Plan    COPD Exacerbation Increased shortness of breath with exertion since recent flu illness. No cough, chest pain, or lower extremity edema. Last exacerbation treated with prednisone. Oxygen saturation dropped to 84% on room air with exertion on room air. -Order chest x-ray and blood work. -Continue current inhaler regimen (Breztri and Albuterol as needed). -Consider increasing oxygen supplementation as needed to maintain saturation above 88%. -Consider obtaining a nebulizer if wheezing develops. -Follow-up with primary care provider (Dr. Irena Cords) in 2 weeks.     Chronic Respiratory Failure with Hypoxia - continue O2 -monitor O2 saturation more closely   CVA with residual partial R hemipegia

## 2023-07-05 ENCOUNTER — Ambulatory Visit: Payer: Medicare HMO

## 2023-07-05 ENCOUNTER — Encounter: Payer: Self-pay | Admitting: Internal Medicine

## 2023-07-05 ENCOUNTER — Ambulatory Visit: Payer: Medicare HMO | Admitting: Internal Medicine

## 2023-07-05 VITALS — BP 126/68 | HR 117 | Temp 97.8°F | Ht 70.0 in | Wt 172.4 lb

## 2023-07-05 DIAGNOSIS — J441 Chronic obstructive pulmonary disease with (acute) exacerbation: Secondary | ICD-10-CM | POA: Diagnosis not present

## 2023-07-05 MED ORDER — PREDNISONE 10 MG PO TABS: ORAL_TABLET | ORAL | 0 refills | Status: DC

## 2023-07-05 NOTE — Patient Instructions (Addendum)
Order- CXR--    dx COPD exacerbation  Order- lab- BMET, CBC w diff, B Nat Peptide     dx COPD exacerbation  Script sent for prednisone taper   Keep appointment with Dr Francine Graven on 2/ 27

## 2023-07-15 ENCOUNTER — Encounter: Payer: Self-pay | Admitting: Pulmonary Disease

## 2023-07-15 ENCOUNTER — Ambulatory Visit: Payer: Medicare HMO | Admitting: Pulmonary Disease

## 2023-07-15 VITALS — BP 124/74 | HR 94 | Ht 70.0 in | Wt 170.4 lb

## 2023-07-15 DIAGNOSIS — J9611 Chronic respiratory failure with hypoxia: Secondary | ICD-10-CM | POA: Diagnosis not present

## 2023-07-15 DIAGNOSIS — J439 Emphysema, unspecified: Secondary | ICD-10-CM

## 2023-07-15 NOTE — Patient Instructions (Addendum)
 Continue breztri 2 puffs twice daily - rinse mouth out after each use  Continue albuterol inhaler every afternoon and every 4-6 hours as needed  Continue supplemental oxygen  Continue physical activity as you are  Follow up in 6 months

## 2023-07-15 NOTE — Progress Notes (Signed)
 Synopsis: Referred in March 2023 for COPD by Uvaldo Bristle, PA  Subjective:   PATIENT ID: Daniel Oconnell GENDER: male DOB: 04-20-1956, MRN: [id number]   HPI  Chief Complaint  Patient presents with   Follow-up    Pt states no concerns breathing a lot better while walking Rx helped alot   Daniel Oconnell is a 68 year old male, former smoker with hypertension, coronary artery disease s/p CABG and CVA who returns to pulmonary clinic for COPD.   He was seen 11/06/22 by Dr. Celine Mans for COPD exacerbation and 07/05/23 by Dr. Maple Hudson for COPD exacerbation.  He was previously seen and prescribed prednisone, which significantly improved his symptoms, particularly his ability to walk and exert himself without difficulty.  His current medication regimen includes Breztri, two puffs twice a day, and an albuterol inhaler, which he uses once daily in the afternoon. The albuterol is used regularly now, as it helps with shortness of breath during [facility] or in [facility]. He also takes extra potassium on Monday, Wednesday, and Friday.  He is a mouth breather, which affects his oxygen intake. He has not yet followed up on obtaining a portable oxygen concentrator with continuous flow, as he was informed that most portable systems are pulse-based and continuous flow systems have limited battery life.  He has previously benefited from pulmonary rehabilitation, which provided valuable information and techniques for managing his COPD. He sometimes forgets to breathe properly during exertion, likely due to a past stroke, and is reminded to slow his breathing and focus on exhaling fully to manage his symptoms.  He was unable to complete lab work during his last visit due to dehydration, which made it difficult to find a vessel for blood draw. He has not yet returned for these labs.  OV 07/01/2022 He has done well since last visit. He continues on Breztri 2 puffs twice daily and PRN  albuterol inhaler. He continues to exercise [place] [facility]. No acute issues at this time. He remains on oxygen 2-4L.  OV 01/23/22 He was seen by Rhunette Croft, NP 6/27 and 8/9. He was started on breztri at last visit. He has been doing well with pulmonary rehab. He feels his shortness of breath is improved at this time. He remains on 2-4L of supplemental oxygen.  CT Chest LCS 08/15/21 shows mild paraseptal and centrilobular emphysema. There is small right pleural effusion with pleural thickening and some pleural calcifications. Trace left sided pleural thickening with small amount of calcifications.  Hypersensitivity pneumonitis panel and ANCA screen were negative.    Initial OV 07/18/21 He is accompanied by his ex-wife, Darl Pikes. He has right sided deficit and does have degree of aphasia given his history of CVA. He reports being diagnosed with COPD many years ago and has been on supplemental oxygen since a hospitalization for pneumonia in 2017. He is currently using albuterol 2 puffs three times daily. He reports increasing dyspnea with his PT/OT sessions at [facility]. He lives at a nursing home. He is using 2.5L at rest and up to 4L with physical activity. He denies any cough or wheezing. He quit smoking in 2013 after his stroke. 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   He has over a 20 pack year smoking history. He is disabled due to the stroke.   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    Hyperlipidemia    Hypertension      Family History  Problem Relation Age of Onset   CVA Mother 82   Aneurysm Father       Social History   Socioeconomic History   Marital status: Divorced    Spouse name: Not on file   Number of children: 2   Years of education: Not on file   Highest education level: Not on file  Occupational History   Occupation: Disabled    Comment: Licensed conveyancer  Tobacco Use   Smoking status: Former    Current packs/day: 0.00    Average packs/day: 1 pack/day for 28.0 years (28.0 ttl pk-yrs)    Types: Cigarettes    Start date: 71    Quit date: 2013    Years since quitting: 12.1   Smokeless tobacco: Never  Substance and Sexual Activity   Alcohol use: No    Comment: h/o heavy ETOH use   Drug use: No    Types: Cocaine, Marijuana    Comment: h/o drug use   Sexual activity: Not Currently  Other Topics Concern   Not on file  Social History Narrative   Not on file   Social Drivers of Health   Financial Resource Strain: Not on file  Food Insecurity: No Food Insecurity (12/09/2020)   Received from [facility], [facility]   Hunger Vital Sign    Worried About Running Out of Food in the Last Year: Never true    Ran Out of Food in the Last Year: Never true  Transportation Needs: Not on file  Physical Activity: Not on file  Stress: Not on file  Social Connections: Unknown (09/17/2021)   Received from [facility], [facility]   Social Network    Social Network: Not on file  Intimate Partner Violence: Unknown (08/18/2021)   Received from [employer], [facility]   HITS    Physically Hurt: Not on file    Insult or Talk Down To: Not on file    Threaten Physical Harm: Not on file    Scream or Curse: Not on file     Allergies  Allergen Reactions   Clonidine Diarrhea    Not listed on MAR   Sulfa Antibiotics Other (See Comments)    Unknown; not listed on MAR     Outpatient Medications Prior to Visit  Medication Sig Dispense Refill   acetaminophen (TYLENOL) 500 MG tablet Take 500 mg by mouth every 4 (four) hours as needed for mild pain, moderate pain,  fever or headache.     albuterol (PROVENTIL) (2.5 MG/3ML) 0.083% nebulizer solution Take 3 mLs (2.5 mg total) by nebulization every 4 (four) hours as needed for wheezing or shortness of breath. 75 mL 0   albuterol (VENTOLIN HFA) 108 (90 Base) MCG/ACT inhaler Inhale 1-2 puffs into the lungs every 6 (six) hours as needed for wheezing or shortness of breath. 8 g 6   alum & mag hydroxide-simeth (MAALOX/MYLANTA) 200-200-20 MG/5ML suspension Take 30 mLs by mouth as needed for indigestion or heartburn.     atorvastatin (LIPITOR) 80 MG tablet Take 80 mg by mouth at bedtime.      baclofen (LIORESAL) 20 MG  tablet Take 20 mg by mouth 3 (three) times daily.     benzonatate (TESSALON) 100 MG capsule Take 1 capsule (100 mg total) by mouth every 8 (eight) hours. 21 capsule 0   BREZTRI AEROSPHERE 160-9-4.8 MCG/ACT AERO Inhale into the lungs.     clopidogrel (PLAVIX) 75 MG tablet Take 75 mg by mouth daily with breakfast.      dicyclomine (BENTYL) 10 MG capsule Take 10 mg by mouth daily.     docusate sodium (COLACE) 100 MG capsule Take 100 mg by mouth 2 (two) times daily.     guaifenesin (ROBITUSSIN) 100 MG/5ML syrup Take 200 mg by mouth every 6 (six) hours as needed for cough.     hydrALAZINE (APRESOLINE) 100 MG tablet Take 1 tablet (100 mg total) by mouth 3 (three) times daily. 270 tablet 3   levETIRAcetam (KEPPRA) 750 MG tablet Take 750 mg by mouth 2 (two) times daily.     levothyroxine (SYNTHROID, LEVOTHROID) 25 MCG tablet Take 1 tablet by mouth daily.     loperamide (IMODIUM) 2 MG capsule Take by mouth as needed for diarrhea or loose stools.     loratadine (CLARITIN) 10 MG tablet Take 1 tablet (10 mg total) by mouth daily. 30 tablet 1   magnesium hydroxide (MILK OF MAGNESIA) 400 MG/5ML suspension Take 30 mLs by mouth at bedtime as needed for mild constipation.     melatonin 3 MG TABS tablet Take 3 mg by mouth at bedtime.     mometasone (NASONEX) 50 MCG/ACT nasal spray      neomycin-bacitracin-polymyxin  (NEOSPORIN) ointment Apply 1 application. topically as needed for wound care.     Polyethyl Glycol-Propyl Glycol (SYSTANE ULTRA) 0.4-0.3 % SOLN Apply 1 drop to eye 3 (three) times daily.     polyethylene glycol powder (GLYCOLAX/MIRALAX) powder Take 17 g by mouth 2 (two) times daily. Use for constipation, as prescribed, until daily soft stools  OTC 119 g 0   potassium chloride SA (KLOR-CON M) 20 MEQ tablet Take 2 tablets (40 mEq total) by mouth daily. 180 tablet 2   predniSONE (DELTASONE) 10 MG tablet 4 X 2 DAYS, 3 X 2 DAYS, 2 X 2 DAYS, 1 X 2 DAYS 20 tablet 0   sertraline (ZOLOFT) 100 MG tablet Take 100 mg by mouth daily with breakfast.      tamsulosin (FLOMAX) 0.4 MG CAPS capsule Take 1 capsule (0.4 mg total) by mouth daily. 30 capsule 0   Torsemide 60 MG TABS Take 40 mg in the morning and 20 mg in the afternoon. 84 tablet 3   traZODone (DESYREL) 100 MG tablet Take 100 mg by mouth at bedtime.     vitamin B-12 (CYANOCOBALAMIN) 1000 MCG tablet Take 1,000 mcg by mouth daily.     No facility-administered medications prior to visit.   Review of Systems  Constitutional:  Negative for chills, fever, malaise/fatigue and weight loss.  HENT:  Negative for congestion, sinus pain and sore throat.   Eyes: Negative.   Respiratory:  Positive for shortness of breath. Negative for cough, hemoptysis, sputum production and wheezing.   Cardiovascular:  Negative for chest pain, palpitations, orthopnea, claudication and leg swelling.  Gastrointestinal:  Negative for abdominal pain, heartburn, nausea and vomiting.  Genitourinary: Negative.   Musculoskeletal:  Negative for joint pain and myalgias.  Skin:  Negative for rash.  Neurological:  Negative for weakness.  Endo/Heme/Allergies: Negative.   Psychiatric/Behavioral: Negative.      Objective:   Vitals:   07/15/23 1515  BP: 124/74  Pulse: 94  SpO2: 95%  Weight: 170 lb 6.4 oz (77.3 kg)  Height: 5\' 10"  (1.778 m)   Physical Exam Constitutional:       General: He is not in acute distress.    Comments: Nasal canula in place, POC  HENT:     Head: Normocephalic and atraumatic.  Cardiovascular:     Rate and Rhythm: Normal rate and regular rhythm.     Pulses: Normal pulses.     Heart sounds: Normal heart sounds. No murmur heard. Pulmonary:     Effort: Pulmonary effort is normal.     Breath sounds: Decreased breath sounds present. No wheezing, rhonchi or rales.  Musculoskeletal:     Right lower leg: No edema.     Left lower leg: No edema.  Skin:    General: Skin is warm and dry.  Neurological:     General: No focal deficit present.     Mental Status: He is alert.     CBC    Component Value Date/Time   WBC 10.8 (H) 01/30/2022 1100   RBC 4.98 01/30/2022 1100   HGB 14.3 01/30/2022 1100   HGB 14.7 07/24/2021 1113   HCT 41.8 01/30/2022 1100   HCT 43.7 07/24/2021 1113   PLT 220.0 01/30/2022 1100   PLT 204 07/24/2021 1113   MCV 83.9 01/30/2022 1100   MCV 85 07/24/2021 1113   MCH 28.4 07/24/2021 1113   MCH 28.6 08/15/2019 0545   MCHC 34.2 01/30/2022 1100   RDW 14.3 01/30/2022 1100   RDW 13.5 07/24/2021 1113   LYMPHSABS 1.5 01/30/2022 1100   LYMPHSABS 1.8 07/24/2021 1113   MONOABS 0.8 01/30/2022 1100   EOSABS 0.2 01/30/2022 1100   EOSABS 0.2 07/24/2021 1113   BASOSABS 0.0 01/30/2022 1100   BASOSABS 0.1 07/24/2021 1113      Latest Ref Rng & Units 05/04/2022    2:18 PM 04/02/2022    2:12 PM 11/19/2021    4:15 PM  BMP  Glucose 70 - 99 mg/dL 80  63  98   BUN 8 - 27 mg/dL 11  15  13    Creatinine 0.76 - 1.27 mg/dL 1.61  0.96  0.45   BUN/Creat Ratio 10 - 24 10  14  13    Sodium 134 - 144 mmol/L 140  142  140   Potassium 3.5 - 5.2 mmol/L 3.1  3.2  3.9   Chloride 96 - 106 mmol/L 98  94  96   CO2 20 - 29 mmol/L 30  31  29    Calcium 8.6 - 10.2 mg/dL 8.5  9.2  9.2    Chest imaging: CT Chest 08/15/21 Mediastinum/Nodes: No pathologically enlarged mediastinal or hilar lymph nodes. Please note that accurate exclusion of hilar  adenopathy is limited on noncontrast CT scans. Esophagus is unremarkable in appearance. No axillary lymphadenopathy.   Lungs/Pleura: No suspicious appearing pulmonary nodules or masses are noted. No acute consolidative airspace disease. Small right pleural effusion lying dependently with pleural thickening and some pleural calcifications. Trace amount of left-sided pleural thickening as well, also with a small amount of pleural calcifications in the left hemithorax. Mild diffuse bronchial wall thickening with very mild centrilobular and paraseptal emphysema.    CXR 08/13/19 Single frontal view of the chest demonstrates postsurgical changes from median sternotomy. The cardiac silhouette is stable. There is chronic central vascular congestion and diffuse interstitial prominence. No airspace disease, effusion, or pneumothorax.  CTA Chest 04/16/16 Mediastinum/Nodes: No mediastinal lymphadenopathy. There is no  hilar lymphadenopathy. Borderline lymphadenopathy is identified in the right hilum. The esophagus has normal imaging features. There is no axillary lymphadenopathy.   Lungs/Pleura: Emphysema noted bilaterally with interlobular septal thickening and bronchial wall thickening. Dependent collapse/consolidation is identified in both lower lobes and small bilateral pleural effusions are evident.  PFT:    Latest Ref Rng & Units 12/24/2021   10:55 AM  PFT Results  FVC-Pre L 2.84   FVC-Predicted Pre % 70   FVC-Post L 2.77   FVC-Predicted Post % 69   Pre FEV1/FVC % % 62   Post FEV1/FCV % % 62   FEV1-Pre L 1.75   FEV1-Predicted Pre % 59   FEV1-Post L 1.72   DLCO uncorrected ml/min/mmHg 8.89   DLCO UNC% % 37   DLCO corrected ml/min/mmHg 8.89   DLCO COR %Predicted % 37   DLVA Predicted % 51   TLC L 4.27   TLC % Predicted % 66   RV % Predicted % 59    Labs:  Path:  Echo:  Heart Catheterization:  Assessment & Plan:   Pulmonary emphysema, unspecified emphysema type  (HCC)  Chronic hypoxemic respiratory failure (HCC)  Discussion: Daniel Oconnell is a 68 year old male, former smoker with hypertension, coronary artery disease s/p CABG and CVA who returns to pulmonary clinic for COPD and chronic respiratory failure.   Chronic Obstructive Pulmonary Disease (COPD) Improved symptoms with Prednisone. Currently on Breztri (2 puffs twice a day) and Albuterol (once a day). Discussed the importance of controlled breathing techniques and the role of each medication in managing COPD. -Continue Breztri and Albuterol as prescribed. -Encourage controlled breathing techniques, especially during exertion.  Plan for follow-up in six months.  Melody Comas, MD [facility] Office: [phone]    Current Outpatient Medications:    acetaminophen (TYLENOL) 500 MG tablet, Take 500 mg by mouth every 4 (four) hours as needed for mild pain, moderate pain, fever or headache., Disp: , Rfl:    albuterol (PROVENTIL) (2.5 MG/3ML) 0.083% nebulizer solution, Take 3 mLs (2.5 mg total) by nebulization every 4 (four) hours as needed for wheezing or shortness of breath., Disp: 75 mL, Rfl: 0   albuterol (VENTOLIN HFA) 108 (90 Base) MCG/ACT inhaler, Inhale 1-2 puffs into the lungs every 6 (six) hours as needed for wheezing or shortness of breath., Disp: 8 g, Rfl: 6   alum & mag hydroxide-simeth (MAALOX/MYLANTA) 200-200-20 MG/5ML suspension, Take 30 mLs by mouth as needed for indigestion or heartburn., Disp: , Rfl:    atorvastatin (LIPITOR) 80 MG tablet, Take 80 mg by mouth at bedtime. , Disp: , Rfl:    baclofen (LIORESAL) 20 MG tablet, Take 20 mg by mouth 3 (three) times daily., Disp: , Rfl:    benzonatate (TESSALON) 100 MG capsule, Take 1 capsule (100 mg total) by mouth every 8 (eight) hours., Disp: 21 capsule, Rfl: 0   BREZTRI AEROSPHERE 160-9-4.8 MCG/ACT AERO, Inhale into the lungs., Disp: , Rfl:    clopidogrel (PLAVIX) 75 MG tablet, Take 75 mg by mouth daily with  breakfast. , Disp: , Rfl:    dicyclomine (BENTYL) 10 MG capsule, Take 10 mg by mouth daily., Disp: , Rfl:    docusate sodium (COLACE) 100 MG capsule, Take 100 mg by mouth 2 (two) times daily., Disp: , Rfl:    guaifenesin (ROBITUSSIN) 100 MG/5ML syrup, Take 200 mg by mouth every 6 (six) hours as needed for cough., Disp: , Rfl:    hydrALAZINE (APRESOLINE) 100 MG tablet, Take  1 tablet (100 mg total) by mouth 3 (three) times daily., Disp: 270 tablet, Rfl: 3   levETIRAcetam (KEPPRA) 750 MG tablet, Take 750 mg by mouth 2 (two) times daily., Disp: , Rfl:    levothyroxine (SYNTHROID, LEVOTHROID) 25 MCG tablet, Take 1 tablet by mouth daily., Disp: , Rfl:    loperamide (IMODIUM) 2 MG capsule, Take by mouth as needed for diarrhea or loose stools., Disp: , Rfl:    loratadine (CLARITIN) 10 MG tablet, Take 1 tablet (10 mg total) by mouth daily., Disp: 30 tablet, Rfl: 1   magnesium hydroxide (MILK OF MAGNESIA) 400 MG/5ML suspension, Take 30 mLs by mouth at bedtime as needed for mild constipation., Disp: , Rfl:    melatonin 3 MG TABS tablet, Take 3 mg by mouth at bedtime., Disp: , Rfl:    mometasone (NASONEX) 50 MCG/ACT nasal spray, , Disp: , Rfl:    neomycin-bacitracin-polymyxin (NEOSPORIN) ointment, Apply 1 application. topically as needed for wound care., Disp: , Rfl:    Polyethyl Glycol-Propyl Glycol (SYSTANE ULTRA) 0.4-0.3 % SOLN, Apply 1 drop to eye 3 (three) times daily., Disp: , Rfl:    polyethylene glycol powder (GLYCOLAX/MIRALAX) powder, Take 17 g by mouth 2 (two) times daily. Use for constipation, as prescribed, until daily soft stools  OTC, Disp: 119 g, Rfl: 0   potassium chloride SA (KLOR-CON M) 20 MEQ tablet, Take 2 tablets (40 mEq total) by mouth daily., Disp: 180 tablet, Rfl: 2   predniSONE (DELTASONE) 10 MG tablet, 4 X 2 DAYS, 3 X 2 DAYS, 2 X 2 DAYS, 1 X 2 DAYS, Disp: 20 tablet, Rfl: 0   sertraline (ZOLOFT) 100 MG tablet, Take 100 mg by mouth daily with breakfast. , Disp: , Rfl:    tamsulosin  (FLOMAX) 0.4 MG CAPS capsule, Take 1 capsule (0.4 mg total) by mouth daily., Disp: 30 capsule, Rfl: 0   Torsemide 60 MG TABS, Take 40 mg in the morning and 20 mg in the afternoon., Disp: 84 tablet, Rfl: 3   traZODone (DESYREL) 100 MG tablet, Take 100 mg by mouth at bedtime., Disp: , Rfl:    vitamin B-12 (CYANOCOBALAMIN) 1000 MCG tablet, Take 1,000 mcg by mouth daily., Disp: , Rfl:

## 2023-07-28 NOTE — Progress Notes (Unsigned)
 Cardiology Office Note:   Date:  07/29/2023  ID:  Daniel Oconnell, DOB February 05, 1956, MRN [id number] PCP: Uvaldo Bristle, PA-C  [facility] Providers Cardiologist:  Rollene Rotunda, MD {  History of Present Illness:   Daniel Oconnell is a 68 y.o. male who presents for follow-up of coronary disease. He had a history of bypass surgery in 2001 at [place]. He did have a stroke in 2001 as well.  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 He had carotid stenting.  He is chronically on 3 L oxygen.  He gets around in a wheelchair with residual hemiparesis from his stroke.  He did have COVID in August 2020.   He was admitted in March 2021 with COPD exacerbation.  He lives at a nursing home.  He does walk although he has right hemiparesis.   At the last visit he had increased dyspnea and I added Lasix.   His EF was normal on echo.   BNP was elevated.  PYP did not suggest amyloid.  He has 3 liters of O2 that he uses during exercise.    He did have flu and some GI bug recently.  He is getting over this.  He was going to the [facility].  He was holding off while he was sick but he is getting get back to this.  He actually does relatively well.  The patient denies any new symptoms such as chest discomfort, neck or arm discomfort. There has been no new shortness of breath, PND or orthopnea. There have been no reported palpitations, presyncope or syncope.    ROS: As stated in the HPI and negative for all other systems.  Studies Reviewed:    EKG:   EKG Interpretation Date/Time:  Thursday July 29 2023 13:44:19 EDT Ventricular Rate:  83 PR Interval:  180 QRS Duration:  144 QT Interval:  460 QTC Calculation: 540 R Axis:   3  Text Interpretation: Normal sinus rhythm Right bundle branch block Inferior infarct , age undetermined When compared with EKG of 10/23 No significant change since last tracing Confirmed by Rollene Rotunda ([id number]) on 07/29/2023 1:48:59 PM    Risk Assessment/Calculations:               Physical Exam:   VS:  BP 108/60 (BP Location: Left Arm, Patient Position: Sitting, Cuff Size: Normal)   Pulse 83   Ht 5\' 10"  (1.778 m)   Wt 170 lb (77.1 kg)   SpO2 92%   BMI 24.39 kg/m    Wt Readings from Last 3 Encounters:  07/29/23 170 lb (77.1 kg)  07/15/23 170 lb 6.4 oz (77.3 kg)  07/05/23 172 lb 6.4 oz (78.2 kg)     GEN: Well nourished, well developed in no acute distress NECK: No JVD; No carotid bruits CARDIAC: RRR, no murmurs, rubs, gallops RESPIRATORY:  Clear to auscultation without rales, wheezing or rhonchi  ABDOMEN: Soft, non-tender, non-distended EXTREMITIES:  No edema; No deformity, dense hemiparesis  ASSESSMENT AND PLAN:   CAD:   The patient has no new sypmtoms.  No further cardiovascular testing is indicated.  We will continue with aggressive risk reduction and meds as listed.   HTN:  The blood pressure is at target.  No change in therapy.   CVA:   He has a history of carotid stenting.  He will continue with risk reduction.    SOB:    He has chronic O2 dependent respiratory failure but he seems to be euvolemic.  I will be checking a basic metabolic profile.  For now he will continue the meds as listed.   DYSLIPIDEMIA:   LDL was 59 the last time it was checked but I do not see any labs since March 2023.  I will have him come back for fasting lipid profile and LP(a).  RBBB:  This is chronic.  No change in therapy.        Follow up with me in 1 year  Signed, Rollene Rotunda, MD

## 2023-07-29 ENCOUNTER — Ambulatory Visit: Payer: Medicare HMO | Attending: Cardiology | Admitting: Cardiology

## 2023-07-29 ENCOUNTER — Encounter: Payer: Self-pay | Admitting: Cardiology

## 2023-07-29 VITALS — BP 108/60 | HR 83 | Ht 70.0 in | Wt 170.0 lb

## 2023-07-29 DIAGNOSIS — I10 Essential (primary) hypertension: Secondary | ICD-10-CM

## 2023-07-29 DIAGNOSIS — R0602 Shortness of breath: Secondary | ICD-10-CM

## 2023-07-29 DIAGNOSIS — I2510 Atherosclerotic heart disease of native coronary artery without angina pectoris: Secondary | ICD-10-CM

## 2023-07-29 DIAGNOSIS — Z8673 Personal history of transient ischemic attack (TIA), and cerebral infarction without residual deficits: Secondary | ICD-10-CM

## 2023-07-29 DIAGNOSIS — E785 Hyperlipidemia, unspecified: Secondary | ICD-10-CM | POA: Diagnosis not present

## 2023-07-29 MED ORDER — POTASSIUM CHLORIDE CRYS ER 20 MEQ PO TBCR: 60.0000 meq | EXTENDED_RELEASE_TABLET | Freq: Every day | ORAL | 2 refills | Status: DC

## 2023-07-29 NOTE — Patient Instructions (Signed)
 Medication Instructions:  No changes. *If you need a refill on your cardiac medications before your next appointment, please call your pharmacy*   Lab Work: Fasting Lipid, Lpa, BMET due anytime. If you have labs (blood work) drawn today and your tests are completely normal, you will receive your results only by: MyChart Message (if you have MyChart) OR A paper copy in the mail If you have any lab test that is abnormal or we need to change your treatment, we will call you to review the results.    Follow-Up: At [facility], you and your health needs are our priority.  As part of our continuing mission to provide you with exceptional heart care, we have created designated Provider Care Teams.  These Care Teams include your primary Cardiologist (physician) and Advanced Practice Providers (APPs -  Physician Assistants and Nurse Practitioners) who all work together to provide you with the care you need, when you need it.  We recommend signing up for the patient portal called "MyChart".  Sign up information is provided on this After Visit Summary.  MyChart is used to connect with patients for Virtual Visits (Telemedicine).  Patients are able to view lab/test results, encounter notes, upcoming appointments, etc.  Non-urgent messages can be sent to your provider as well.   To learn more about what you can do with MyChart, go to [email or web address].    Your next appointment:   1 year(s)  Provider:   Rollene Rotunda, MD     Other Instructions

## 2023-07-30 ENCOUNTER — Other Ambulatory Visit: Payer: Self-pay

## 2023-07-30 DIAGNOSIS — E785 Hyperlipidemia, unspecified: Secondary | ICD-10-CM

## 2023-08-01 LAB — LIPID PANEL
Chol/HDL Ratio: 3.3 ratio (ref 0.0–5.0)
Cholesterol, Total: 123 mg/dL (ref 100–199)
HDL: 37 mg/dL — ABNORMAL LOW (ref >39)
LDL Chol Calc (NIH): 67 mg/dL (ref 0–99)
Triglycerides: 101 mg/dL (ref 0–149)
VLDL Cholesterol Cal: 19 mg/dL (ref 5–40)

## 2023-08-01 LAB — LIPOPROTEIN A (LPA): Lipoprotein (a): 57.7 nmol/L (ref <75.0)

## 2023-08-01 LAB — BASIC METABOLIC PANEL
BUN/Creatinine Ratio: 10 (ref 10–24)
BUN: 10 mg/dL (ref 8–27)
CO2: 28 mmol/L (ref 20–29)
Calcium: 9 mg/dL (ref 8.6–10.2)
Chloride: 97 mmol/L (ref 96–106)
Creatinine, Ser: 1.02 mg/dL (ref 0.76–1.27)
Glucose: 83 mg/dL (ref 70–99)
Potassium: 3.3 mmol/L — ABNORMAL LOW (ref 3.5–5.2)
Sodium: 140 mmol/L (ref 134–144)
eGFR: 79 mL/min/{1.73_m2} (ref >59)

## 2023-08-02 ENCOUNTER — Encounter: Payer: Self-pay | Admitting: *Deleted

## 2023-08-02 DIAGNOSIS — E876 Hypokalemia: Secondary | ICD-10-CM

## 2023-08-04 ENCOUNTER — Other Ambulatory Visit: Payer: Self-pay

## 2023-08-04 DIAGNOSIS — E876 Hypokalemia: Secondary | ICD-10-CM

## 2023-08-09 ENCOUNTER — Telehealth: Payer: Self-pay | Admitting: Cardiology

## 2023-08-09 NOTE — Telephone Encounter (Signed)
 Pt's spouse is calling regarding them wanting to know if pt needs to fast or not before doing potassium labs. Please advise?

## 2023-08-26 ENCOUNTER — Encounter: Payer: Self-pay | Admitting: Acute Care

## 2023-08-27 ENCOUNTER — Other Ambulatory Visit: Payer: Self-pay | Admitting: Emergency Medicine

## 2023-08-27 ENCOUNTER — Telehealth: Payer: Self-pay

## 2023-08-27 ENCOUNTER — Encounter: Payer: Self-pay | Admitting: Acute Care

## 2023-08-27 DIAGNOSIS — Z87891 Personal history of nicotine dependence: Secondary | ICD-10-CM

## 2023-08-27 DIAGNOSIS — Z122 Encounter for screening for malignant neoplasm of respiratory organs: Secondary | ICD-10-CM

## 2023-08-27 NOTE — Telephone Encounter (Signed)
 Copied from CRM [id number]. Topic: General - Call Back - No Documentation >> Aug 26, 2023  9:55 AM Brennan Bailey S wrote: Reason for CRM: patient wife called in to check to see why patient was given a call, no documentation, please call patient back.   Called and spoke with Darl Pikes [facility]) Claims they received phone call from our office and she deleted message and was unable to listen. Pts wife thinks it was to schedule annual CT lung cancer screening scan. Darl Pikes would like to get that scheduled at earliest convenience. Routing to [facility] & [facility] to see if anyone tried calling pt.

## 2023-08-27 NOTE — Telephone Encounter (Signed)
 I called the patient to schedule LDCT I didn't have a updated order yesterday when I called had to get a nurse to put one in for me it has been documented on now but I did leve my name and number

## 2023-08-27 NOTE — Telephone Encounter (Signed)
 Notified pt's wife she was contacted to schedule LDCT. Pt & wife would like you to call again to schedule it. Thank you!!

## 2023-08-27 NOTE — Telephone Encounter (Signed)
 I have called 08/26/23 vm full unable to leave message so I sent a letter I have called 2 x 08/27/23 and left message to call my direct # back

## 2023-08-30 NOTE — Telephone Encounter (Signed)
 I have got the patient scheduled with the wife

## 2023-09-03 ENCOUNTER — Ambulatory Visit (HOSPITAL_BASED_OUTPATIENT_CLINIC_OR_DEPARTMENT_OTHER)
Admission: RE | Admit: 2023-09-03 | Discharge: 2023-09-03 | Disposition: A | Discharge status: Home / Self Care | Source: Ambulatory Visit | Attending: Cardiology | Admitting: Cardiology

## 2023-09-03 DIAGNOSIS — Z122 Encounter for screening for malignant neoplasm of respiratory organs: Secondary | ICD-10-CM | POA: Diagnosis present

## 2023-09-03 DIAGNOSIS — Z87891 Personal history of nicotine dependence: Secondary | ICD-10-CM | POA: Diagnosis present

## 2023-09-23 ENCOUNTER — Telehealth: Payer: Self-pay

## 2023-09-23 NOTE — Telephone Encounter (Signed)
 Copied from CRM [id number]. Topic: General - Other >> Sep 23, 2023 11:14 AM Evie Hoff wrote: Reason for CRM: carmen harris calling from [employer] there  Our clinical area Production designer, theatre/television/film . she wants his oxygen  order to be clarified how many lpms and when like is it continuos and how is like do he use the nasal cannula or mask  They can fax the order to  Attention carmen  [phone] fax number  [phone] ask for carmen or jaime    Spoke w/ Carolyn Cisco @ [facility] the Rx in question was not Rx by our MD, so they are requesting that info from the other MD.   No further action required.

## 2023-10-01 LAB — LAB REPORT - SCANNED: EGFR: 94.7

## 2023-10-04 ENCOUNTER — Other Ambulatory Visit: Payer: Self-pay

## 2023-10-04 DIAGNOSIS — Z122 Encounter for screening for malignant neoplasm of respiratory organs: Secondary | ICD-10-CM

## 2023-10-04 DIAGNOSIS — Z87891 Personal history of nicotine dependence: Secondary | ICD-10-CM

## 2023-10-13 ENCOUNTER — Telehealth: Payer: Self-pay | Admitting: Cardiology

## 2023-10-13 NOTE — Telephone Encounter (Signed)
 Left voice message per DPR letting pt and wife know that we have received pt's lab results and they are waiting for Dr. Lavonne Prairie in his mailbox. Call back number was left on message should they have any questions.

## 2023-10-13 NOTE — Telephone Encounter (Signed)
 Patient's relative Aithan Farrelly) dropped by lab work for [name] - I am placing in Hochrein's box later this afternoon for the Dr's review.  Aven Christen states patient is in assisted living - Please call to confirm receipt of this document - [phone].

## 2023-10-13 NOTE — Telephone Encounter (Signed)
 Message sent to Dr.Hochrein's RN.

## 2023-10-14 NOTE — Telephone Encounter (Signed)
 Lab results from mailbox brought to Dr. Lavonne Prairie 5/29

## 2023-10-16 LAB — LAB REPORT - SCANNED: EGFR: 93.9

## 2023-10-21 ENCOUNTER — Ambulatory Visit: Payer: Self-pay | Admitting: Cardiology

## 2023-10-26 NOTE — Telephone Encounter (Signed)
 Daniel Oconnell is returning phone call in regard to lab results.

## 2023-10-27 ENCOUNTER — Telehealth: Payer: Self-pay | Admitting: Cardiology

## 2023-10-27 NOTE — Telephone Encounter (Signed)
 Left voice message to call back 6/11

## 2023-10-27 NOTE — Telephone Encounter (Signed)
 Pt is returning call to a nurse for results

## 2023-10-28 ENCOUNTER — Other Ambulatory Visit: Payer: Self-pay

## 2023-10-28 MED ORDER — POTASSIUM CHLORIDE CRYS ER 20 MEQ PO TBCR: EXTENDED_RELEASE_TABLET | ORAL | 3 refills | Status: AC

## 2023-10-28 NOTE — Telephone Encounter (Signed)
 Pt requesting a c/b in regards to results. Please advise

## 2023-10-28 NOTE — Telephone Encounter (Signed)
  Isobel Marie, RN 10/27/2023  5:07 PM EDT Back to Top    Left voice message to call back 6/11   Willard Harman Glenn, [place] 10/26/2023  5:19 PM EDT     Attempted to call pt's wife back, unable to reach over the phone. LMTCB.   Isobel Marie, RN 10/25/2023  2:31 PM EDT     Left voice message to call back 6/9   Eilleen Grates, MD 10/23/2023 10:11 AM EDT     Please make sure he has a repeat BMET.    Perhaps this week.   I would prefer they have it at a [facility] so that it comes directly to me.   Eilleen Grates, MD 10/21/2023  7:47 AM EDT     Please check with the patient and his wife.  I think we responded to this and already increased the potassium and he should have had his dose increased and have repeat labs pending.      Spoke with the pt's wife and she reports that there has not been any new lab work drawn and no adjustments- to her knowledge- have been made to his Potassium.  Need orders faxed over to the Assisted Living:  [facility]  [address] [place], [place] [address] Phone# [phone] Fax # [phone]

## 2023-10-28 NOTE — Telephone Encounter (Signed)
 Please refer to Order Only note from 6/12

## 2023-10-28 NOTE — Progress Notes (Signed)
 Spoke with pt's wife (per DPR) regarding Dr. Atlas Lea suggestions to increase the pt's potassium to 40 mg in the AM, 40 mg in the afternoon and 20 mg in the evening (100 in total) and have a BMET in 2 weeks after increasing the dosage. A lab order and order for the prescription were faxed to [facility] facility. Wife verbalized understanding. All questions if any were answered.

## 2023-12-02 LAB — LAB REPORT - SCANNED: EGFR (Non-African Amer.): 89.2

## 2023-12-19 ENCOUNTER — Ambulatory Visit: Payer: Self-pay | Admitting: Cardiology

## 2024-01-06 ENCOUNTER — Ambulatory Visit: Admitting: Pulmonary Disease

## 2024-01-06 ENCOUNTER — Encounter: Payer: Self-pay | Admitting: Pulmonary Disease

## 2024-01-06 VITALS — BP 117/68 | HR 82 | Ht 70.0 in | Wt 180.0 lb

## 2024-01-06 DIAGNOSIS — J439 Emphysema, unspecified: Secondary | ICD-10-CM

## 2024-01-06 MED ORDER — ALBUTEROL SULFATE (2.5 MG/3ML) 0.083% IN NEBU: 2.5000 mg | INHALATION_SOLUTION | RESPIRATORY_TRACT | 11 refills | Status: AC | PRN

## 2024-01-06 MED ORDER — BREZTRI AEROSPHERE 160-9-4.8 MCG/ACT IN AERO: 2.0000 | INHALATION_SPRAY | Freq: Two times a day (BID) | RESPIRATORY_TRACT | 11 refills | Status: AC

## 2024-01-06 NOTE — Progress Notes (Signed)
 Synopsis: Referred in March 2023 for COPD by Harvest Hu, PA  Subjective:   PATIENT ID: Daniel Oconnell GENDER: male DOB: [birth year], MRN: [id number]  HPI  Chief Complaint  Patient presents with   Medical Management of Chronic Issues    Due to weather up to 5L    Daniel Oconnell is a 68 year old male, former smoker with hypertension, coronary artery disease s/p CABG and CVA who returns to pulmonary clinic for COPD.   He has been doing well since last visit. He remains on breztri  2 puffs twice daily. He is using albuterol  as needed. No need for prednisone  since last visit. Does complain of decreased dyspnea over recent summer months and is turning up oxygen  concentrator to 5L for relief with out checking SpO2.   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    Hyperlipidemia    Hypertension      Family History  Problem Relation Age of Onset   CVA Mother 59   Aneurysm Father      Social History   Socioeconomic History   Marital status: Divorced    Spouse name: Not on file   Number of children: 2   Years of education: Not on file   Highest education level: Not on file  Occupational History   Occupation: Disabled    Comment: Licensed conveyancer  Tobacco Use   Smoking status: Former    Current packs/day: 0.00    Average packs/day: 1 pack/day for 28.0 years (28.0 ttl pk-yrs)    Types: Cigarettes    Start date: 59    Quit date: 2013    Years since quitting: 12.6   Smokeless tobacco: Never  Substance and Sexual Activity   Alcohol  use: No    Comment: h/o heavy ETOH use   Drug use: No    Types: Cocaine, Marijuana    Comment: h/o drug use   Sexual activity: Not Currently  Other  Topics Concern   Not on file  Social History Narrative   Not on file   Social Drivers of Health   Financial Resource Strain: Not on file  Food Insecurity: No Food Insecurity (12/09/2020)   Received from [facility]   Hunger Vital Sign    Within the past 12 months, you worried that your food would run out before you got the money to buy more.: Never true    Within the past 12 months, the food you bought just didn't last and you didn't have money to get more.: Never true  Transportation Needs: Not on file  Physical Activity: Not on file  Stress: Not on file  Social Connections: Unknown (09/17/2021)   Received from [facility]   Social Network    Social Network: Not on file  Intimate Partner Violence: Unknown (08/18/2021)   Received from [facility]   HITS    Physically Hurt: Not on file    Insult or Talk Down To: Not on file    Threaten Physical Harm: Not on file    Scream or Curse: Not on file     Allergies  Allergen Reactions   Clonidine Diarrhea    Not listed on [facility]  Sulfa Antibiotics Other (See Comments)    Unknown; not listed on MAR     Outpatient Medications Prior to Visit  Medication Sig Dispense Refill   acetaminophen  (TYLENOL ) 500 MG tablet Take 500 mg by mouth every 4 (four) hours as needed for mild pain, moderate pain, fever or headache.     albuterol  (VENTOLIN  HFA) 108 (90 Base) MCG/ACT inhaler Inhale 1-2 puffs into the lungs every 6 (six) hours as needed for wheezing or shortness of breath. 8 g 6   alum & mag hydroxide-simeth (MAALOX/MYLANTA) 200-200-20 MG/5ML suspension Take 30 mLs by mouth as needed for indigestion or heartburn.     atorvastatin  (LIPITOR) 80 MG tablet Take 80 mg by mouth at bedtime.      baclofen  (LIORESAL ) 20 MG tablet Take 20 mg by mouth 3 (three) times daily.     benzonatate  (TESSALON ) 100 MG capsule Take 1 capsule (100 mg total) by mouth every 8 (eight) hours. 21 capsule 0   clopidogrel  (PLAVIX ) 75 MG tablet Take 75 mg by mouth daily  with breakfast.      dicyclomine  (BENTYL ) 10 MG capsule Take 10 mg by mouth daily.     docusate sodium  (COLACE) 100 MG capsule Take 100 mg by mouth 2 (two) times daily.     guaifenesin (ROBITUSSIN) 100 MG/5ML syrup Take 200 mg by mouth every 6 (six) hours as needed for cough.     hydrALAZINE  (APRESOLINE ) 100 MG tablet Take 1 tablet (100 mg total) by mouth 3 (three) times daily. 270 tablet 3   ketoconazole (NIZORAL) 2 % cream SMARTSIG:1 Application Topical 1 to 2 Times Daily     levETIRAcetam  (KEPPRA ) 750 MG tablet Take 750 mg by mouth 2 (two) times daily.     levothyroxine  (SYNTHROID , LEVOTHROID) 25 MCG tablet Take 1 tablet by mouth daily.     loperamide (IMODIUM) 2 MG capsule Take by mouth as needed for diarrhea or loose stools.     loratadine  (CLARITIN ) 10 MG tablet Take 1 tablet (10 mg total) by mouth daily. 30 tablet 1   magnesium  hydroxide (MILK OF MAGNESIA) 400 MG/5ML suspension Take 30 mLs by mouth at bedtime as needed for mild constipation.     melatonin 3 MG TABS tablet Take 3 mg by mouth at bedtime.     mometasone (NASONEX) 50 MCG/ACT nasal spray      mupirocin ointment (BACTROBAN) 2 % Apply topically 2 (two) times daily.     neomycin-bacitracin-polymyxin (NEOSPORIN) ointment Apply 1 application. topically as needed for wound care.     Polyethyl Glycol-Propyl Glycol (SYSTANE ULTRA) 0.4-0.3 % SOLN Apply 1 drop to eye 3 (three) times daily.     polyethylene glycol powder (GLYCOLAX /MIRALAX ) powder Take 17 g by mouth 2 (two) times daily. Use for constipation, as prescribed, until daily soft stools  OTC 119 g 0   Potassium Chloride  ER 20 MEQ TBCR Take by mouth.     potassium chloride  SA (KLOR-CON  M) 20 MEQ tablet Take 2 tablets (40 mEq total) by mouth 2 (two) times daily with breakfast and lunch AND 1 tablet (20 mEq total) every evening. Take 40 mg in the morning, 40 mg in the afternoon and 20 mg in the evening. 225 tablet 3   sertraline  (ZOLOFT ) 100 MG tablet Take 100 mg by mouth daily  with breakfast.      tamsulosin  (FLOMAX ) 0.4 MG CAPS capsule Take 1 capsule (0.4 mg total) by mouth daily. 30 capsule 0   Torsemide  60 MG TABS Take 40 mg  in the morning and 20 mg in the afternoon. 84 tablet 3   traZODone  (DESYREL ) 100 MG tablet Take 100 mg by mouth at bedtime.     vitamin B-12 (CYANOCOBALAMIN) 1000 MCG tablet Take 1,000 mcg by mouth daily.     albuterol  (PROVENTIL ) (2.5 MG/3ML) 0.083% nebulizer solution Take 3 mLs (2.5 mg total) by nebulization every 4 (four) hours as needed for wheezing or shortness of breath. 75 mL 0   BREZTRI  AEROSPHERE 160-9-4.8 MCG/ACT AERO Inhale into the lungs.     No facility-administered medications prior to visit.   Review of Systems  Constitutional:  Negative for chills, fever, malaise/fatigue and weight loss.  HENT:  Negative for congestion, sinus pain and sore throat.   Eyes: Negative.   Respiratory:  Positive for shortness of breath. Negative for cough, hemoptysis, sputum production and wheezing.   Cardiovascular:  Negative for chest pain, palpitations, orthopnea, claudication and leg swelling.  Gastrointestinal:  Negative for abdominal pain, heartburn, nausea and vomiting.  Genitourinary: Negative.   Musculoskeletal:  Negative for joint pain and myalgias.  Skin:  Negative for rash.  Neurological:  Negative for weakness.  Endo/Heme/Allergies: Negative.   Psychiatric/Behavioral: Negative.      Objective:   Vitals:   01/06/24 1414  BP: 117/68  Pulse: 82  SpO2: 92%  Weight: 180 lb (81.6 kg)  Height: 5' 10 (1.778 m)  PF: (!) 3 L/min    Physical Exam Constitutional:      General: He is not in acute distress.    Comments: Nasal canula in place, POC  HENT:     Head: Normocephalic and atraumatic.  Cardiovascular:     Rate and Rhythm: Normal rate and regular rhythm.     Pulses: Normal pulses.     Heart sounds: Normal heart sounds. No murmur heard. Pulmonary:     Effort: Pulmonary effort is normal.     Breath sounds: No wheezing,  rhonchi or rales.  Musculoskeletal:     Right lower leg: No edema.     Left lower leg: No edema.  Skin:    General: Skin is warm and dry.  Neurological:     General: No focal deficit present.     Mental Status: He is alert.     CBC    Component Value Date/Time   WBC 10.8 (H) 01/30/2022 1100   RBC 4.98 01/30/2022 1100   HGB 14.3 01/30/2022 1100   HGB 14.7 07/24/2021 1113   HCT 41.8 01/30/2022 1100   HCT 43.7 07/24/2021 1113   PLT 220.0 01/30/2022 1100   PLT 204 07/24/2021 1113   MCV 83.9 01/30/2022 1100   MCV 85 07/24/2021 1113   MCH 28.4 07/24/2021 1113   MCH 28.6 08/15/2019 0545   MCHC 34.2 01/30/2022 1100   RDW 14.3 01/30/2022 1100   RDW 13.5 07/24/2021 1113   LYMPHSABS 1.5 01/30/2022 1100   LYMPHSABS 1.8 07/24/2021 1113   MONOABS 0.8 01/30/2022 1100   EOSABS 0.2 01/30/2022 1100   EOSABS 0.2 07/24/2021 1113   BASOSABS 0.0 01/30/2022 1100   BASOSABS 0.1 07/24/2021 1113      Latest Ref Rng & Units 07/30/2023    9:19 AM 05/04/2022    2:18 PM 04/02/2022    2:12 PM  BMP  Glucose 70 - 99 mg/dL 83  80  63   BUN 8 - 27 mg/dL 10  11  15    Creatinine 0.76 - 1.27 mg/dL 8.97  8.93  8.91   BUN/Creat Ratio 10 - 24  10  10  14    Sodium 134 - 144 mmol/L 140  140  142   Potassium 3.5 - 5.2 mmol/L 3.3  3.1  3.2   Chloride 96 - 106 mmol/L 97  98  94   CO2 20 - 29 mmol/L 28  30  31    Calcium  8.6 - 10.2 mg/dL 9.0  8.5  9.2    Chest imaging: LCS CT Chest 09/03/23 1. Lung-RADS 2, benign appearance or behavior. Continue annual screening with low-dose chest CT without contrast in 12 months. 2. Aortic Atherosclerosis (ICD10-I70.0) and Emphysema (ICD10-J43.9).  PFT:    Latest Ref Rng & Units 12/24/2021   10:55 AM  PFT Results  FVC-Pre L 2.84   FVC-Predicted Pre % 70   FVC-Post L 2.77   FVC-Predicted Post % 69   Pre FEV1/FVC % % 62   Post FEV1/FCV % % 62   FEV1-Pre L 1.75   FEV1-Predicted Pre % 59   FEV1-Post L 1.72   DLCO uncorrected ml/min/mmHg 8.89   DLCO UNC% % 37    DLCO corrected ml/min/mmHg 8.89   DLCO COR %Predicted % 37   DLVA Predicted % 51   TLC L 4.27   TLC % Predicted % 66   RV % Predicted % 59    Labs:  Path:  Echo:  Heart Catheterization:  Assessment & Plan:   Pulmonary emphysema, unspecified emphysema type (HCC) - Plan: albuterol  (PROVENTIL ) (2.5 MG/3ML) 0.083% nebulizer solution, BREZTRI  AEROSPHERE 160-9-4.8 MCG/ACT AERO inhaler  Discussion: Daniel Oconnell is a 68 year old male, former smoker with hypertension, coronary artery disease s/p CABG and CVA who returns to pulmonary clinic for COPD and chronic respiratory failure.   Chronic Obstructive Pulmonary Disease (COPD) -Continue Breztri  and Albuterol  as prescribed. - Use albuterol  inhaler as needed - consider adding ohtuvayre  nebs next -Encourage controlled breathing techniques, especially during exertion.  Hypoxemic Respiratory Failure - Continue supplemental oxygen  3-5L pulsed via POC  Plan for follow-up in six months.  Dorn Chill, MD [facility] Office: [phone]    Current Outpatient Medications:    acetaminophen  (TYLENOL ) 500 MG tablet, Take 500 mg by mouth every 4 (four) hours as needed for mild pain, moderate pain, fever or headache., Disp: , Rfl:    albuterol  (VENTOLIN  HFA) 108 (90 Base) MCG/ACT inhaler, Inhale 1-2 puffs into the lungs every 6 (six) hours as needed for wheezing or shortness of breath., Disp: 8 g, Rfl: 6   alum & mag hydroxide-simeth (MAALOX/MYLANTA) 200-200-20 MG/5ML suspension, Take 30 mLs by mouth as needed for indigestion or heartburn., Disp: , Rfl:    atorvastatin  (LIPITOR) 80 MG tablet, Take 80 mg by mouth at bedtime. , Disp: , Rfl:    baclofen  (LIORESAL ) 20 MG tablet, Take 20 mg by mouth 3 (three) times daily., Disp: , Rfl:    benzonatate  (TESSALON ) 100 MG capsule, Take 1 capsule (100 mg total) by mouth every 8 (eight) hours., Disp: 21 capsule, Rfl: 0   clopidogrel  (PLAVIX ) 75 MG tablet, Take 75 mg by mouth daily  with breakfast. , Disp: , Rfl:    dicyclomine  (BENTYL ) 10 MG capsule, Take 10 mg by mouth daily., Disp: , Rfl:    docusate sodium  (COLACE) 100 MG capsule, Take 100 mg by mouth 2 (two) times daily., Disp: , Rfl:    guaifenesin (ROBITUSSIN) 100 MG/5ML syrup, Take 200 mg by mouth every 6 (six) hours as needed for cough., Disp: , Rfl:    hydrALAZINE  (APRESOLINE ) 100 MG tablet,  Take 1 tablet (100 mg total) by mouth 3 (three) times daily., Disp: 270 tablet, Rfl: 3   ketoconazole (NIZORAL) 2 % cream, SMARTSIG:1 Application Topical 1 to 2 Times Daily, Disp: , Rfl:    levETIRAcetam  (KEPPRA ) 750 MG tablet, Take 750 mg by mouth 2 (two) times daily., Disp: , Rfl:    levothyroxine  (SYNTHROID , LEVOTHROID) 25 MCG tablet, Take 1 tablet by mouth daily., Disp: , Rfl:    loperamide (IMODIUM) 2 MG capsule, Take by mouth as needed for diarrhea or loose stools., Disp: , Rfl:    loratadine  (CLARITIN ) 10 MG tablet, Take 1 tablet (10 mg total) by mouth daily., Disp: 30 tablet, Rfl: 1   magnesium  hydroxide (MILK OF MAGNESIA) 400 MG/5ML suspension, Take 30 mLs by mouth at bedtime as needed for mild constipation., Disp: , Rfl:    melatonin 3 MG TABS tablet, Take 3 mg by mouth at bedtime., Disp: , Rfl:    mometasone (NASONEX) 50 MCG/ACT nasal spray, , Disp: , Rfl:    mupirocin ointment (BACTROBAN) 2 %, Apply topically 2 (two) times daily., Disp: , Rfl:    neomycin-bacitracin-polymyxin (NEOSPORIN) ointment, Apply 1 application. topically as needed for wound care., Disp: , Rfl:    Polyethyl Glycol-Propyl Glycol (SYSTANE ULTRA) 0.4-0.3 % SOLN, Apply 1 drop to eye 3 (three) times daily., Disp: , Rfl:    polyethylene glycol powder (GLYCOLAX /MIRALAX ) powder, Take 17 g by mouth 2 (two) times daily. Use for constipation, as prescribed, until daily soft stools  OTC, Disp: 119 g, Rfl: 0   Potassium Chloride  ER 20 MEQ TBCR, Take by mouth., Disp: , Rfl:    potassium chloride  SA (KLOR-CON  M) 20 MEQ tablet, Take 2 tablets (40 mEq total) by  mouth 2 (two) times daily with breakfast and lunch AND 1 tablet (20 mEq total) every evening. Take 40 mg in the morning, 40 mg in the afternoon and 20 mg in the evening., Disp: 225 tablet, Rfl: 3   sertraline  (ZOLOFT ) 100 MG tablet, Take 100 mg by mouth daily with breakfast. , Disp: , Rfl:    tamsulosin  (FLOMAX ) 0.4 MG CAPS capsule, Take 1 capsule (0.4 mg total) by mouth daily., Disp: 30 capsule, Rfl: 0   Torsemide  60 MG TABS, Take 40 mg in the morning and 20 mg in the afternoon., Disp: 84 tablet, Rfl: 3   traZODone  (DESYREL ) 100 MG tablet, Take 100 mg by mouth at bedtime., Disp: , Rfl:    vitamin B-12 (CYANOCOBALAMIN) 1000 MCG tablet, Take 1,000 mcg by mouth daily., Disp: , Rfl:    albuterol  (PROVENTIL ) (2.5 MG/3ML) 0.083% nebulizer solution, Take 3 mLs (2.5 mg total) by nebulization every 4 (four) hours as needed for wheezing or shortness of breath., Disp: 75 mL, Rfl: 11   BREZTRI  AEROSPHERE 160-9-4.8 MCG/ACT AERO inhaler, Inhale 2 puffs into the lungs 2 (two) times daily., Disp: 10.7 g, Rfl: 11

## 2024-01-06 NOTE — Patient Instructions (Addendum)
 Continue Breztri  inhaler 2 puffs twice daily - rinse mouth out after each use    Continue albuterol  inhaler 1-2 puffs every 4-6 hours as needed  Next CT Chest scan in April 2026 for lung cancer screening  Follow up in 6 months

## 2024-02-21 ENCOUNTER — Telehealth: Payer: Self-pay

## 2024-02-21 NOTE — Telephone Encounter (Signed)
 Copied from CRM [id number]. Topic: Clinical - Medical Advice >> Feb 21, 2024  9:20 AM Nathanel DEL wrote: Reason for CRM: pt' wife Devere calling to ask does she need an appt in order to have pts orders changed in his chart?   The asst living facility he lives in gave her a list of things that need to be updated.   The state is coming in and all needs to be perfect .  they told her his are not. She doesn't mind making an appt to see the dr and go over these things, or a phone call is OK too.SABRA  Pt was just seen 8/21 and his and his next appt should be in Feb.  Please advise

## 2024-02-21 NOTE — Telephone Encounter (Signed)
 Tried to reach out to patient wife VM - box full.   Unsure of what would need to be changed, need more info before making an appointment

## 2024-02-22 ENCOUNTER — Telehealth: Payer: Self-pay

## 2024-02-22 NOTE — Telephone Encounter (Signed)
 Copied from CRM [id number]. Topic: General - Other >> Feb 22, 2024 12:40 PM Ismael A wrote: Reason for CRM: patient's wife, Jae, calling back - requesting to write on nursing orders: pts canula for in-room concentrator and portable unit need to be changed every two weeks/ per [employer], request Dr. Kara to write orders  Please advise Burnard

## 2024-02-22 NOTE — Telephone Encounter (Signed)
 Tried to reach out to [place] from [facility] to get some clarifications.

## 2024-02-22 NOTE — Telephone Encounter (Signed)
 Copied from CRM #[id number]. Topic: General - Other >> Feb 21, 2024  2:06 PM Joesph PARAS wrote: Reason for CRM: Patient spouse is requesting a call back from nurse. Declined to provide more info to PAS due to volume of request.   Spok w/ PT wife okay DPR-    We will try and make request notes from [facility] and fax to nurse manger [place]

## 2024-02-23 NOTE — Telephone Encounter (Signed)
 Tried to reach out to [employer] again , VM/ [employer] and spoke with Mrs. Berish okay per DPR what the living facility is requesting is more for the PCP to address , since they would be main POC.   - NFN

## 2024-05-03 ENCOUNTER — Other Ambulatory Visit: Payer: Self-pay

## 2024-05-03 ENCOUNTER — Encounter: Payer: Self-pay | Admitting: Occupational Therapy

## 2024-05-03 ENCOUNTER — Ambulatory Visit: Admitting: Occupational Therapy

## 2024-05-03 DIAGNOSIS — I69851 Hemiplegia and hemiparesis following other cerebrovascular disease affecting right dominant side: Secondary | ICD-10-CM | POA: Diagnosis present

## 2024-05-03 DIAGNOSIS — M25641 Stiffness of right hand, not elsewhere classified: Secondary | ICD-10-CM | POA: Diagnosis present

## 2024-05-03 NOTE — Therapy (Unsigned)
 OUTPATIENT OCCUPATIONAL THERAPY NEURO EVALUATION  Patient Name: Daniel Oconnell MRN: [id number] DOB:Dec 02, 1955, 68 y.o., male Today's Date: 05/03/2024  PCP: Daniel Burden, PA-C  REFERRING PROVIDER: Tilford Burden, PA-C  END OF SESSION:  OT End of Session - 05/03/24 1331     Visit Number 1    Number of Visits 4    Date for Recertification  06/02/24    Authorization Type [employer]    OT Start Time 1021    OT Stop Time 1152    OT Time Calculation (min) 91 min    Activity Tolerance Patient tolerated treatment well    Behavior During Therapy Flat affect;Restless          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    Hyperlipidemia    Hypertension    Past Surgical History:  Procedure Laterality Date   APPENDECTOMY     CORONARY ARTERY BYPASS GRAFT  2001   LUMBAR SPINE SURGERY     Patient Active Problem List   Diagnosis Date Noted   Pain due to onychomycosis of toenails of both feet 03/17/2023   COPD, severe (HCC) 12/24/2021   ILD (interstitial lung disease) (HCC) 12/24/2021   Centrilobular emphysema (HCC) 11/11/2021   Chronic respiratory failure with hypoxia (HCC) 11/11/2021   Diastolic CHF (HCC) 11/11/2021   DOE (dyspnea on exertion) 08/13/2021   Community acquired bacterial pneumonia 08/13/2019   History of seizure 08/13/2019   OSA (obstructive sleep apnea) 08/13/2019   Cerebrovascular accident (CVA) (HCC) 02/24/2019   Dyslipidemia 02/23/2019   Leg swelling 03/10/2017   Medication management 03/10/2017   Hyperlipidemia 03/10/2017   Adrenal abnormality    Expressive aphasia    Acute respiratory failure with hypoxia (HCC) 04/17/2016   Abdominal pain, acute,  right lower quadrant 04/17/2016   Nonspecific abnormal electrocardiogram (ECG) (EKG) 04/17/2016   H/O: CVA (cerebrovascular accident) 04/17/2016   COPD with acute exacerbation (HCC) 04/17/2016   CAD (coronary artery disease) 04/17/2016   Essential hypertension 04/17/2016   Right lower quadrant abdominal pain    HCAP (healthcare-associated pneumonia) 04/16/2016    ONSET DATE: 04/26/2024 (Date of referral)  REFERRING DIAG: I69.851 Spastic hemiplegia of right dominant side as late effect of other cerebrovascular disease  THERAPY DIAG:  Spastic hemiplegia of right dominant side as late effect of other cerebrovascular disease (HCC)  Stiffness of right hand, not elsewhere classified  Rationale for Evaluation and Treatment: Rehabilitation  SUBJECTIVE:   SUBJECTIVE STATEMENT: He wants to replace the resting hand splint that has developed a crack at the thumb. He is resistant to change and is unsure about a prefab though his wife likes the idea of being able to wash the cover and further promote aeration of the pt's skin. Daniel Oconnell is living in an ALF and has been seeing ST at [facility] in [place].   Pt accompanied by: significant other  PERTINENT HISTORY: PMH: COPD, CAD, apraxia, expressive aphasia, homonymous hemianopsia, HLD, HTN, DOE, and CHF To evaluate and treat with new splint as indicated. Current splint had crack in it and is falling. Right sided hemiplegia, s/p stroke  PRECAUTIONS: Fall and Other: aphasic; on 3 L O2  WEIGHT BEARING RESTRICTIONS: No  PAIN:  Are you having pain? No  FALLS: Has patient fallen in last 6 months? No recent falls reported  LIVING ENVIRONMENT: Lives with: lives in an assisted living facility Lives in: House/apartment Stairs: No Has following equipment at home: portable O2; Cane  PLOF: Needs assistance with ADLs and Needs assistance with homemaking  PATIENT GOALS: replace resting hand splint  OBJECTIVE:  Note: Objective measures were completed  at Evaluation unless otherwise noted.  HAND DOMINANCE: Lt handed since stroke (Rt handed prior to stroke)   ADLs: Overall ADLs: min to mod A  IADLs:            Overall IADLs: mod A  MOBILITY STATUS: Needs Assist: CGA  ACTIVITY TOLERANCE: Activity tolerance: poor  UPPER EXTREMITY ROM:   RUE: ~90* shoulder flexion, elbow ROM WNL; active digit flexion and extension (ataxic) with contracture of 4th digit PIP, lacks active wrist extension LUE: WNL  UPPER EXTREMITY MMT:   NT  HAND FUNCTION: No functional use Rt hand except RUE as stabilizer against body  COORDINATION: No use Rt hand  SENSATION: Light touch: Impaired through RUE  EDEMA: none reported or observed  MUSCLE TONE: RUE: Moderate  COGNITION: Overall cognitive status: History of cognitive impairments - at baseline  PERCEPTION: Not tested  PRAXIS: Not tested  OBSERVATIONS: Pt ambulates with use of AD and CGA. No loss of balance. The pt appears well kept.                                                                                                                             TREATMENT:    - Self-care/home management completed for duration as noted below including: OT educated pt on rehabilitation process and results of objective measures in relation to pt specific goals. Discussed options for splinting including prefab vs fabricated and pros and cons to both options. Pt opting to have fab splint made today with potential to get prefab as a secondary option.   - initial splinting   Therapist fabricated resting hand splint for improved positioning, management of spasticity, skin integrity, and joint integrity of affected extremity following CVA. R hand was placed in neutral wrist extension, digits in slight flexion, and thumb in slight palmar abduction. Reinforcement piece added to outside of resting hand splint to include the thumb, and adapted straps for wrist and fingers were made. Wearing instructions discussed  but will be reviewed during next session and signs of skin breakdown will be monitored during future sessions to assure appropriate fit.    Prior resting hand adjustments ***  PATIENT EDUCATION: Education details: OT role and POC; splinting Person educated: Patient and Spouse Education method: Explanation, Demonstration, and Verbal cues Education comprehension: verbalized understanding and needs further education  HOME EXERCISE PROGRAM: N/A for this visit   GOALS:  SHORT TERM GOALS: Target date: 06/02/24   *** Baseline: Goal status: INITIAL  2.  *** Baseline:  Goal status: INITIAL  3.  *** Baseline:  Goal status:  INITIAL  4.  *** Baseline:  Goal status: INITIAL  5.  *** Baseline:  Goal status: INITIAL  6.  *** Baseline:  Goal status: INITIAL  ASSESSMENT:  CLINICAL IMPRESSION: Patient is a 68 y.o. male who was seen today for occupational therapy evaluation for management of RUE spasticity. Hx includes COPD, CAD, apraxia, expressive aphasia, homonymous hemianopsia, HLD, HTN, DOE, and CHF. Pt would benefit from skilled OT services in the outpatient setting to provide education with regards to spasticity management. As part of spasticity management and to improve joint integrity, pt may benefit from resting hand splint wear at night. Recommend prefabricated splint given chronicity of CVA, ability to wash outside cover, ability to adjust as needed, and for pt comfort. Pt is accustomed to custom fabricated splint and requests this until prefab can be obtained.   PERFORMANCE DEFICITS: in functional skills including ADLs, IADLs, coordination, dexterity, sensation, tone, muscle spasms, Fine motor control, Gross motor control, decreased knowledge of precautions, decreased knowledge of use of DME, skin integrity, and UE functional use,.   IMPAIRMENTS: are limiting patient from ADLs and IADLs.   CO-MORBIDITIES: may have co-morbidities  that affects occupational performance.  Patient will benefit from skilled OT to address above impairments and improve overall function.  MODIFICATION OR ASSISTANCE TO COMPLETE EVALUATION: Min-Moderate modification of tasks or assist with assess necessary to complete an evaluation.  OT OCCUPATIONAL PROFILE AND HISTORY: Detailed assessment: Review of records and additional review of physical, cognitive, psychosocial history related to current functional performance.  CLINICAL DECISION MAKING: Moderate - several treatment options, min-mod task modification necessary  REHAB POTENTIAL: Good  EVALUATION COMPLEXITY: Moderate    PLAN:  OT FREQUENCY: 1x/week  OT DURATION: up to 3 additional sessions  PLANNED INTERVENTIONS: 97168 OT Re-evaluation, 97535 self care/ADL training, 02889 therapeutic exercise, 97530 therapeutic activity, 97112 neuromuscular re-education, 97140 manual therapy, 97010 moist heat, 97760 Orthotic Initial, 97763 Orthotic/Prosthetic subsequent, coping strategies training, patient/family education, and DME and/or AE instructions  RECOMMENDED OTHER SERVICES: N/A for this visit  CONSULTED AND AGREED WITH PLAN OF CARE: Patient and spouse  PLAN FOR NEXT SESSION: adjust newly fabricated resting hand splint; RMI?   Jocelyn CHRISTELLA Bottom, OT 05/03/2024, 3:53 PM

## 2024-05-10 ENCOUNTER — Ambulatory Visit: Admitting: Occupational Therapy

## 2024-05-10 DIAGNOSIS — M25641 Stiffness of right hand, not elsewhere classified: Secondary | ICD-10-CM

## 2024-05-10 DIAGNOSIS — I69851 Hemiplegia and hemiparesis following other cerebrovascular disease affecting right dominant side: Secondary | ICD-10-CM

## 2024-05-10 NOTE — Patient Instructions (Signed)
 [id number]&dib_[email or web address]&keywords=RMI%2Bresting%2Bhand%2Bsplint%2Bbrace&[phone]&sprefix=rmi%2Bresting%2Bhand%2Bsplint%2Bbrace%2Caps%2C85&sr=[email or web address]-spons&sp_csd=[id number]&th=1

## 2024-05-10 NOTE — Therapy (Signed)
 " OUTPATIENT OCCUPATIONAL THERAPY NEURO TREATMENT  Patient Name: Daniel Oconnell MRN: [id number] DOB:04/06/1956, 68 y.o., male Today's Date: 05/10/2024  PCP: Tilford Burden, PA-C  REFERRING PROVIDER: Tilford Burden, PA-C  END OF SESSION:  OT End of Session - 05/10/24 1317     Visit Number 2    Number of Visits 4    Date for Recertification  06/02/24    Authorization Type [employer]    OT Start Time 1021    OT Stop Time 1108    OT Time Calculation (min) 47 min    Activity Tolerance Patient tolerated treatment well    Behavior During Therapy Flat affect;Restless          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    Hyperlipidemia    Hypertension    Past Surgical History:  Procedure Laterality Date   APPENDECTOMY     CORONARY ARTERY BYPASS GRAFT  2001   LUMBAR SPINE SURGERY     Patient Active Problem List   Diagnosis Date Noted   Pain due to onychomycosis of toenails of both feet 03/17/2023   COPD, severe (HCC) 12/24/2021   ILD (interstitial lung disease) (HCC) 12/24/2021   Centrilobular emphysema (HCC) 11/11/2021   Chronic respiratory failure with hypoxia (HCC) 11/11/2021   Diastolic CHF (HCC) 11/11/2021   DOE (dyspnea on exertion) 08/13/2021   Community acquired bacterial pneumonia 08/13/2019   History of seizure 08/13/2019   OSA (obstructive sleep apnea) 08/13/2019   Cerebrovascular accident (CVA) (HCC) 02/24/2019   Dyslipidemia 02/23/2019   Leg swelling 03/10/2017   Medication management 03/10/2017   Hyperlipidemia 03/10/2017   Adrenal abnormality    Expressive aphasia    Acute respiratory failure with hypoxia (HCC) 04/17/2016   Abdominal pain, acute,  right lower quadrant 04/17/2016   Nonspecific abnormal electrocardiogram (ECG) (EKG) 04/17/2016   H/O: CVA (cerebrovascular accident) 04/17/2016   COPD with acute exacerbation (HCC) 04/17/2016   CAD (coronary artery disease) 04/17/2016   Essential hypertension 04/17/2016   Right lower quadrant abdominal pain    HCAP (healthcare-associated pneumonia) 04/16/2016    ONSET DATE: 04/26/2024 (Date of referral)  REFERRING DIAG: I69.851 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   Pt accompanied by: significant other  PERTINENT HISTORY: PMH: COPD, CAD, apraxia, expressive aphasia, homonymous hemianopsia, HLD, HTN, DOE, and CHF To evaluate and treat with new splint as indicated. Current splint had crack in it and is falling. Right sided hemiplegia, s/p stroke  PRECAUTIONS: Fall and Other: aphasic; on 3 L O2  WEIGHT BEARING RESTRICTIONS: No  PAIN:  Are you having pain? No  FALLS: Has patient fallen in last 6 months? No recent falls reported  LIVING ENVIRONMENT: Lives with: lives in an assisted living facility Lives in: House/apartment Stairs: No Has following equipment at home: portable O2; Cane  PLOF: Needs assistance  with ADLs and Needs assistance with homemaking  PATIENT GOALS: replace resting hand splint  OBJECTIVE:  Note: Objective measures were completed at Evaluation unless otherwise noted.  HAND DOMINANCE: Lt handed since stroke (Rt handed prior to stroke)   ADLs: Overall ADLs: min to mod A  IADLs:            Overall IADLs: mod A  MOBILITY STATUS: Needs Assist: CGA  ACTIVITY  TOLERANCE: Activity tolerance: poor  UPPER EXTREMITY ROM:   RUE: ~90* shoulder flexion, elbow ROM WNL; active digit flexion and extension (ataxic) with contracture of 4th digit PIP, lacks active wrist extension LUE: WNL  UPPER EXTREMITY MMT:   NT  HAND FUNCTION: No functional use Rt hand except RUE as stabilizer against body  COORDINATION: No use Rt hand  SENSATION: Light touch: Impaired through RUE  EDEMA: none reported or observed  MUSCLE TONE: RUE: Moderate  COGNITION: Overall cognitive status: History of cognitive impairments - at baseline  PERCEPTION: Not tested  PRAXIS: Not tested  OBSERVATIONS: Pt ambulates with use of AD and CGA. No loss of balance. The pt appears well kept.                                                                                                                             TREATMENT:    - Self-care/home management completed for duration as noted below including: OT educated pt and spouse on how to obtain prefabricated R, size large RMI resting hand splint as needed. They were encouraged to obtain online or call their insurance to explore purchasing options.   - subsequent splinting   Therapist adjusted previously fabricated Eazyform resting hand splint for improved positioning, management of spasticity, skin integrity, and joint integrity of affected extremity following CVA. Wearing instructions discussed and pt demonstrated ability to don and doff independently. OT placed finger separators into pan of splint to help with skin integrity and to promote more ideal positioning.   OT made adjustments to prior resting hand splint in efforts to temporarily repair broken brace. Eazyform patch reheated over palmar aspect of thumb to improve seal and cracked place was able to be heated to create seal.   PATIENT EDUCATION: Education details: splinting Person educated: Patient and Spouse Education method: Explanation, Demonstration, and Verbal  cues Education comprehension: verbalized understanding and needs further education  HOME EXERCISE PROGRAM: N/A for this visit  GOALS:  SHORT TERM GOALS: Target date: 06/02/24   Pt will be independent with splint wear and care for R resting hand splint.  Baseline: Goal status: INITIAL  2.  Pt to be fitted with custom fabricated resting hand splint for management of spasticity.  Baseline:  Goal status: IN PROGRESS  3.  Pt's caregiver will independently recall at least 3 ways to manage spasticity in RUE as needed to improve pain and overall function.   Baseline:  Goal status: INITIAL  ASSESSMENT:  CLINICAL IMPRESSION: Patient is a 68 y.o. male who was  seen today for occupational therapy evaluation for management of RUE spasticity. Good understanding of fabricated R resting hand splint today including doffing and donning. Recommend additional visit to assure appropriate fit.   PERFORMANCE DEFICITS: in functional skills including ADLs, IADLs, coordination, dexterity, sensation, tone, muscle spasms, Fine motor control, Gross motor control, decreased knowledge of precautions, decreased knowledge of use of DME, skin integrity, and UE functional use,.   IMPAIRMENTS: are limiting patient from ADLs and IADLs.   CO-MORBIDITIES: may have co-morbidities  that affects occupational performance. Patient will benefit from skilled OT to address above impairments and improve overall function.  REHAB POTENTIAL: Good  PLAN:  OT FREQUENCY: 1x/week  OT DURATION: up to 3 additional sessions  PLANNED INTERVENTIONS: 97168 OT Re-evaluation, 97535 self care/ADL training, 02889 therapeutic exercise, 97530 therapeutic activity, 97112 neuromuscular re-education, 97140 manual therapy, 97010 moist heat, 97760 Orthotic Initial, 97763 Orthotic/Prosthetic subsequent, coping strategies training, patient/family education, and DME and/or AE instructions  RECOMMENDED OTHER SERVICES: N/A for this visit  CONSULTED  AND AGREED WITH PLAN OF CARE: Patient and spouse  PLAN FOR NEXT SESSION: adjust newly fabricated resting hand splint PRN; d/c   Jocelyn CHRISTELLA Bottom, OT 05/10/2024, 1:18 PM           "

## 2024-05-31 ENCOUNTER — Ambulatory Visit: Attending: Physician Assistant | Admitting: Occupational Therapy

## 2024-05-31 DIAGNOSIS — M25641 Stiffness of right hand, not elsewhere classified: Secondary | ICD-10-CM | POA: Diagnosis present

## 2024-05-31 DIAGNOSIS — I69851 Hemiplegia and hemiparesis following other cerebrovascular disease affecting right dominant side: Secondary | ICD-10-CM | POA: Insufficient documentation

## 2024-05-31 NOTE — Patient Instructions (Addendum)
 Ways to Manage Spasticity Heat  Deep pressure  Stretching  Use of a splint at night  Weight-bearing (leaning into your arm/hand with your own body weight)  NMES unit  Botox (injections as prescribed by a physician)  Muscle Relaxers (medications as prescribed by a physician)  Spasticity Management After Stroke: Botox Treatment Patient Information Handout  What Is Spasticity? After a stroke, some muscles may become tight, stiff, or hard to control. This is known as spasticity and can cause:  Difficulty opening the hand Tight elbow or curled fingers Stiff leg or pointed toes Pain or discomfort Challenges with daily tasks  Botox is one option that can help reduce this muscle tightness.  How Botox Helps Botox (botulinum toxin type A) is injected into muscles that are overactive. It works by:  Relaxing tight muscles Reducing pain from stiffness Improving range of motion Helping you move more comfortably Supporting progress in therapy  Botox does not affect the rest of your body or healthy muscles.  What to Expect Before Your Injection  Your provider identifies which muscles are tight. They may use ultrasound or EMG to guide injections.  During the Procedure  Small injections are placed directly in the tight muscles. You may feel a quick pinch. The visit usually lasts 10-20 minutes.  After the Procedure  You can return to normal activities the same day. Mild soreness may occur for 1-2 days.   When Will I See Results?  3-7 days: first signs of improvement 2-4 weeks: best results 3-4 months: effects wear off gradually  Most patients repeat Botox every 12 weeks.  Botox + Therapy = Best Results Your therapist may use:  Stretching and range-of-motion exercises Strengthening of weaker muscles Splinting or positioning Functional practice for real-life tasks  Using your arm or leg regularly helps maintain gains.  Possible Side Effects (Usually  Mild)  Soreness or bruising Temporary weakness near the injection Flu-like feeling (rare)  Call your provider right away if you have trouble swallowing, breathing, or experience severe weakness.  How to Get the Most Out of Treatment  Stretch daily. Attend all therapy appointments. Use splints/braces as recommended. Track changes in movement or daily activities. Share feedback at your follow-up visit.   Questions or Concerns? Your therapy and medical team are here to help. Please reach out if you notice changes, want to discuss progress, or need support with daily activities.  [email or web address]

## 2024-05-31 NOTE — Therapy (Signed)
 " OUTPATIENT OCCUPATIONAL THERAPY NEURO TREATMENT and DISCHARGE  Patient Name: Daniel Oconnell MRN: [id number] DOB:07/02/1955, 69 y.o., male Today's Date: 05/31/2024  PCP: Tilford Burden, PA-C  REFERRING PROVIDER: Tilford Burden, PA-C  END OF SESSION:  OT End of Session - 05/31/24 1324     Visit Number 3    Number of Visits 4    Date for Recertification  06/02/24    Authorization Type [employer]    OT Start Time 1318    OT Stop Time 1357    OT Time Calculation (min) 39 min    Activity Tolerance Patient tolerated treatment well    Behavior During Therapy Flat affect;Restless          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    Hyperlipidemia    Hypertension    Past Surgical History:  Procedure Laterality Date   APPENDECTOMY     CORONARY ARTERY BYPASS GRAFT  2001   LUMBAR SPINE SURGERY     Patient Active Problem List   Diagnosis Date Noted   Pain due to onychomycosis of toenails of both feet 03/17/2023   COPD, severe (HCC) 12/24/2021   ILD (interstitial lung disease) (HCC) 12/24/2021   Centrilobular emphysema (HCC) 11/11/2021   Chronic respiratory failure with hypoxia (HCC) 11/11/2021   Diastolic CHF (HCC) 11/11/2021   DOE (dyspnea on exertion) 08/13/2021   Community acquired bacterial pneumonia 08/13/2019   History of seizure 08/13/2019   OSA (obstructive sleep apnea) 08/13/2019   Cerebrovascular accident (CVA) (HCC) 02/24/2019   Dyslipidemia 02/23/2019   Leg swelling 03/10/2017   Medication management 03/10/2017   Hyperlipidemia 03/10/2017   Adrenal abnormality    Expressive aphasia    Acute respiratory failure with hypoxia (HCC) 04/17/2016   Abdominal  pain, acute, right lower quadrant 04/17/2016   Nonspecific abnormal electrocardiogram (ECG) (EKG) 04/17/2016   H/O: CVA (cerebrovascular accident) 04/17/2016   COPD with acute exacerbation (HCC) 04/17/2016   CAD (coronary artery disease) 04/17/2016   Essential hypertension 04/17/2016   Right lower quadrant abdominal pain    HCAP (healthcare-associated pneumonia) 04/16/2016    ONSET DATE: 04/26/2024 (Date of referral)  REFERRING DIAG: I69.851 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   Pt accompanied by: significant other  PERTINENT HISTORY: PMH: COPD, CAD, apraxia, expressive aphasia, homonymous hemianopsia, HLD, HTN, DOE, and CHF To evaluate and treat with new splint as indicated. Current splint had crack in it and is falling. Right sided hemiplegia, s/p stroke  PRECAUTIONS: Fall and Other: aphasic; on 3 L O2  WEIGHT BEARING RESTRICTIONS: No  PAIN:  Are you having pain? No  FALLS: Has patient fallen in last 6 months? No recent falls reported  LIVING ENVIRONMENT: Lives with: lives in an assisted living facility Lives in: House/apartment Stairs: No Has following equipment at home: portable O2; Cane  PLOF:  Needs assistance with ADLs and Needs assistance with homemaking  PATIENT GOALS: replace resting hand splint  OBJECTIVE:  Note: Objective measures were completed at Evaluation unless otherwise noted.  HAND DOMINANCE: Lt handed since stroke (Rt handed prior to stroke)   ADLs: Overall ADLs: min to mod A  IADLs:            Overall IADLs: mod A  MOBILITY STATUS: Needs Assist:  CGA  ACTIVITY TOLERANCE: Activity tolerance: poor  UPPER EXTREMITY ROM:   RUE: ~90* shoulder flexion, elbow ROM WNL; active digit flexion and extension (ataxic) with contracture of 4th digit PIP, lacks active wrist extension LUE: WNL  UPPER EXTREMITY MMT:   NT  HAND FUNCTION: No functional use Rt hand except RUE as stabilizer against body  COORDINATION: No use Rt hand  SENSATION: Light touch: Impaired through RUE  EDEMA: none reported or observed  MUSCLE TONE: RUE: Moderate  COGNITION: Overall cognitive status: History of cognitive impairments - at baseline  PERCEPTION: Not tested  PRAXIS: Not tested  OBSERVATIONS: Pt ambulates with use of AD and CGA. No loss of balance. The pt appears well kept.                                                                                                                             TREATMENT:    - Self-care/home management completed for duration as noted below including:  Objective measures assessed as noted in Goals section to determine progression towards goals.  Therapist reviewed goals with patient and updated patient progression.  No additional functional limitations identified.   PATIENT EDUCATION: Education details: splinting Person educated: Patient and Spouse Education method: Explanation, Demonstration, and Verbal cues Education comprehension: verbalized understanding and needs further education  HOME EXERCISE PROGRAM: N/A for this visit  GOALS:  SHORT TERM GOALS: Target date: 06/02/24   Pt will be independent with splint wear and care for R resting hand splint.  Baseline: Goal status: MET  2.  Pt to be fitted with custom fabricated resting hand splint for management of spasticity.  Baseline:  Goal status: MET  3.  Pt's caregiver will independently recall at least 3 ways to manage spasticity in RUE as needed to improve pain and overall function.   Baseline:  Goal status: MET  ASSESSMENT:  OCCUPATIONAL  THERAPY DISCHARGE SUMMARY  Visits from Start of Care: 3  Current functional level related to goals / functional outcomes: Patient has met all goals to date.   Remaining deficits: Pt remains impacted by RUE spasticity but has strategies to manage symptoms and prevent further medical complications.    Education / Equipment: Continue with nightly use of R resting hand splint and strategies following OT d/c to maximize function, manage pain, manage spasticity, and promote joint integrity.  Consider referral for Botox consult and if Botox is received, return for therapy after to maximize benefit.  Patient agrees to discharge. Patient goals were met. Patient is being discharged due to  being pleased with the current functional level.SABRA  CLINICAL IMPRESSION: Patient demonstrates independence with splint wear and caregiver verbalizes good understanding of strategies to manage RUE spasticity. Patient is appropriate for discharge and no longer demonstrates medical necessity for continued skilled occupational therapy services.  PLAN:  OT D/C completed  CONSULTED AND AGREED WITH PLAN OF CARE: Patient and spouse   Jocelyn CHRISTELLA Bottom, OT 05/31/2024, 3:15 PM           "

## 2024-07-06 ENCOUNTER — Ambulatory Visit: Admitting: Pulmonary Disease
